# Patient Record
Sex: Female | Born: 1985
Health system: Southern US, Community
[De-identification: ages and names within clinical notes are randomized; demographics above are authoritative.]

## PROBLEM LIST (undated history)

## (undated) ENCOUNTER — Inpatient Hospital Stay (HOSPITAL_COMMUNITY): Payer: Self-pay

## (undated) DIAGNOSIS — O139 Gestational [pregnancy-induced] hypertension without significant proteinuria, unspecified trimester: Secondary | ICD-10-CM

## (undated) DIAGNOSIS — K219 Gastro-esophageal reflux disease without esophagitis: Secondary | ICD-10-CM

## (undated) DIAGNOSIS — R519 Headache, unspecified: Secondary | ICD-10-CM

## (undated) DIAGNOSIS — D649 Anemia, unspecified: Secondary | ICD-10-CM

## (undated) DIAGNOSIS — G43019 Migraine without aura, intractable, without status migrainosus: Secondary | ICD-10-CM

## (undated) DIAGNOSIS — J45909 Unspecified asthma, uncomplicated: Secondary | ICD-10-CM

## (undated) DIAGNOSIS — IMO0002 Reserved for concepts with insufficient information to code with codable children: Secondary | ICD-10-CM

## (undated) DIAGNOSIS — Z98891 History of uterine scar from previous surgery: Secondary | ICD-10-CM

## (undated) DIAGNOSIS — O24419 Gestational diabetes mellitus in pregnancy, unspecified control: Secondary | ICD-10-CM

## (undated) DIAGNOSIS — O1490 Unspecified pre-eclampsia, unspecified trimester: Secondary | ICD-10-CM

## (undated) DIAGNOSIS — M1990 Unspecified osteoarthritis, unspecified site: Secondary | ICD-10-CM

## (undated) DIAGNOSIS — E119 Type 2 diabetes mellitus without complications: Secondary | ICD-10-CM

## (undated) DIAGNOSIS — R51 Headache: Secondary | ICD-10-CM

## (undated) HISTORY — PX: WISDOM TOOTH EXTRACTION: SHX21

## (undated) HISTORY — DX: Migraine without aura, intractable, without status migrainosus: G43.019

## (undated) HISTORY — DX: Morbid (severe) obesity due to excess calories: E66.01

## (undated) HISTORY — PX: TOENAIL EXCISION: SUR558

## (undated) HISTORY — DX: Unspecified osteoarthritis, unspecified site: M19.90

## (undated) HISTORY — DX: Type 2 diabetes mellitus without complications: E11.9

---

## 2006-03-18 ENCOUNTER — Ambulatory Visit (HOSPITAL_COMMUNITY): Admission: RE | Admit: 2006-03-18 | Discharge: 2006-03-18 | Payer: Self-pay | Admitting: *Deleted

## 2006-04-29 ENCOUNTER — Ambulatory Visit (HOSPITAL_COMMUNITY): Admission: RE | Admit: 2006-04-29 | Discharge: 2006-04-29 | Payer: Self-pay | Admitting: *Deleted

## 2006-07-10 ENCOUNTER — Ambulatory Visit (HOSPITAL_COMMUNITY): Admission: RE | Admit: 2006-07-10 | Discharge: 2006-07-10 | Payer: Self-pay | Admitting: Family Medicine

## 2006-07-13 ENCOUNTER — Ambulatory Visit: Payer: Self-pay | Admitting: *Deleted

## 2006-07-13 ENCOUNTER — Inpatient Hospital Stay (HOSPITAL_COMMUNITY): Admission: AD | Admit: 2006-07-13 | Discharge: 2006-07-13 | Payer: Self-pay | Admitting: *Deleted

## 2006-07-23 ENCOUNTER — Ambulatory Visit: Payer: Self-pay | Admitting: Gynecology

## 2006-07-23 ENCOUNTER — Ambulatory Visit (HOSPITAL_COMMUNITY): Admission: RE | Admit: 2006-07-23 | Discharge: 2006-07-23 | Payer: Self-pay | Admitting: Obstetrics & Gynecology

## 2006-07-31 ENCOUNTER — Ambulatory Visit (HOSPITAL_COMMUNITY): Admission: RE | Admit: 2006-07-31 | Discharge: 2006-07-31 | Payer: Self-pay | Admitting: Gynecology

## 2006-07-31 ENCOUNTER — Ambulatory Visit: Payer: Self-pay | Admitting: Gynecology

## 2006-08-07 ENCOUNTER — Ambulatory Visit: Payer: Self-pay | Admitting: Obstetrics & Gynecology

## 2006-08-07 ENCOUNTER — Ambulatory Visit (HOSPITAL_COMMUNITY): Admission: RE | Admit: 2006-08-07 | Discharge: 2006-08-07 | Payer: Self-pay | Admitting: Obstetrics & Gynecology

## 2006-08-10 ENCOUNTER — Inpatient Hospital Stay (HOSPITAL_COMMUNITY): Admission: AD | Admit: 2006-08-10 | Discharge: 2006-08-15 | Payer: Self-pay | Admitting: Obstetrics & Gynecology

## 2006-08-10 ENCOUNTER — Ambulatory Visit: Payer: Self-pay | Admitting: Gynecology

## 2006-08-19 ENCOUNTER — Inpatient Hospital Stay (HOSPITAL_COMMUNITY): Admission: AD | Admit: 2006-08-19 | Discharge: 2006-08-19 | Payer: Self-pay | Admitting: Gynecology

## 2006-08-22 ENCOUNTER — Inpatient Hospital Stay (HOSPITAL_COMMUNITY): Admission: RE | Admit: 2006-08-22 | Discharge: 2006-08-22 | Payer: Self-pay | Admitting: Obstetrics and Gynecology

## 2006-08-28 ENCOUNTER — Ambulatory Visit: Payer: Self-pay | Admitting: Gynecology

## 2007-03-19 IMAGING — US US FETAL BPP W/O NONSTRESS
1 series · 14 of 21 positions shown · non-contrast
Comparison: none

CLINICAL DATA: Pregnancy induced hypertension.  Assess fetal well-being.

[Series 1: us fetal bpp w/o nonstress · 0.47mm/px · 21 acquisitions, 14 frames shown]
[im 1/21]
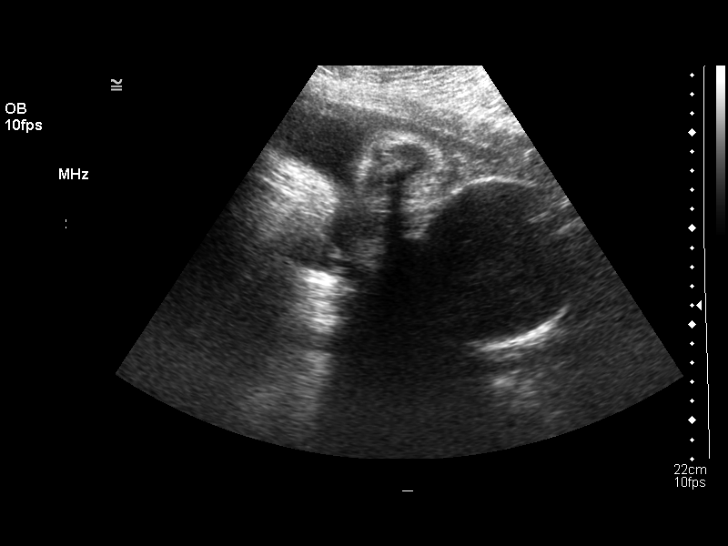
[im 3/21]
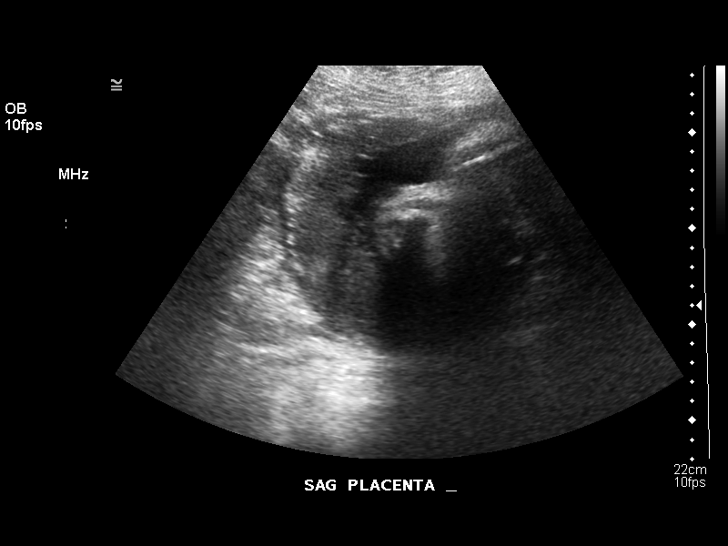
[im 4/21]
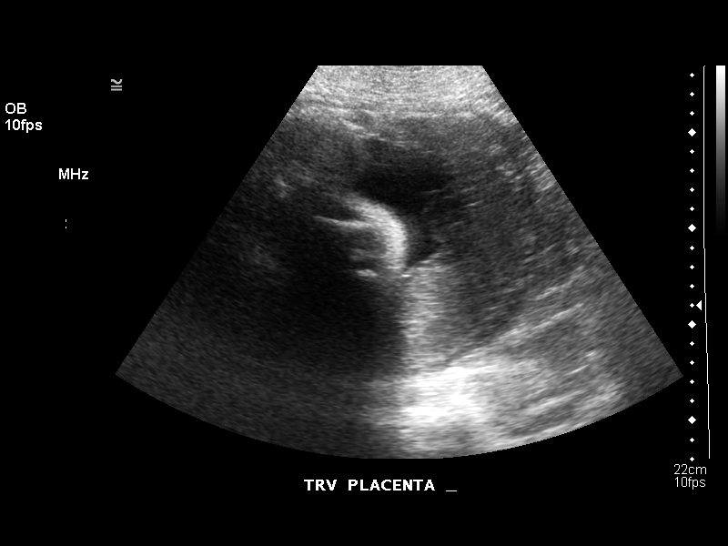
[im 6/21]
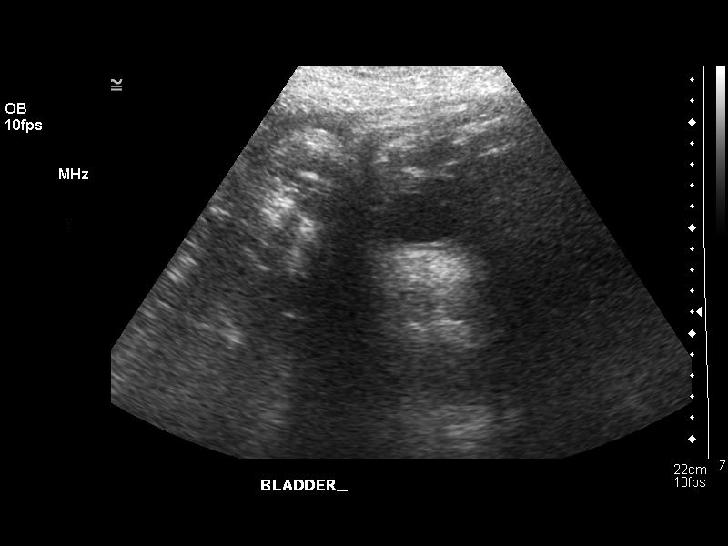
[im 7/21]
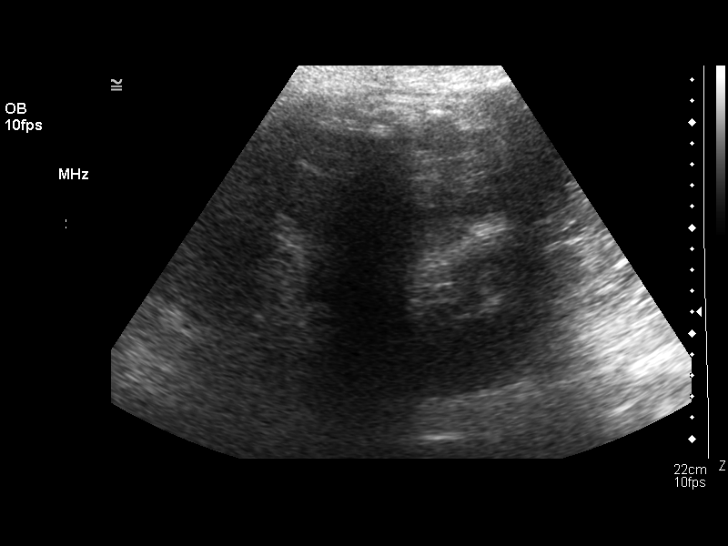
[im 9/21]
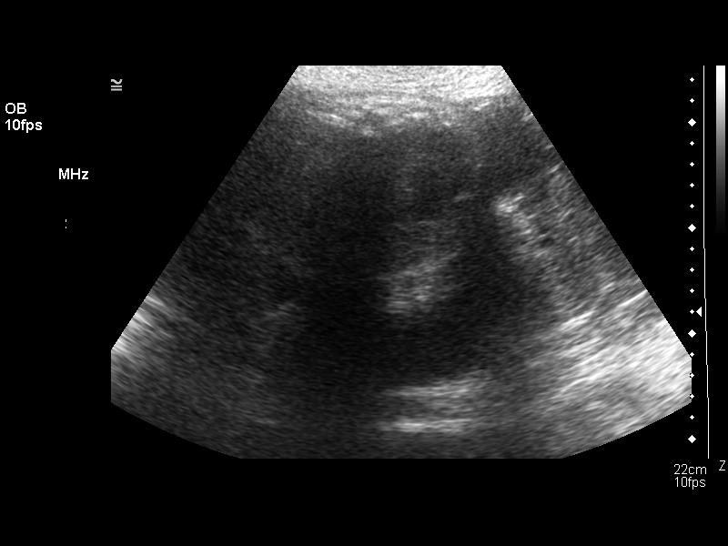
[im 10/21]
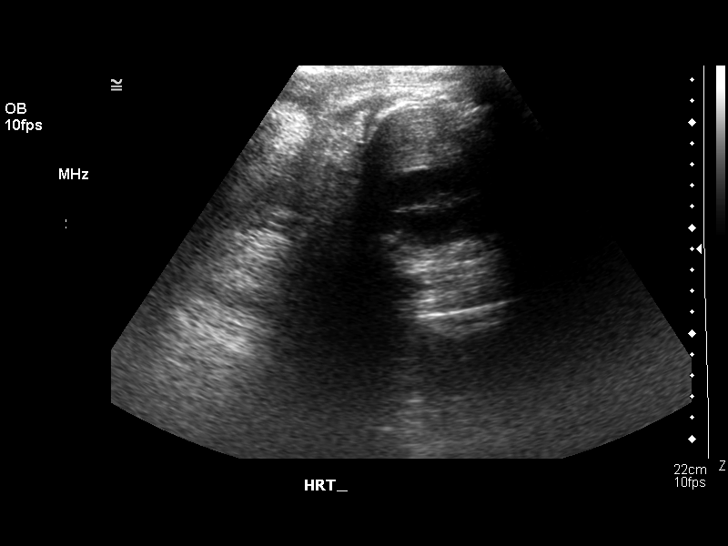
[im 12/21]
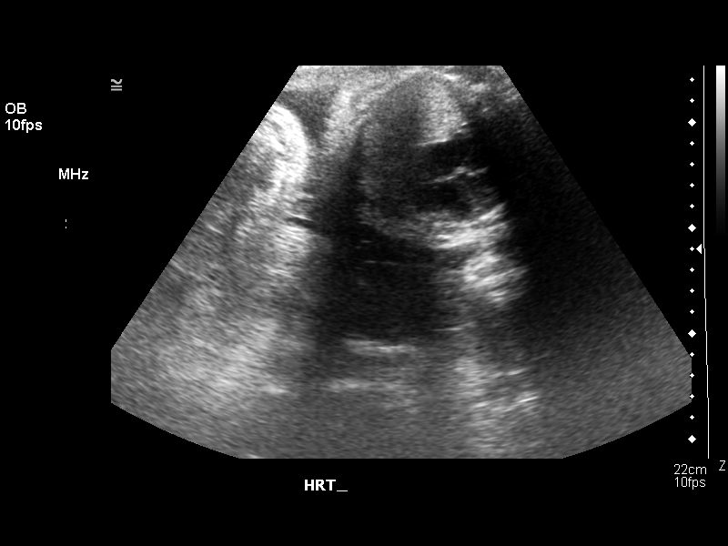
[im 13/21]
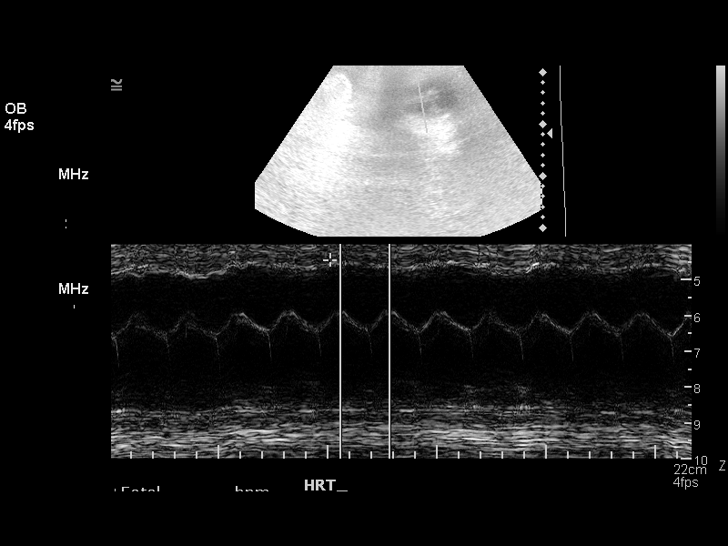
[im 15/21]
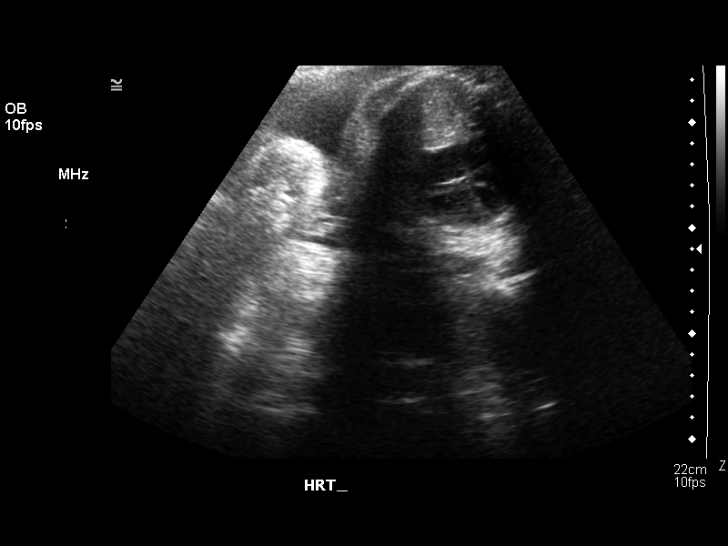
[im 16/21]
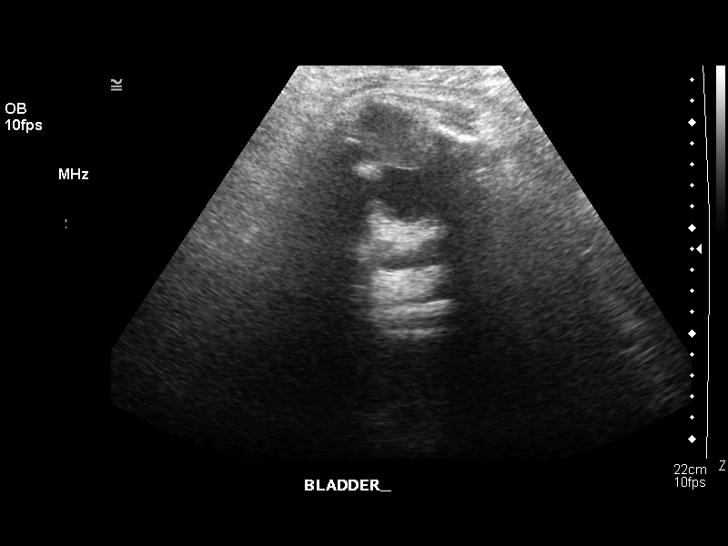
[im 18/21]
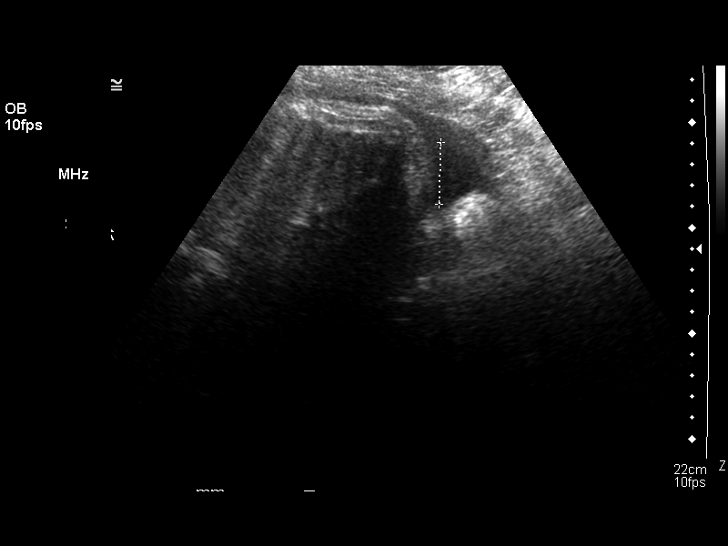
[im 19/21]
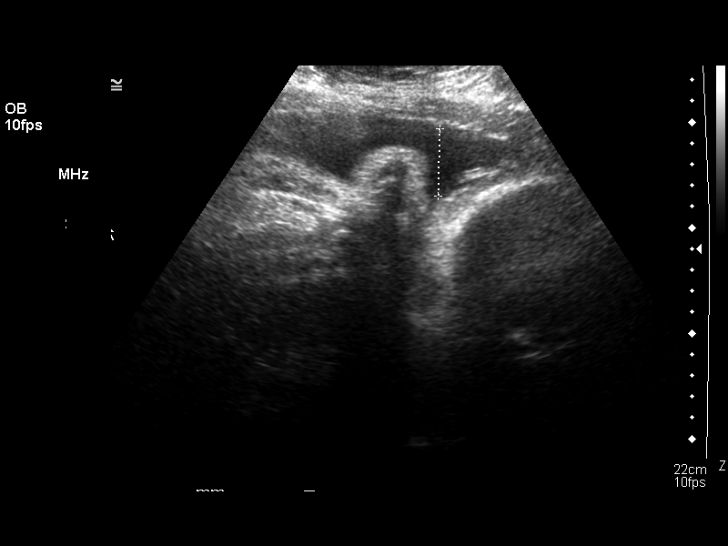
[im 21/21]
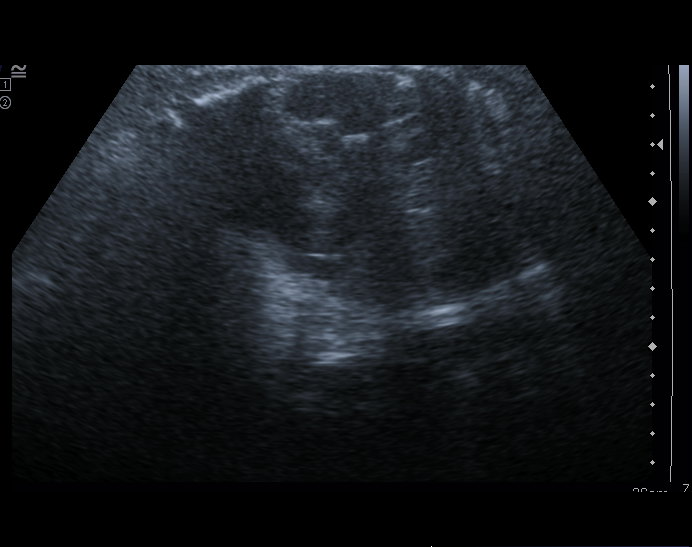

[14 of 21 positions shown; findings below may reference images not displayed]

BIOPHYSICAL PROFILE

 Number of Fetuses: 1
 Heart rate:  133
 Movement:  Yes
 Breathing:  Yes
 Presentation:  Cephalic
 Placental Location:  Fundal
 Grade:  II
 Previa: No
 Amniotic Fluid (Subjective):  Normal
 Amniotic Fluid (Objective):  16.2 cm AFI (5th -95th%ile = 7.5 ? 24.4 cm for 37 wks)

 Fetal measurements and complete anatomic evaluation were not requested.  The following fetal anatomy was visualized on this exam:  Lateral ventricles, stomach, kidneys, and bladder.

 BPP SCORING
 Movements:  2  Time:  20 minutes
 Breathing: 2
 Tone: 2
 Amniotic Fluid:  2
 Total Score:  8

 MATERNAL UTERINE AND ADNEXAL FINDINGS
 Cervix: Not evaluated.
IMPRESSION: 1.  Biophysical profile score [DATE].
 2.  Subjectively and quantitatively normal amniotic fluid volume.
 3.  No late developing fetal anatomic abnormalities are identified associated with the lateral ventricles, stomach, kidneys, or bladder.  A four chamber heart view could not be reassessed due to positioning on today?s exam.  The fetal spine which had not be seen previously could not be seen today due to positioning combined with maternal body habitus.

## 2010-07-08 ENCOUNTER — Emergency Department (HOSPITAL_COMMUNITY): Admission: EM | Admit: 2010-07-08 | Discharge: 2010-07-08 | Payer: Self-pay | Admitting: Emergency Medicine

## 2011-01-08 ENCOUNTER — Emergency Department (HOSPITAL_BASED_OUTPATIENT_CLINIC_OR_DEPARTMENT_OTHER)
Admission: EM | Admit: 2011-01-08 | Discharge: 2011-01-08 | Payer: Self-pay | Source: Home / Self Care | Admitting: Emergency Medicine

## 2011-05-16 NOTE — Discharge Summary (Signed)
NAMEMarland Kitchen  Audrey [name], Audrey [name]                ACCOUNT NO.:  [id number]   MEDICAL RECORD NO.:  [id number]          PATIENT TYPE:  INP   LOCATION:  9125                          FACILITY:  [facility]   PHYSICIAN:  Ginger Carne, MD  DATE OF BIRTH:  [birth year]   DATE OF ADMISSION:  08/10/2006  DATE OF DISCHARGE:  08/15/2006                                 DISCHARGE SUMMARY   HISTORY OF PRESENT ILLNESS:  This is a 25 year old gravida 1 who was  admitted at 39 weeks for induction of labor secondary to pregnancy-induced  hypertension.  She also has a diagnosis of morbid obesity and probable  chronic mild hypertension.  She had a normal pregnancy course; however, at  39 weeks she presented with headache, spots before her eyes and some  edema.  She was begun on magnesium sulfate at 3 gm an hour and serial  induction was begun.  However, after two and a half days, the patient never  progressed pass 3 to 4 cm and the decision was made to proceed with a  primary cesarean section.  The patient did undergo a low transverse cesarean  section on the 15th by Dr. Blima Rich.  Complications were some apnea.  Estimated blood loss 800 mL.  She was delivered of a female.  Apgar's were 7  and 9.  Weight was 7 pounds and 5 ounces.  The patient did plan an IUD at  her 6-week checkup and was bottle feeding.   During her hospital course, she was begun on Toprol and HCTZ.   DISCHARGE MEDICATIONS:  1. Vicodin 1-2 p.o. q.4-6h. p.r.n. pain.  2. Ibuprofen 800 mg p.o. q.8h. for pain.  3. Toprol 25 mg 1 p.o. q.a.m.  4. HCTZ 25 mg 1 p.o. q.a.m.  5. Prenatal vitamin once a day.  6. Colace b.i.d. for 6 weeks.   On postoperative day #3, the patient was ambulating without difficulty and  had no complications.  Her discharge instructions including no driving or  heavy lifting for two weeks.  Nothing in the vagina for six weeks.  She is  to report any temperature greater than 100.5 or any problems with her  incision.  She was  to return to have MAU to have her staples removed in  three days and have a blood pressure checked at that time.   FINAL DIAGNOSES:  1. Term pregnancy delivered.  2. Pregnancy-induced hypertension.  3. Failed induction and status post primary low transverse cesarean      section.  4. AB positive blood type.  5. Immune to Rubella.     ______________________________  Jeb Levering, C.N.M.      Ginger Carne, MD  Electronically Signed    [place]/MEDQ  D:  09/30/2006  T:  10/01/2006  Job:  [id number]

## 2011-05-16 NOTE — Op Note (Signed)
NAME:  Audrey [name], Audrey [name]                ACCOUNT NO.:  [id number]   MEDICAL RECORD NO.:  [id number]          PATIENT TYPE:  INP   LOCATION:                                FACILITY:  [facility]   PHYSICIAN:  Ginger Carne, MD  DATE OF BIRTH:  06/07/1986   DATE OF PROCEDURE:  08/12/2006  DATE OF DISCHARGE:                                 OPERATIVE REPORT   PREOPERATIVE DIAGNOSIS:  Failure to progress first stage of labor.   POSTOPERATIVE DIAGNOSES:  1. Failure to progress first stage of labor.  2. Term viable delivery of female infant.   PROCEDURE:  Primary low transverse Cesarean section.   SURGEON:  Ginger Carne, MD.   ASSISTANT:  None.   COMPLICATIONS:  Uterine atony.   ESTIMATED BLOOD LOSS:  800 ml.   ANESTHESIA:  Epidural top off.   SPECIMENS:  Cord bloods.   OPERATIVE FINDINGS:  A term infant female delivered in the vertex  presentation at 2309 hours, Apgar's were 7 and 9, weight 7 pounds, 5 ounces,  no gross abnormalities, baby cried spontaneously at delivery, amniotic fluid  was clear.  The uterus, tubes, and ovaries showed normal decidual changes of  pregnancy.  The placenta was complete, three-vessel cord central insertion,  uterine atony noted despite efforts at control including massage and  Hemabate within the intramural portion of the uterus.  A meatloaf suture  consisting of a series of #0 Vicryl sutures placed anteriorly and  posteriorly, at one side of the uterus and then emanating from the other  side with ties in such a manner as to create a relatively flat anterior and  posterior plane.  No active bleeding noted following this.  Bleeding points  hemostatically checked, blood clots removed, closed the parietal peritoneum  and fascia in one layer with #0 Vicryl suture, #3-0 Vicryl for the  subcutaneous layer, and skin staples for the skin.  Instrument count and  sponge count were correct.  The patient tolerated the procedure well and  returned to the post  anesthesia recovery room in excellent condition.      Ginger Carne, MD  Electronically Signed    [facility]/MEDQ  D:  08/12/2006  T:  08/13/2006  Job:  [id number]

## 2012-03-02 ENCOUNTER — Encounter (HOSPITAL_BASED_OUTPATIENT_CLINIC_OR_DEPARTMENT_OTHER): Payer: Self-pay

## 2012-03-02 ENCOUNTER — Emergency Department (HOSPITAL_BASED_OUTPATIENT_CLINIC_OR_DEPARTMENT_OTHER)
Admission: EM | Admit: 2012-03-02 | Discharge: 2012-03-02 | Disposition: A | Discharge status: Home Health | Payer: Self-pay | Attending: Emergency Medicine | Admitting: Emergency Medicine

## 2012-03-02 DIAGNOSIS — R109 Unspecified abdominal pain: Secondary | ICD-10-CM | POA: Insufficient documentation

## 2012-03-02 DIAGNOSIS — R110 Nausea: Secondary | ICD-10-CM | POA: Insufficient documentation

## 2012-03-02 DIAGNOSIS — R197 Diarrhea, unspecified: Secondary | ICD-10-CM | POA: Insufficient documentation

## 2012-03-02 LAB — URINALYSIS, ROUTINE W REFLEX MICROSCOPIC
Bilirubin Urine: NEGATIVE
Leukocytes, UA: NEGATIVE
Nitrite: NEGATIVE
Protein, ur: NEGATIVE mg/dL
Specific Gravity, Urine: 1.024 (ref 1.005–1.030)

## 2012-03-02 LAB — URINE MICROSCOPIC-ADD ON

## 2012-03-02 MED ORDER — ONDANSETRON 4 MG PO TBDP
4.0000 mg | ORAL_TABLET | Freq: Once | ORAL | Status: AC
  Administered 2012-03-02: 4 mg via ORAL
  Filled 2012-03-02: qty 1

## 2012-03-02 MED ORDER — TRAMADOL HCL 50 MG PO TABS
50.0000 mg | ORAL_TABLET | Freq: Once | ORAL | Status: AC
  Administered 2012-03-02: 50 mg via ORAL

## 2012-03-02 MED ORDER — TRAMADOL HCL 50 MG PO TABS
ORAL_TABLET | ORAL | Status: AC
  Administered 2012-03-02: 04:00:00
  Filled 2012-03-02: qty 1

## 2012-03-02 NOTE — ED Provider Notes (Signed)
History     CSN: [id number]  Arrival date & time 03/02/12  0144   First MD Initiated Contact with Patient 03/02/12 0215      Chief Complaint  Patient presents with  . Abdominal Pain    (Consider location/radiation/quality/duration/timing/severity/associated sxs/prior treatment) Patient is a 26 y.o. female presenting with diarrhea. The history is provided by the patient. No language interpreter was used.  Diarrhea The primary symptoms include nausea and diarrhea. Primary symptoms do not include fever, weight loss, fatigue, vomiting, myalgias, arthralgias or rash. The illness began yesterday. The onset was sudden. The problem has not changed since onset. The illness does not include chills, anorexia, bloating or tenesmus. Associated medical issues do not include diverticulitis. Risk factors: child ill and in school.    History reviewed. No pertinent past medical history.  Past Surgical History  Procedure Date  . Cesarean section     No family history on file.  History  Substance Use Topics  . Smoking status: Not on file  . Smokeless tobacco: Not on file  . Alcohol Use:     OB History    Grav Para Term Preterm Abortions TAB SAB Ect Mult Living                  Review of Systems  Constitutional: Negative for fever, chills, weight loss and fatigue.  HENT: Negative.   Eyes: Negative.   Respiratory: Negative for shortness of breath.   Cardiovascular: Negative for chest pain.  Gastrointestinal: Positive for nausea and diarrhea. Negative for vomiting, abdominal distention, bloating and anorexia.  Genitourinary: Negative for difficulty urinating.  Musculoskeletal: Negative for myalgias and arthralgias.  Skin: Negative for rash.  Neurological: Negative.   Hematological: Negative.   Psychiatric/Behavioral: Negative.     Allergies  Review of patient's allergies indicates no known allergies.  Home Medications  No current outpatient prescriptions on file.  BP 142/88   Pulse 103  Temp(Src) 98.1 F (36.7 C) (Oral)  SpO2 100%  LMP 01/03/2012  Physical Exam  Constitutional: She is oriented to person, place, and time. She appears well-developed and well-nourished. No distress.  HENT:  Head: Normocephalic and atraumatic.  Mouth/Throat: Oropharynx is clear and moist.  Eyes: Conjunctivae are normal. Pupils are equal, round, and reactive to light.  Neck: Normal range of motion. Neck supple.  Cardiovascular: Normal rate and regular rhythm.   Pulmonary/Chest: Effort normal and breath sounds normal.  Abdominal: Soft. Bowel sounds are normal. There is no tenderness. There is no rebound and no guarding.  Musculoskeletal: Normal range of motion.  Lymphadenopathy:    She has no cervical adenopathy.  Neurological: She is alert and oriented to person, place, and time.  Skin: Skin is warm and dry.  Psychiatric: She has a normal mood and affect.    ED Course  Procedures (including critical care time)  Labs Reviewed  URINALYSIS, ROUTINE W REFLEX MICROSCOPIC - Abnormal; Notable for the following:    APPearance CLOUDY (*)    Hgb urine dipstick SMALL (*)    All other components within normal limits  URINE MICROSCOPIC-ADD ON - Abnormal; Notable for the following:    Squamous Epithelial / LPF MANY (*)    Bacteria, UA MANY (*)    All other components within normal limits  PREGNANCY, URINE   No results found.   No diagnosis found.    MDM  Abdominal exam benign and reassuring.  No fever.  Positive sick contacts with same.  Will treat symptomatically no indication for imaging  at this time.  Return for fevers chills intractable pain.  Patient verbalizes understanding        Nashaun Hillmer K Lizeth Bencosme-Rasch, MD 03/02/12 [phone]

## 2012-03-02 NOTE — Discharge Instructions (Signed)
B.R.A.T. Diet Your doctor has recommended the B.R.A.T. diet for you or your child until the condition improves. This is often used to help control diarrhea and vomiting symptoms. If you or your child can tolerate clear liquids, you may have:  Bananas.   Rice.   Applesauce.   Toast (and other simple starches such as crackers, potatoes, noodles).  Be sure to avoid dairy products, meats, and fatty foods until symptoms are better. Fruit juices such as apple, grape, and prune juice can make diarrhea worse. Avoid these. Continue this diet for 2 days or as instructed by your caregiver. Document Released: 12/15/2005 Document Revised: 12/04/2011 Document Reviewed: 06/03/2007 Exit[employer] Patient Information 2012 [employer]. 

## 2012-03-02 NOTE — ED Notes (Signed)
Pt sts stomach lower hurting since yesterday ,8/10 aching,N/D

## 2012-12-06 ENCOUNTER — Ambulatory Visit: Payer: Self-pay | Admitting: *Deleted

## 2013-01-27 ENCOUNTER — Other Ambulatory Visit: Payer: Self-pay

## 2013-01-28 ENCOUNTER — Other Ambulatory Visit (HOSPITAL_COMMUNITY): Payer: Self-pay | Admitting: Obstetrics and Gynecology

## 2013-01-28 DIAGNOSIS — O99210 Obesity complicating pregnancy, unspecified trimester: Secondary | ICD-10-CM

## 2013-01-28 DIAGNOSIS — O26849 Uterine size-date discrepancy, unspecified trimester: Secondary | ICD-10-CM

## 2013-01-28 DIAGNOSIS — Z3689 Encounter for other specified antenatal screening: Secondary | ICD-10-CM

## 2013-02-10 ENCOUNTER — Other Ambulatory Visit (HOSPITAL_COMMUNITY): Payer: Medicaid Other

## 2013-02-14 ENCOUNTER — Encounter (HOSPITAL_COMMUNITY): Payer: Self-pay

## 2013-02-14 ENCOUNTER — Ambulatory Visit (HOSPITAL_COMMUNITY)
Admission: RE | Admit: 2013-02-14 | Discharge: 2013-02-14 | Disposition: A | Discharge status: Home / Self Care | Payer: Medicaid Other | Source: Ambulatory Visit | Attending: Obstetrics and Gynecology | Admitting: Obstetrics and Gynecology

## 2013-02-14 DIAGNOSIS — Z1389 Encounter for screening for other disorder: Secondary | ICD-10-CM | POA: Insufficient documentation

## 2013-02-14 DIAGNOSIS — O26849 Uterine size-date discrepancy, unspecified trimester: Secondary | ICD-10-CM

## 2013-02-14 DIAGNOSIS — O365990 Maternal care for other known or suspected poor fetal growth, unspecified trimester, not applicable or unspecified: Secondary | ICD-10-CM | POA: Insufficient documentation

## 2013-02-14 DIAGNOSIS — Z3689 Encounter for other specified antenatal screening: Secondary | ICD-10-CM

## 2013-02-14 DIAGNOSIS — O09299 Supervision of pregnancy with other poor reproductive or obstetric history, unspecified trimester: Secondary | ICD-10-CM | POA: Insufficient documentation

## 2013-02-14 DIAGNOSIS — O358XX0 Maternal care for other (suspected) fetal abnormality and damage, not applicable or unspecified: Secondary | ICD-10-CM | POA: Insufficient documentation

## 2013-02-14 DIAGNOSIS — O99210 Obesity complicating pregnancy, unspecified trimester: Secondary | ICD-10-CM

## 2013-02-14 DIAGNOSIS — O34219 Maternal care for unspecified type scar from previous cesarean delivery: Secondary | ICD-10-CM | POA: Insufficient documentation

## 2013-02-14 DIAGNOSIS — Z363 Encounter for antenatal screening for malformations: Secondary | ICD-10-CM | POA: Insufficient documentation

## 2013-02-14 NOTE — Progress Notes (Signed)
Audrey [name]  was seen today for an ultrasound appointment.  See full report in [facility].  Comments Audrey [name] was seen today due to suspected lagging fetal growth by clinic ultrasound.  The patient has a history of preeclampsia at term s/p C-section for non reassuring fetal tracing.    Impression Single IUP at 24 1/7 weeks Normal fetal anatomic survey.  Somewhat limited views of the fetal heart obtained (aortic arch) The estimated fetal weight today is at the 42nd %tile Normal amniotic fluid volume  Plan Recommend follow-up ultrasound examination in 4 weeks for interval growth  Alpha Gula, MD

## 2013-03-11 ENCOUNTER — Other Ambulatory Visit (HOSPITAL_COMMUNITY): Payer: Self-pay | Admitting: Maternal and Fetal Medicine

## 2013-03-11 DIAGNOSIS — O365991 Maternal care for other known or suspected poor fetal growth, unspecified trimester, fetus 1: Secondary | ICD-10-CM

## 2013-03-14 ENCOUNTER — Ambulatory Visit (HOSPITAL_COMMUNITY)
Admission: RE | Admit: 2013-03-14 | Discharge: 2013-03-14 | Disposition: A | Discharge status: Home / Self Care | Payer: Medicaid Other | Source: Ambulatory Visit | Attending: Obstetrics and Gynecology | Admitting: Obstetrics and Gynecology

## 2013-03-14 VITALS — BP 129/79 | HR 110 | Wt 312.0 lb

## 2013-03-14 DIAGNOSIS — O365990 Maternal care for other known or suspected poor fetal growth, unspecified trimester, not applicable or unspecified: Secondary | ICD-10-CM | POA: Insufficient documentation

## 2013-03-14 DIAGNOSIS — O34219 Maternal care for unspecified type scar from previous cesarean delivery: Secondary | ICD-10-CM | POA: Insufficient documentation

## 2013-03-14 DIAGNOSIS — O09299 Supervision of pregnancy with other poor reproductive or obstetric history, unspecified trimester: Secondary | ICD-10-CM | POA: Insufficient documentation

## 2013-03-14 DIAGNOSIS — O99810 Abnormal glucose complicating pregnancy: Secondary | ICD-10-CM | POA: Insufficient documentation

## 2013-03-14 DIAGNOSIS — O365991 Maternal care for other known or suspected poor fetal growth, unspecified trimester, fetus 1: Secondary | ICD-10-CM

## 2013-03-14 NOTE — Progress Notes (Signed)
Audrey [name]  was seen today for an ultrasound appointment.  See full report in [facility].  Impression: Single IUP at 29 0/7 weeks Interval fetal growth is appropriate (44th %tile) Normal interval anatomy - anatomic fetal survey is now complete Normal amniotic fluid volume  Recommendations: Recommend follow-up ultrasound examination in 4 weeks for interval growth.  Alpha Gula, MD

## 2013-03-15 NOTE — Addendum Note (Signed)
Encounter addended by: Ty Hilts, RN on: 03/15/2013  3:30 PM<BR>     Documentation filed: Episodes, Charges VN, OB Prenatal Vitals

## 2013-03-15 NOTE — Addendum Note (Signed)
Encounter addended by: Ty Hilts, RN on: 03/15/2013  3:31 PM<BR>     Documentation filed: ZO Prenatal Vitals

## 2013-03-20 ENCOUNTER — Encounter (HOSPITAL_COMMUNITY): Payer: Self-pay | Admitting: Family

## 2013-03-20 ENCOUNTER — Inpatient Hospital Stay (HOSPITAL_COMMUNITY)
Admission: AD | Admit: 2013-03-20 | Discharge: 2013-03-20 | Disposition: A | Discharge status: Home / Self Care | Payer: Medicaid Other | Source: Ambulatory Visit | Attending: Obstetrics and Gynecology | Admitting: Obstetrics and Gynecology

## 2013-03-20 DIAGNOSIS — L0231 Cutaneous abscess of buttock: Secondary | ICD-10-CM

## 2013-03-20 DIAGNOSIS — L03317 Cellulitis of buttock: Secondary | ICD-10-CM

## 2013-03-20 DIAGNOSIS — O99891 Other specified diseases and conditions complicating pregnancy: Secondary | ICD-10-CM | POA: Insufficient documentation

## 2013-03-20 DIAGNOSIS — L0291 Cutaneous abscess, unspecified: Secondary | ICD-10-CM | POA: Insufficient documentation

## 2013-03-20 HISTORY — DX: Unspecified pre-eclampsia, unspecified trimester: O14.90

## 2013-03-20 MED ORDER — CEPHALEXIN 500 MG PO CAPS: 500.0000 mg | ORAL_CAPSULE | Freq: Three times a day (TID) | ORAL | Status: DC

## 2013-03-20 MED ORDER — CEFTRIAXONE SODIUM 250 MG IJ SOLR
250.0000 mg | Freq: Once | INTRAMUSCULAR | Status: AC
  Administered 2013-03-20: 250 mg via INTRAMUSCULAR
  Filled 2013-03-20: qty 250

## 2013-03-20 NOTE — MAU Note (Addendum)
Patient presents to MAU with c/o boils on both sides of gluteal crease for 2 weeks. Is having to sit with pillows. Denies fevers, chills, or MRSA hx.  Reports good fetal movement; denies vaginal bleeding, LOF or cramping.

## 2013-03-20 NOTE — MAU Provider Note (Signed)
History     CSN: [id number]  Arrival date and time: 03/20/13 1407   First Provider Initiated Contact with Patient 03/20/13 [phone]      Chief Complaint  Patient presents with  . Recurrent Skin Infections   HPI Audrey [name] 27 y.o. [redacted]w[redacted]d   Comes to MAU with pain in gluteal fold.  Has had for several day.  Called the office and was told to use warm soak.  Client reports she has used the warm soaks and there has been no change.  Area is getting more painful.  Can only sit with pillows and can hardly sit.  Is currently lying on her side.  Afebrile.  OB History   Grav Para Term Preterm Abortions TAB SAB Ect Mult Living   2 1 1  0 0 0 0 0 0 1      Past Medical History  Diagnosis Date  . Preeclampsia     Past Surgical History  Procedure Laterality Date  . Cesarean section    . Toenail excision    . Wisdom tooth extraction      History reviewed. No pertinent family history.  History  Substance Use Topics  . Smoking status: Never Smoker   . Smokeless tobacco: Never Used  . Alcohol Use: No    Allergies: No Known Allergies  Prescriptions prior to admission  Medication Sig Dispense Refill  . butalbital-acetaminophen-caffeine (FIORICET, ESGIC) 50-325-40 MG per tablet Take 1 tablet by mouth 2 (two) times daily as needed for headache.      . calcium carbonate (TUMS - DOSED IN MG ELEMENTAL CALCIUM) 500 MG chewable tablet Chew 1 tablet by mouth daily as needed for heartburn.      . Doxylamine-Pyridoxine (DICLEGIS) 10-10 MG TBEC Take 1 tablet by mouth 4 (four) times daily as needed (nausea).      . famotidine (PEPCID AC) 10 MG chewable tablet Chew 10 mg by mouth 2 (two) times daily as needed for heartburn.        Review of Systems  Constitutional: Negative for fever.  Gastrointestinal: Negative for nausea, vomiting, abdominal pain and diarrhea.  Genitourinary:       Painful swelling between buttocks No vaginal discharge. No vaginal bleeding. No dysuria.   Physical Exam    Blood pressure 102/50, pulse 110, temperature 99.7 F (37.6 C), temperature source Oral, resp. rate 18, height 5' 7.5" (1.715 m), weight 310 lb (140.615 kg), last menstrual period 08/23/2012.  Physical Exam  Nursing note and vitals reviewed. Constitutional: She is oriented to person, place, and time. She appears well-developed and well-nourished.  Morbidly obese Lying on her side  HENT:  Head: Normocephalic.  Eyes: EOM are normal.  Neck: Neck supple.  Genitourinary:  Pink tissue noted on both sides of entire gluteal fold.  Slight swelling noted - irregular and assymetrical.  Firm texture and tender to palpation.  No area of pointing or potential drainage seen.  Musculoskeletal: Normal range of motion.  Neurological: She is alert and oriented to person, place, and time.  Skin: Skin is warm and dry.  Psychiatric: She has a normal mood and affect.    MAU Course  Procedures  MDM Given location would consider pilonidal cyst vs abscess.  Unable to drain today due to firmness of tissue 1520  Consult with Dr. Senaida Ores - Rocephin 250 mg IM and Rx for Keflex  Assessment and Plan  Cellulitis  Plan: Rx Keflex 500 mg PO TID x 7 days Warm soaks 3-4 times a day. Be seen  in the office in 3 days if you have fever or there is no improvement or if area begins to drain.   [employer] 03/20/2013, 3:26 PM

## 2013-03-23 ENCOUNTER — Encounter: Payer: Medicaid Other | Attending: Obstetrics and Gynecology | Admitting: *Deleted

## 2013-03-23 VITALS — Ht 67.5 in | Wt 313.6 lb

## 2013-03-23 DIAGNOSIS — Z713 Dietary counseling and surveillance: Secondary | ICD-10-CM | POA: Insufficient documentation

## 2013-03-23 DIAGNOSIS — O99810 Abnormal glucose complicating pregnancy: Secondary | ICD-10-CM | POA: Insufficient documentation

## 2013-03-24 ENCOUNTER — Encounter: Payer: Self-pay | Admitting: *Deleted

## 2013-03-24 NOTE — Patient Instructions (Signed)
Goals:  Check glucose levels per MD as instructed  Follow Gestational Diabetes Diet as instructed  Call for follow-up as needed    

## 2013-03-24 NOTE — Progress Notes (Signed)
  Patient was seen on 03/23/13 for Gestational Diabetes self-management class at the [facility]. The following learning objectives were met by the patient during this course:   States the definition of Gestational Diabetes  States why dietary management is important in controlling blood glucose  Describes the effects each nutrient has on blood glucose levels  Demonstrates ability to create a balanced meal plan  Demonstrates carbohydrate counting   States when to check blood glucose levels  Demonstrates proper blood glucose monitoring techniques  States the effect of stress and exercise on blood glucose levels  States the importance of limiting caffeine and abstaining from alcohol and smoking  Blood glucose monitor given: Accu Chek Nano BG Monitoring Kit Lot # [id number] Exp: 04/27/14 Blood glucose reading: 67 mg/dl  Patient instructed to monitor glucose levels: FBS: 60 - <90 1 hour: <140 2 hour: <120  *Patient received handouts:  Nutrition Diabetes and Pregnancy  Carbohydrate Counting List  Patient will be seen for follow-up as needed.

## 2013-04-08 ENCOUNTER — Encounter: Payer: Self-pay | Admitting: Obstetrics and Gynecology

## 2013-04-13 ENCOUNTER — Ambulatory Visit (HOSPITAL_COMMUNITY)
Admission: RE | Admit: 2013-04-13 | Discharge: 2013-04-13 | Disposition: A | Discharge status: Home / Self Care | Payer: Medicaid Other | Source: Ambulatory Visit | Attending: Obstetrics and Gynecology | Admitting: Obstetrics and Gynecology

## 2013-04-13 VITALS — BP 125/78 | HR 100 | Wt 313.0 lb

## 2013-04-13 DIAGNOSIS — O365991 Maternal care for other known or suspected poor fetal growth, unspecified trimester, fetus 1: Secondary | ICD-10-CM

## 2013-04-13 DIAGNOSIS — O34219 Maternal care for unspecified type scar from previous cesarean delivery: Secondary | ICD-10-CM | POA: Insufficient documentation

## 2013-04-13 DIAGNOSIS — O99810 Abnormal glucose complicating pregnancy: Secondary | ICD-10-CM | POA: Insufficient documentation

## 2013-04-13 DIAGNOSIS — E669 Obesity, unspecified: Secondary | ICD-10-CM | POA: Insufficient documentation

## 2013-04-13 DIAGNOSIS — O99210 Obesity complicating pregnancy, unspecified trimester: Secondary | ICD-10-CM

## 2013-04-13 DIAGNOSIS — O09299 Supervision of pregnancy with other poor reproductive or obstetric history, unspecified trimester: Secondary | ICD-10-CM | POA: Insufficient documentation

## 2013-04-27 ENCOUNTER — Inpatient Hospital Stay (HOSPITAL_COMMUNITY)
Admission: AD | Admit: 2013-04-27 | Discharge: 2013-04-27 | Disposition: A | Discharge status: Home / Self Care | Payer: Medicaid Other | Source: Ambulatory Visit | Attending: Obstetrics and Gynecology | Admitting: Obstetrics and Gynecology

## 2013-04-27 ENCOUNTER — Other Ambulatory Visit: Payer: Self-pay | Admitting: Obstetrics and Gynecology

## 2013-04-27 DIAGNOSIS — L03317 Cellulitis of buttock: Secondary | ICD-10-CM | POA: Insufficient documentation

## 2013-04-27 DIAGNOSIS — O99891 Other specified diseases and conditions complicating pregnancy: Secondary | ICD-10-CM | POA: Insufficient documentation

## 2013-04-27 DIAGNOSIS — L0231 Cutaneous abscess of buttock: Secondary | ICD-10-CM | POA: Insufficient documentation

## 2013-04-27 MED ORDER — DEXTROSE 5 % IV SOLN
2.0000 g | Freq: Once | INTRAVENOUS | Status: AC
  Administered 2013-04-27: 2 g via INTRAVENOUS
  Filled 2013-04-27: qty 2

## 2013-04-27 MED ORDER — HYDROCODONE-ACETAMINOPHEN 5-325 MG PO TABS
2.0000 | ORAL_TABLET | Freq: Once | ORAL | Status: AC
  Administered 2013-04-27: 2 via ORAL
  Filled 2013-04-27: qty 2

## 2013-04-27 NOTE — MAU Note (Signed)
Patient here for IV antibiotics only. Patient will be given medication in BS room 160. Spoke with CN.

## 2013-04-29 ENCOUNTER — Encounter (INDEPENDENT_AMBULATORY_CARE_PROVIDER_SITE_OTHER): Payer: Medicaid Other | Admitting: Surgery

## 2013-05-06 ENCOUNTER — Encounter (INDEPENDENT_AMBULATORY_CARE_PROVIDER_SITE_OTHER): Payer: Medicaid Other | Admitting: General Surgery

## 2013-05-11 ENCOUNTER — Ambulatory Visit (HOSPITAL_COMMUNITY)
Admission: RE | Admit: 2013-05-11 | Discharge: 2013-05-11 | Disposition: A | Discharge status: Home / Self Care | Payer: Medicaid Other | Source: Ambulatory Visit | Attending: Obstetrics and Gynecology | Admitting: Obstetrics and Gynecology

## 2013-05-11 DIAGNOSIS — O99810 Abnormal glucose complicating pregnancy: Secondary | ICD-10-CM | POA: Insufficient documentation

## 2013-05-11 DIAGNOSIS — O34219 Maternal care for unspecified type scar from previous cesarean delivery: Secondary | ICD-10-CM | POA: Insufficient documentation

## 2013-05-11 DIAGNOSIS — E669 Obesity, unspecified: Secondary | ICD-10-CM | POA: Insufficient documentation

## 2013-05-11 DIAGNOSIS — O99210 Obesity complicating pregnancy, unspecified trimester: Secondary | ICD-10-CM

## 2013-05-11 DIAGNOSIS — O09299 Supervision of pregnancy with other poor reproductive or obstetric history, unspecified trimester: Secondary | ICD-10-CM | POA: Insufficient documentation

## 2013-05-11 DIAGNOSIS — O365991 Maternal care for other known or suspected poor fetal growth, unspecified trimester, fetus 1: Secondary | ICD-10-CM

## 2013-05-16 ENCOUNTER — Encounter (HOSPITAL_COMMUNITY): Payer: Self-pay | Admitting: Pharmacist

## 2013-05-19 ENCOUNTER — Inpatient Hospital Stay (HOSPITAL_COMMUNITY): Payer: Medicaid Other

## 2013-05-19 ENCOUNTER — Encounter (HOSPITAL_COMMUNITY): Payer: Self-pay

## 2013-05-19 ENCOUNTER — Encounter (HOSPITAL_COMMUNITY): Payer: Self-pay | Admitting: Anesthesiology

## 2013-05-19 ENCOUNTER — Encounter (HOSPITAL_COMMUNITY)
Admission: AD | Disposition: A | Discharge status: Home / Self Care | Payer: Self-pay | Source: Ambulatory Visit | Attending: Obstetrics and Gynecology

## 2013-05-19 ENCOUNTER — Inpatient Hospital Stay (HOSPITAL_COMMUNITY)
Admission: AD | Admit: 2013-05-19 | Discharge: 2013-05-22 | DRG: 766 | Disposition: A | Discharge status: Home / Self Care | Payer: Medicaid Other | Source: Ambulatory Visit | Attending: Obstetrics and Gynecology | Admitting: Obstetrics and Gynecology

## 2013-05-19 ENCOUNTER — Inpatient Hospital Stay (HOSPITAL_COMMUNITY): Payer: Medicaid Other | Admitting: Anesthesiology

## 2013-05-19 DIAGNOSIS — O364XX0 Maternal care for intrauterine death, not applicable or unspecified: Principal | ICD-10-CM | POA: Diagnosis present

## 2013-05-19 DIAGNOSIS — O99814 Abnormal glucose complicating childbirth: Secondary | ICD-10-CM | POA: Diagnosis present

## 2013-05-19 DIAGNOSIS — Z22330 Carrier of Group B streptococcus: Secondary | ICD-10-CM

## 2013-05-19 DIAGNOSIS — Z98891 History of uterine scar from previous surgery: Secondary | ICD-10-CM

## 2013-05-19 DIAGNOSIS — O24419 Gestational diabetes mellitus in pregnancy, unspecified control: Secondary | ICD-10-CM

## 2013-05-19 DIAGNOSIS — IMO0002 Reserved for concepts with insufficient information to code with codable children: Secondary | ICD-10-CM

## 2013-05-19 DIAGNOSIS — O34219 Maternal care for unspecified type scar from previous cesarean delivery: Secondary | ICD-10-CM | POA: Diagnosis present

## 2013-05-19 DIAGNOSIS — O99892 Other specified diseases and conditions complicating childbirth: Secondary | ICD-10-CM | POA: Diagnosis present

## 2013-05-19 HISTORY — DX: Gestational diabetes mellitus in pregnancy, unspecified control: O24.419

## 2013-05-19 HISTORY — DX: Reserved for concepts with insufficient information to code with codable children: IMO0002

## 2013-05-19 HISTORY — DX: History of uterine scar from previous surgery: Z98.891

## 2013-05-19 HISTORY — DX: Gestational (pregnancy-induced) hypertension without significant proteinuria, unspecified trimester: O13.9

## 2013-05-19 LAB — CBC
HCT: 32.3 % — ABNORMAL LOW (ref 36.0–46.0)
MCV: 87.3 fL (ref 78.0–100.0)
Platelets: 316 10*3/uL (ref 150–400)
RBC: 3.7 MIL/uL — ABNORMAL LOW (ref 3.87–5.11)
RDW: 16.5 % — ABNORMAL HIGH (ref 11.5–15.5)
WBC: 13.7 10*3/uL — ABNORMAL HIGH (ref 4.0–10.5)
WBC: 17.8 10*3/uL — ABNORMAL HIGH (ref 4.0–10.5)

## 2013-05-19 LAB — GLUCOSE, CAPILLARY: Glucose-Capillary: 318 mg/dL — ABNORMAL HIGH (ref 70–99)

## 2013-05-19 SURGERY — Surgical Case
Anesthesia: Spinal

## 2013-05-19 MED ORDER — OMEPRAZOLE MAGNESIUM 20 MG PO TBEC: 20.0000 mg | DELAYED_RELEASE_TABLET | Freq: Every day | ORAL | Status: DC

## 2013-05-19 MED ORDER — ONDANSETRON HCL 4 MG/2ML IJ SOLN
INTRAMUSCULAR | Status: DC | PRN
  Administered 2013-05-19: 4 mg via INTRAVENOUS

## 2013-05-19 MED ORDER — OXYTOCIN 10 UNIT/ML IJ SOLN
INTRAMUSCULAR | Status: AC
  Filled 2013-05-19: qty 4

## 2013-05-19 MED ORDER — DEXTROSE 5 % IV SOLN
3.0000 g | INTRAVENOUS | Status: DC | PRN
  Administered 2013-05-19: 3 g via INTRAVENOUS

## 2013-05-19 MED ORDER — SODIUM CHLORIDE 0.9 % IJ SOLN: 3.0000 mL | INTRAMUSCULAR | Status: DC | PRN

## 2013-05-19 MED ORDER — MORPHINE SULFATE (PF) 0.5 MG/ML IJ SOLN
INTRAMUSCULAR | Status: DC | PRN
  Administered 2013-05-19: 400 ug via EPIDURAL
  Administered 2013-05-19: 100 ug via INTRAVENOUS

## 2013-05-19 MED ORDER — INSULIN REGULAR HUMAN 100 UNIT/ML IJ SOLN
2.0000 [IU] | Freq: Once | INTRAMUSCULAR | Status: AC
  Administered 2013-05-19: 2 [IU] via SUBCUTANEOUS

## 2013-05-19 MED ORDER — MEPERIDINE HCL 25 MG/ML IJ SOLN: 6.2500 mg | INTRAMUSCULAR | Status: DC | PRN

## 2013-05-19 MED ORDER — KETOROLAC TROMETHAMINE 30 MG/ML IJ SOLN: 30.0000 mg | Freq: Four times a day (QID) | INTRAMUSCULAR | Status: AC | PRN

## 2013-05-19 MED ORDER — SCOPOLAMINE 1 MG/3DAYS TD PT72
1.0000 | MEDICATED_PATCH | Freq: Once | TRANSDERMAL | Status: DC
  Filled 2013-05-19: qty 1

## 2013-05-19 MED ORDER — ACETAMINOPHEN 10 MG/ML IV SOLN
1000.0000 mg | Freq: Four times a day (QID) | INTRAVENOUS | Status: AC | PRN
  Filled 2013-05-19: qty 100

## 2013-05-19 MED ORDER — ONDANSETRON HCL 4 MG/2ML IJ SOLN
4.0000 mg | INTRAMUSCULAR | Status: DC | PRN
  Administered 2013-05-19 (×2): 4 mg via INTRAVENOUS

## 2013-05-19 MED ORDER — DIBUCAINE 1 % RE OINT: 1.0000 "application " | TOPICAL_OINTMENT | RECTAL | Status: DC | PRN

## 2013-05-19 MED ORDER — KETOROLAC TROMETHAMINE 30 MG/ML IJ SOLN
INTRAMUSCULAR | Status: AC
  Administered 2013-05-19: 30 mg via INTRAVENOUS
  Filled 2013-05-19: qty 1

## 2013-05-19 MED ORDER — DIPHENHYDRAMINE HCL 50 MG/ML IJ SOLN: 25.0000 mg | INTRAMUSCULAR | Status: DC | PRN

## 2013-05-19 MED ORDER — SCOPOLAMINE 1 MG/3DAYS TD PT72
MEDICATED_PATCH | TRANSDERMAL | Status: AC
  Administered 2013-05-19: 1.5 mg via TRANSDERMAL
  Filled 2013-05-19: qty 1

## 2013-05-19 MED ORDER — PHENYLEPHRINE 40 MCG/ML (10ML) SYRINGE FOR IV PUSH (FOR BLOOD PRESSURE SUPPORT)
PREFILLED_SYRINGE | INTRAVENOUS | Status: AC
  Filled 2013-05-19: qty 5

## 2013-05-19 MED ORDER — KETOROLAC TROMETHAMINE 30 MG/ML IJ SOLN
30.0000 mg | Freq: Four times a day (QID) | INTRAMUSCULAR | Status: AC | PRN
  Administered 2013-05-19: 30 mg via INTRAVENOUS
  Filled 2013-05-19: qty 1

## 2013-05-19 MED ORDER — MISOPROSTOL 200 MCG PO TABS
800.0000 ug | ORAL_TABLET | Freq: Once | ORAL | Status: AC
  Administered 2013-05-19: 800 ug via ORAL

## 2013-05-19 MED ORDER — PANTOPRAZOLE SODIUM 40 MG PO TBEC
40.0000 mg | DELAYED_RELEASE_TABLET | Freq: Every day | ORAL | Status: DC
  Administered 2013-05-19 – 2013-05-21 (×3): 40 mg via ORAL
  Filled 2013-05-19 (×4): qty 1

## 2013-05-19 MED ORDER — TETANUS-DIPHTH-ACELL PERTUSSIS 5-2.5-18.5 LF-MCG/0.5 IM SUSP
0.5000 mL | Freq: Once | INTRAMUSCULAR | Status: DC
  Filled 2013-05-19: qty 0.5

## 2013-05-19 MED ORDER — ONDANSETRON HCL 4 MG/2ML IJ SOLN
4.0000 mg | Freq: Three times a day (TID) | INTRAMUSCULAR | Status: DC | PRN
  Filled 2013-05-19 (×2): qty 2

## 2013-05-19 MED ORDER — OXYCODONE-ACETAMINOPHEN 5-325 MG PO TABS
1.0000 | ORAL_TABLET | ORAL | Status: DC | PRN
  Administered 2013-05-20 – 2013-05-21 (×5): 2 via ORAL
  Administered 2013-05-21: 1 via ORAL
  Administered 2013-05-21 – 2013-05-22 (×2): 2 via ORAL
  Filled 2013-05-19 (×5): qty 2
  Filled 2013-05-19: qty 1
  Filled 2013-05-19 (×2): qty 2

## 2013-05-19 MED ORDER — MIDAZOLAM HCL 2 MG/2ML IJ SOLN: 0.5000 mg | Freq: Once | INTRAMUSCULAR | Status: DC | PRN

## 2013-05-19 MED ORDER — PROMETHAZINE HCL 25 MG/ML IJ SOLN: 6.2500 mg | INTRAMUSCULAR | Status: DC | PRN

## 2013-05-19 MED ORDER — WITCH HAZEL-GLYCERIN EX PADS: 1.0000 "application " | MEDICATED_PAD | CUTANEOUS | Status: DC | PRN

## 2013-05-19 MED ORDER — DIPHENHYDRAMINE HCL 50 MG/ML IJ SOLN: 12.5000 mg | INTRAMUSCULAR | Status: DC | PRN

## 2013-05-19 MED ORDER — CITRIC ACID-SODIUM CITRATE 334-500 MG/5ML PO SOLN: 30.0000 mL | Freq: Once | ORAL | Status: AC

## 2013-05-19 MED ORDER — LANOLIN HYDROUS EX OINT: 1.0000 "application " | TOPICAL_OINTMENT | CUTANEOUS | Status: DC | PRN

## 2013-05-19 MED ORDER — OXYTOCIN 10 UNIT/ML IJ SOLN
40.0000 [IU] | INTRAVENOUS | Status: DC | PRN
  Administered 2013-05-19: 40 [IU] via INTRAVENOUS

## 2013-05-19 MED ORDER — ONDANSETRON HCL 4 MG PO TABS: 4.0000 mg | ORAL_TABLET | ORAL | Status: DC | PRN

## 2013-05-19 MED ORDER — SIMETHICONE 80 MG PO CHEW
80.0000 mg | CHEWABLE_TABLET | ORAL | Status: DC | PRN
  Administered 2013-05-20: 80 mg via ORAL

## 2013-05-19 MED ORDER — NALBUPHINE HCL 10 MG/ML IJ SOLN
5.0000 mg | INTRAMUSCULAR | Status: DC | PRN
  Filled 2013-05-19: qty 1

## 2013-05-19 MED ORDER — SENNOSIDES-DOCUSATE SODIUM 8.6-50 MG PO TABS
2.0000 | ORAL_TABLET | Freq: Every day | ORAL | Status: DC
  Administered 2013-05-20 – 2013-05-21 (×2): 2 via ORAL

## 2013-05-19 MED ORDER — MENTHOL 3 MG MT LOZG: 1.0000 | LOZENGE | OROMUCOSAL | Status: DC | PRN

## 2013-05-19 MED ORDER — OXYTOCIN 40 UNITS IN LACTATED RINGERS INFUSION - SIMPLE MED: 62.5000 mL/h | INTRAVENOUS | Status: AC

## 2013-05-19 MED ORDER — MEPERIDINE HCL 25 MG/ML IJ SOLN
INTRAMUSCULAR | Status: AC
  Filled 2013-05-19: qty 1

## 2013-05-19 MED ORDER — ONDANSETRON HCL 4 MG/2ML IJ SOLN
INTRAMUSCULAR | Status: AC
  Filled 2013-05-19: qty 2

## 2013-05-19 MED ORDER — ZOLPIDEM TARTRATE 5 MG PO TABS: 5.0000 mg | ORAL_TABLET | Freq: Every evening | ORAL | Status: DC | PRN

## 2013-05-19 MED ORDER — MEPERIDINE HCL 25 MG/ML IJ SOLN
INTRAMUSCULAR | Status: DC | PRN
  Administered 2013-05-19 (×2): 12.5 mg via INTRAVENOUS

## 2013-05-19 MED ORDER — METOCLOPRAMIDE HCL 5 MG/ML IJ SOLN
10.0000 mg | Freq: Three times a day (TID) | INTRAMUSCULAR | Status: DC | PRN
  Administered 2013-05-19 (×2): 10 mg via INTRAVENOUS
  Filled 2013-05-19 (×2): qty 2

## 2013-05-19 MED ORDER — LACTATED RINGERS IV SOLN
INTRAVENOUS | Status: DC | PRN
  Administered 2013-05-19 (×4): via INTRAVENOUS

## 2013-05-19 MED ORDER — EPHEDRINE 5 MG/ML INJ
INTRAVENOUS | Status: AC
  Filled 2013-05-19: qty 10

## 2013-05-19 MED ORDER — NALOXONE HCL 0.4 MG/ML IJ SOLN: 0.4000 mg | INTRAMUSCULAR | Status: DC | PRN

## 2013-05-19 MED ORDER — CITRIC ACID-SODIUM CITRATE 334-500 MG/5ML PO SOLN
ORAL | Status: AC
  Administered 2013-05-19: 30 mL via ORAL
  Filled 2013-05-19: qty 15

## 2013-05-19 MED ORDER — MISOPROSTOL 200 MCG PO TABS
ORAL_TABLET | ORAL | Status: AC
  Filled 2013-05-19: qty 5

## 2013-05-19 MED ORDER — DIPHENHYDRAMINE HCL 25 MG PO CAPS: 25.0000 mg | ORAL_CAPSULE | Freq: Four times a day (QID) | ORAL | Status: DC | PRN

## 2013-05-19 MED ORDER — IBUPROFEN 800 MG PO TABS
800.0000 mg | ORAL_TABLET | Freq: Three times a day (TID) | ORAL | Status: DC
  Administered 2013-05-20 – 2013-05-22 (×7): 800 mg via ORAL
  Filled 2013-05-19 (×7): qty 1

## 2013-05-19 MED ORDER — LACTATED RINGERS IV SOLN
INTRAVENOUS | Status: DC
  Administered 2013-05-19 – 2013-05-20 (×6): via INTRAVENOUS

## 2013-05-19 MED ORDER — PHENYLEPHRINE HCL 10 MG/ML IJ SOLN
INTRAMUSCULAR | Status: DC | PRN
  Administered 2013-05-19 (×5): 80 ug via INTRAVENOUS

## 2013-05-19 MED ORDER — NALOXONE HCL 1 MG/ML IJ SOLN
1.0000 ug/kg/h | INTRAVENOUS | Status: DC | PRN
  Filled 2013-05-19: qty 2

## 2013-05-19 MED ORDER — PRENATAL MULTIVITAMIN CH
1.0000 | ORAL_TABLET | Freq: Every day | ORAL | Status: DC
  Administered 2013-05-20 – 2013-05-21 (×2): 1 via ORAL
  Filled 2013-05-19 (×2): qty 1

## 2013-05-19 MED ORDER — FENTANYL CITRATE 0.05 MG/ML IJ SOLN: 25.0000 ug | INTRAMUSCULAR | Status: DC | PRN

## 2013-05-19 MED ORDER — FENTANYL CITRATE 0.05 MG/ML IJ SOLN
INTRAMUSCULAR | Status: AC
  Filled 2013-05-19: qty 2

## 2013-05-19 MED ORDER — SODIUM BICARBONATE 8.4 % IV SOLN
INTRAVENOUS | Status: DC | PRN
  Administered 2013-05-19: 5 mL via EPIDURAL

## 2013-05-19 MED ORDER — LACTATED RINGERS IV BOLUS (SEPSIS)
500.0000 mL | Freq: Once | INTRAVENOUS | Status: AC
  Administered 2013-05-19: 500 mL via INTRAVENOUS

## 2013-05-19 MED ORDER — MORPHINE SULFATE 0.5 MG/ML IJ SOLN
INTRAMUSCULAR | Status: AC
  Filled 2013-05-19: qty 10

## 2013-05-19 MED ORDER — DIPHENHYDRAMINE HCL 25 MG PO CAPS: 25.0000 mg | ORAL_CAPSULE | ORAL | Status: DC | PRN

## 2013-05-19 MED ORDER — SIMETHICONE 80 MG PO CHEW
80.0000 mg | CHEWABLE_TABLET | Freq: Three times a day (TID) | ORAL | Status: DC
  Administered 2013-05-20 – 2013-05-21 (×6): 80 mg via ORAL

## 2013-05-19 SURGICAL SUPPLY — 30 items
CLOTH BEACON ORANGE TIMEOUT ST (SAFETY) ×2 IMPLANT
CONTAINER PREFILL 10% NBF 15ML (MISCELLANEOUS) IMPLANT
DRAPE LG THREE QUARTER DISP (DRAPES) ×2 IMPLANT
DRSG OPSITE POSTOP 4X10 (GAUZE/BANDAGES/DRESSINGS) ×2 IMPLANT
DURAPREP 26ML APPLICATOR (WOUND CARE) ×2 IMPLANT
ELECT REM PT RETURN 9FT ADLT (ELECTROSURGICAL) ×2
ELECTRODE REM PT RTRN 9FT ADLT (ELECTROSURGICAL) ×1 IMPLANT
EXTRACTOR VACUUM M CUP 4 TUBE (SUCTIONS) IMPLANT
GLOVE BIO SURGEON STRL SZ 6.5 (GLOVE) ×2 IMPLANT
GOWN STRL REIN XL XLG (GOWN DISPOSABLE) ×4 IMPLANT
KIT ABG SYR 3ML LUER SLIP (SYRINGE) IMPLANT
NEEDLE HYPO 25X5/8 SAFETYGLIDE (NEEDLE) IMPLANT
NS IRRIG 1000ML POUR BTL (IV SOLUTION) ×2 IMPLANT
PACK C SECTION WH (CUSTOM PROCEDURE TRAY) ×2 IMPLANT
PAD OB MATERNITY 4.3X12.25 (PERSONAL CARE ITEMS) ×2 IMPLANT
RTRCTR C-SECT PINK 25CM LRG (MISCELLANEOUS) ×2 IMPLANT
STAPLER VISISTAT 35W (STAPLE) IMPLANT
SUT MNCRL 0 VIOLET CTX 36 (SUTURE) ×2 IMPLANT
SUT MONOCRYL 0 CTX 36 (SUTURE) ×2
SUT PLAIN 1 NONE 54 (SUTURE) IMPLANT
SUT PLAIN 2 0 XLH (SUTURE) ×2 IMPLANT
SUT VIC AB 0 CT1 27 (SUTURE) ×2
SUT VIC AB 0 CT1 27XBRD ANBCTR (SUTURE) ×2 IMPLANT
SUT VIC AB 2-0 CT1 27 (SUTURE) ×1
SUT VIC AB 2-0 CT1 TAPERPNT 27 (SUTURE) ×1 IMPLANT
SUT VIC AB 4-0 KS 27 (SUTURE) IMPLANT
SYR BULB IRRIGATION 50ML (SYRINGE) ×2 IMPLANT
TOWEL OR 17X24 6PK STRL BLUE (TOWEL DISPOSABLE) ×6 IMPLANT
TRAY FOLEY CATH 14FR (SET/KITS/TRAYS/PACK) ×2 IMPLANT
WATER STERILE IRR 1000ML POUR (IV SOLUTION) ×2 IMPLANT

## 2013-05-19 NOTE — Anesthesia Preprocedure Evaluation (Signed)
Anesthesia Evaluation  Patient identified by MRN, date of birth, ID band Patient awake    Reviewed: Allergy & Precautions, H&P , Patient's Chart, lab work & pertinent test results  Airway Mallampati: IV TM Distance: >3 FB Neck ROM: full    Dental no notable dental hx.    Pulmonary neg pulmonary ROS,  breath sounds clear to auscultation  Pulmonary exam normal       Cardiovascular hypertension, negative cardio ROS  Rhythm:regular Rate:Normal     Neuro/Psych negative neurological ROS  negative psych ROS   GI/Hepatic negative GI ROS, Neg liver ROS,   Endo/Other  negative endocrine ROSdiabetesHyperthyroidism   Renal/GU negative Renal ROS     Musculoskeletal   Abdominal   Peds  Hematology negative hematology ROS (+)   Anesthesia Other Findings History of uterine atony  Reproductive/Obstetrics (+) Pregnancy                           Anesthesia Physical Anesthesia Plan  ASA: III and emergent  Anesthesia Plan: Epidural   Post-op Pain Management:    Induction:   Airway Management Planned:   Additional Equipment:   Intra-op Plan:   Post-operative Plan:   Informed Consent: I have reviewed the patients History and Physical, chart, labs and discussed the procedure including the risks, benefits and alternatives for the proposed anesthesia with the patient or authorized representative who has indicated his/her understanding and acceptance.     Plan Discussed with:   Anesthesia Plan Comments:         Anesthesia Quick Evaluation

## 2013-05-19 NOTE — Transfer of Care (Signed)
Immediate Anesthesia Transfer of Care Note  Patient: Audrey [name]  Procedure(s) Performed: Procedure(s): CESAREAN SECTION (N/A)  Patient Location: PACU  Anesthesia Type:Epidural  Level of Consciousness: awake, alert  and oriented  Airway & Oxygen Therapy: Patient Spontanous Breathing  Post-op Assessment: Report given to PACU RN, Post -op Vital signs reviewed and stable and Patient moving all extremities  Post vital signs: Reviewed and stable  Complications: No apparent anesthesia complications

## 2013-05-19 NOTE — Progress Notes (Signed)
I spent time with this family throughout the day.  When I saw them in PACU, [name] had some initial questions about funeral arrangements which I helped answer.  They were grateful that I offered to spend some time with their 27 year old, Audrey [name].  We sat outside the room to give [name] some time with her baby.  Through coloring and writing, I offered grief support to [facility].  The family felt cared for by my act of caring for their child.  I also provided emotional support to various family members throughout the day.    I will follow up with family tomorrow, but please also page as needs may arise today.  [employer] Pager, [phone] 2:58 PM   05/19/13 1400  Clinical Encounter Type  Visited With Patient and family together  Visit Type Spiritual support  Referral From Nurse  Spiritual Encounters  Spiritual Needs Emotional;Grief support  Stress Factors  Patient Stress Factors Loss  Family Stress Factors Loss

## 2013-05-19 NOTE — Brief Op Note (Signed)
05/19/2013  8:20 AM  PATIENT:  Audrey [name]  27 y.o. female  PRE-OPERATIVE DIAGNOSIS:  Intrauterine fetal demise, gestational diabetic, history of uterine atony with previous pregnancy, h/o LTCS, declines TOLAC  POST-OPERATIVE DIAGNOSIS:  Intrauterine fetal demise, gestational diabetic, history of uterine atony with previous pregnancy,   PROCEDURE:  Procedure(s): CESAREAN SECTION (N/A)  SURGEON:  Surgeon(s) and Role:    * Sherron Monday, MD - Primary  ASSISTANTS: Christin Bach MD   Findings: nonviable female at 6:49am, apgars 0/0/0, wt 8#9, uteroantperotoneum adhesions.  Ovaries nl to palpation, difficult to visualize secondary to adhesions  ANESTHESIA:   epidural  EBL:  Total I/O In: 1300 [I.V.:1300] Out: 600 [Blood:600]  BLOOD ADMINISTERED:none  DRAINS: Urinary Catheter (Foley)   LOCAL MEDICATIONS USED:  NONE  SPECIMEN:  Source of Specimen:  Placenta  DISPOSITION OF SPECIMEN:  PATHOLOGY  COUNTS:  YES  TOURNIQUET:  * No tourniquets in log *  DICTATION: .Other Dictation: Dictation Number [id number]  PLAN OF CARE: Admit to inpatient   PATIENT DISPOSITION:  PACU - hemodynamically stable.   Delay start of Pharmacological VTE agent (>24hrs) due to surgical blood loss or risk of bleeding: yes

## 2013-05-19 NOTE — Progress Notes (Signed)
Patient ID: Audrey [name], female   DOB: 1986-03-31, 27 y.o.   MRN: [id number] Pt having N/V throughout the day and UOP diminished. VSS BS improved to 103 UOP concentrated (?bloodtinged)  Abdomen soft.  Pt denies pain.  Will give fluid bolus and watch UOP, recheck labs if does not help nausea

## 2013-05-19 NOTE — H&P (Signed)
KIMYATA MILICH is a 27 y.o. female .G2P1001 at 38+ who presents with ROM, meconium stained at 1am.  On arrival heart tones unable to be obtained.  [place] confirms IUFD.  Prenatal care largely uncomplicated, followed for growth with [place] also gestational diabetes, per pt sugars well-controlled with diet.  FSBS in MAU 318.  Also h/o atony will get type and crossmatch.  D/w pt mode for delivery - desires rLTCS, had wanted BTL - now declines.  D/w pt r/b/a - also mentioned risk of hysterectomy.  Maternal Medical History:  Reason for admission: Rupture of membranes.   Contractions: Frequency: irregular.    Prenatal Complications - Diabetes: gestational.    OB History   Grav Para Term Preterm Abortions TAB SAB Ect Mult Living   2 1 1  0 0 0 0 0 0 1    G1 LTCS fetal stress - 7#5, PIH G2 present No abn pap  Past Medical History  Diagnosis Date  . Preeclampsia   . Diabetes mellitus without complication   . Pregnancy induced hypertension   . Gestational diabetes    Past Surgical History  Procedure Laterality Date  . Cesarean section    . Toenail excision    . Wisdom tooth extraction     Family History: DM, DVT,PE, HTN Social History:  reports that she has never smoked. She has never used smokeless tobacco. She reports that she does not drink alcohol or use illicit drugs.  Meds PNV, Fiorcet All NKDA   Prenatal Transfer Tool  Maternal Diabetes: Yes:  Diabetes Type:  Diet controlled Genetic Screening: Normal Maternal Ultrasounds/Referrals: Normal Fetal Ultrasounds or other Referrals:  Referred to Materal Fetal Medicine  Maternal Substance Abuse:  No Significant Maternal Medications:  None Significant Maternal Lab Results:  Lab values include: Group B Strep positive Other Comments:  IUFD  Review of Systems  Constitutional: Negative.   HENT: Negative.   Eyes: Negative.   Respiratory: Negative.   Cardiovascular: Negative.   Gastrointestinal: Negative.   Genitourinary: Negative.    Musculoskeletal: Negative.   Skin: Negative.   Neurological: Negative.   Psychiatric/Behavioral: Negative.     Dilation: Fingertip Exam by:: B Bethea RN Blood pressure 111/62, pulse 84, temperature 97.8 F (36.6 C), temperature source Oral, resp. rate 18, height 5\' 7"  (1.702 m), weight 143.7 kg (316 lb 12.8 oz), last menstrual period 08/23/2012, SpO2 99.00%. Maternal Exam:  Abdomen: Surgical scars: low vertical midline.   Fundal height is diff to assess due to habitus followed with growth [place].   Estimated fetal weight is 7.   Fetal presentation: vertex  Introitus: Normal vulva. Normal vagina.  Amniotic fluid character: meconium stained.  Cervix: Cervix evaluated by digital exam.     Physical Exam  Constitutional: She is oriented to person, place, and time. She appears well-developed and well-nourished.  HENT:  Head: Normocephalic and atraumatic.  Cardiovascular: Normal rate and regular rhythm.   Respiratory: Effort normal and breath sounds normal. No respiratory distress.  GI: Soft. Bowel sounds are normal. There is no tenderness.  Musculoskeletal: Normal range of motion.  Neurological: She is alert and oriented to person, place, and time.  Skin: Skin is warm and dry.  Psychiatric: She has a normal mood and affect. Her behavior is normal.    Prenatal labs: ABO, Rh:  AB+ Antibody:  neg Rubella:  immune RPR:   nr HBsAg:   neg HIV:   neg GBS:   positive Hgb 11.9/ Pap WNL/ UrCx +, Plt 395K/ Hgb electro WNL/  GC neg/ Chl neg/ First Tri and AFP WNL/ glucola 161/ 3hr GDM/ gbbs+  [place] dated by 8 wk Nl anat, ant plac, female Growth followed by [place]  Flu 11/4 Tdap 2/27  Assessment/Plan: 27yo G2P1001 at 38+ with IUFD/SROM for rLTCS, d/w pt r/b/a will proceed.   BOVARD,Roddrick Sharron 05/19/2013, 4:54 AM

## 2013-05-19 NOTE — MAU Note (Signed)
Pt states she got up to use the bathroom and went to lay back down and when she laid down she felt a gush of fluid. Denies vaginal bleeding.

## 2013-05-19 NOTE — MAU Note (Addendum)
Pt came in around 0245. Tried to auscultate FHT with cardio and doppler and could not find FHT. Called for assistance and H Hogan CNM and Georgiana Shore RN came to assist. Stat bedside US ordered and Dr Ellyn Hack called. IV started. [place] determined no FHT.

## 2013-05-19 NOTE — Anesthesia Postprocedure Evaluation (Signed)
  Anesthesia Post-op Note  Patient: Audrey [name]  Procedure(s) Performed: Procedure(s): CESAREAN SECTION (N/A)  Patient is awake, responsive, moving her legs, and has signs of resolution of her numbness. Pain and nausea are reasonably well controlled. Vital signs are stable and clinically acceptable. Oxygen saturation is clinically acceptable. There are no apparent anesthetic complications at this time. Patient is ready for discharge.

## 2013-05-19 NOTE — Anesthesia Procedure Notes (Signed)
Epidural Patient location during procedure: OR Start time: 05/19/2013 6:28 AM  Staffing Anesthesiologist: Angus Seller., Harrell Gave. Performed by: anesthesiologist   Preanesthetic Checklist Completed: patient identified, site marked, surgical consent, pre-op evaluation, timeout performed, IV checked, risks and benefits discussed and monitors and equipment checked  Epidural Patient position: sitting Prep: site prepped and draped and DuraPrep Patient monitoring: continuous pulse ox and blood pressure Approach: midline Injection technique: LOR air and LOR saline  Needle:  Needle type: Tuohy  Needle gauge: 17 G Needle length: 15 cm Needle insertion depth: 11 cm Catheter type: closed end flexible Catheter size: 19 Gauge Catheter at skin depth: 15 cm Test dose: negative  Assessment Events: blood not aspirated, injection not painful, no injection resistance, negative IV test and no paresthesia  Additional Notes Patient identified.  Risk benefits discussed including failed block, incomplete pain control, headache, nerve damage, paralysis, blood pressure changes, nausea, vomiting, reactions to medication both toxic or allergic, and postpartum back pain.  Patient expressed understanding and wished to proceed.  All questions were answered.  Sterile technique used throughout procedure and epidural site dressed with sterile barrier dressing. No paresthesia or other complications noted.The patient did not experience any signs of intravascular injection such as tinnitus or metallic taste in mouth nor signs of intrathecal spread such as rapid motor block. Please see nursing notes for vital signs.

## 2013-05-20 ENCOUNTER — Other Ambulatory Visit (HOSPITAL_COMMUNITY): Payer: Medicaid Other

## 2013-05-20 ENCOUNTER — Encounter (HOSPITAL_COMMUNITY): Payer: Self-pay | Admitting: Obstetrics and Gynecology

## 2013-05-20 ENCOUNTER — Inpatient Hospital Stay (HOSPITAL_COMMUNITY): Admission: RE | Admit: 2013-05-20 | Payer: Medicaid Other | Source: Ambulatory Visit

## 2013-05-20 DIAGNOSIS — Z98891 History of uterine scar from previous surgery: Secondary | ICD-10-CM

## 2013-05-20 HISTORY — DX: History of uterine scar from previous surgery: Z98.891

## 2013-05-20 LAB — GLUCOSE, CAPILLARY
Glucose-Capillary: 104 mg/dL — ABNORMAL HIGH (ref 70–99)
Glucose-Capillary: 120 mg/dL — ABNORMAL HIGH (ref 70–99)
Glucose-Capillary: 124 mg/dL — ABNORMAL HIGH (ref 70–99)
Glucose-Capillary: 138 mg/dL — ABNORMAL HIGH (ref 70–99)

## 2013-05-20 LAB — CBC
Hemoglobin: 9 g/dL — ABNORMAL LOW (ref 12.0–15.0)
MCV: 87.5 fL (ref 78.0–100.0)
Platelets: 266 10*3/uL (ref 150–400)
RBC: 3.28 MIL/uL — ABNORMAL LOW (ref 3.87–5.11)
WBC: 12.9 10*3/uL — ABNORMAL HIGH (ref 4.0–10.5)

## 2013-05-20 NOTE — Progress Notes (Signed)
Subjective: Postpartum Day 1: Cesarean Delivery Patient reports incisional pain and tolerating PO. Nl lochia, pain controlled   Objective: Vital signs in last 24 hours: Temp:  [97.5 F (36.4 C)-99.3 F (37.4 C)] 97.6 F (36.4 C) (05/23 0603) Pulse Rate:  [73-90] 80 (05/23 0603) Resp:  [16-23] 20 (05/23 0603) BP: (115-142)/(57-85) 115/60 mmHg (05/23 0603) SpO2:  [92 %-100 %] 100 % (05/23 0603) FSBS low 100's, one 238, 318  Physical Exam:  General: alert and no distress Lochia: appropriate Uterine Fundus: firm Incision: healing well DVT Evaluation: No evidence of DVT seen on physical exam.   Recent Labs  05/19/13 0845 05/20/13 0540  HGB 10.1* 9.0*  HCT 32.3* 28.7*    Assessment/Plan: Status post Cesarean section. Doing well postoperatively.  Continue current care.  Chaplain involved - appreciate help Watch uop - continue foley for now.    Audrey [name],Audrey [name] 05/20/2013, 8:37 AM

## 2013-05-20 NOTE — Op Note (Signed)
NAME:  EULALA, NEWCOMBE NO.:  [id number]  MEDICAL RECORD NO.:  [id number]  LOCATION:  9302                          FACILITY:  [facility]  PHYSICIAN:  Sherron Monday, MD        DATE OF BIRTH:  11-26-1986  DATE OF PROCEDURE:  05/19/2013 DATE OF DISCHARGE:                              OPERATIVE REPORT   PREOPERATIVE DIAGNOSIS:  Intrauterine fetal demise, gestational diabetic, history of uterine atony with previous pregnancy, history of low-transverse cesarean section, declined vaginal birth after cesarean, declined trial of labor.  POSTOPERATIVE DIAGNOSIS:  Intrauterine fetal demise, gestational diabetic, history of uterine atony with previous pregnancy, history of low-transverse cesarean section, declined vaginal birth after cesarean, declined trial of labor.  PROCEDURE:  Low-transverse cesarean section.  SURGEON:  Sherron Monday, MD.  ASSISTANT:  Tilda Burrow, M.D.  ANESTHESIA:  Epidural.  Foley catheter was placed.  FINDINGS:  Nonviable female infant delivered at 6:49 a.m. with Apgars of 0, 0, and 0; and weight of 8 pounds 9 ounces.  Placenta was sent to pathology.  COMPLICATIONS:  Uteroperitoneal adhesions.  PROCEDURE IN DETAIL:  After informed consent was reviewed with the patient including risks, benefits, and alternatives of surgical procedure, she was transported to the OR where epidural anesthesia was placed and found to be adequate.  The patient was counseled regarding bleeding, infection, damage to the surrounding organs, as well as with a history of uterine atony, and possible need for a cesarean hysterotomy. The patient voiced understanding and wished to proceed.  She was transported to the OR.  Epidural anesthesia was placed and found to be adequate.  She was then prepped and draped in the normal sterile fashion.  Foley catheter was sterilely placed.  A Pfannenstiel skin incision was made at the level of 2 fingerbreadths above the pubic symphysis  carried through the fascia sharply.  The fascia was incised in the midline.  The incision was extended laterally with Mayo scissors, fascial incisions with Kocher clamps, the inferior aspect, and the rectus muscles were dissected off both bluntly and sharply.  Attention was turned to the superior portion, which in a similar fashion grasped with Kocher clamp, elevated the rectus muscles and dissected off both bluntly and sharply.  Attention was turned to identifying the midline. It was easily identified and the peritoneum was entered in the process of advancing the fascial exposure.  The incision was extended superiorly and inferiorly with good visualization of the bladder.  A uterine inspection revealed the fine adhesions __________.  The Alexis skin retractor was carefully placed making sure no bowel was entrapped.  The vesicouterine peritoneum was tented up with smooth pickups, and the bladder flap was created both digitally and sharply.  The uterus was incised in a transverse fashion and infant was delivered from a vertex presentation.  IFD was confirmed at delivery, handed off to the awaiting pediatric staff.  The placenta was expressed from the uterus.  The uterus was cleared of all clot and debris.  The patient received Cytotec due to her history of atony and slow contraction of the uterus.  Uterine incision was closed 2 layers with 0 Monocryl, the first of which  was running locked and the second as an imbricating layer.  A 2-0 Vicryl was used to the uterine fundus to repair a rent in the serosa from reducing adhesions.  Copious pelvic irrigation was performed.  The peritoneum was grasped with Kelly clamp.  The peritoneum was reapproximated using 2-0 Vicryl, as well as including some rectus muscle in a bulk closure.  The subfascial planes were inspected, found to be hemostatic.  The fascia was closed with 0 Vicryl from either corner overlapping in the midline. The subcuticular area  was irrigated and made hemostatic with Bovie cautery.  The dead space was then closed with 3-0 plain gut on a large needle.  The skin was closed with 4-0 Vicryl in a subcuticular fashion. Benzoin and Steri-Strips were applied.  Sponge, lap, and needle count was correct x2 per the operating staff.     Sherron Monday, MD     JB/MEDQ  D:  05/19/2013  T:  05/19/2013  Job:  [id number]

## 2013-05-20 NOTE — Progress Notes (Signed)
I offered grief support and grief education to [place] and offered support to her family and, especially to her daughter.  Family was appreciative of the care they have received.  [employer] Pager, [phone] 4:14 PM   05/20/13 1600  Clinical Encounter Type  Visited With Patient and family together  Visit Type Spiritual support

## 2013-05-21 LAB — GLUCOSE, CAPILLARY
Glucose-Capillary: 101 mg/dL — ABNORMAL HIGH (ref 70–99)
Glucose-Capillary: 144 mg/dL — ABNORMAL HIGH (ref 70–99)
Glucose-Capillary: 109 mg/dL — ABNORMAL HIGH (ref 70–99)

## 2013-05-21 MED ORDER — GUAIFENESIN 100 MG/5ML PO SOLN
200.0000 mg | ORAL | Status: DC | PRN
Start: 2013-05-21 — End: 2013-05-22
  Administered 2013-05-21 – 2013-05-22 (×2): 200 mg via ORAL
  Filled 2013-05-21 (×2): qty 15

## 2013-05-21 NOTE — Progress Notes (Signed)
Patient ID: Audrey [name], female   DOB: February 26, 1986, 27 y.o.   MRN: [id number] #[id number] afebrile BP normal Tolerating a diet and passing flatus. She has seen the chaplain and declines offer to see anyone else

## 2013-05-21 NOTE — Progress Notes (Signed)
Patient complaint of sore throat and right ear ache.  Vital signs checked and documented, afebrile.  Lungs clear.  Encouraged good fluid intake and rest.  Analgesia  And hot tea given.  Will continue to observe.

## 2013-05-22 MED ORDER — OXYCODONE-ACETAMINOPHEN 5-325 MG PO TABS: 1.0000 | ORAL_TABLET | Freq: Four times a day (QID) | ORAL | Status: DC | PRN

## 2013-05-22 MED ORDER — IBUPROFEN 800 MG PO TABS: 800.0000 mg | ORAL_TABLET | Freq: Three times a day (TID) | ORAL | Status: DC | PRN

## 2013-05-22 NOTE — Progress Notes (Signed)
Patient ID: Audrey [name], female   DOB: [birth year], 27 y.o.   MRN: [id number] #[id number] Continues to do well for d/c She is grieving appropriately. Will get list of psychological providers.

## 2013-05-22 NOTE — Clinical Social Work Note (Signed)
Clinical Social Work Department BRIEF PSYCHOSOCIAL ASSESSMENT 05/22/2013  Patient:  Audrey, [name]     Account Number:  [id number]     Admit date:  05/19/2013  Clinical Social Worker:  Doreene Nest  Date/Time:  05/22/2013 09:00 AM  Referred by:  RN  Date Referred:  05/21/2013 Referred for  Other - See comment   Other Referral:   Interview type:  Patient Other interview type:   fetal demise    PSYCHOSOCIAL DATA Living Status:  SIGNIFICANT OTHER Admitted from facility:   Level of care:   Primary support name:  Jomarie Longs Primary support relationship to patient:  PARTNER Degree of support available:   MOB and FOB report good support    CURRENT CONCERNS Current Concerns  Other - See comment   Other Concerns:   fetal demise    SOCIAL WORK ASSESSMENT / PLAN CSW spoke with MOB and FOB about IUFD.  MOB reports appropriate response to death.  MOB was insterested in counseling resources and CSW provided her with agencies in [employer].  CSW discussed funeral services.  MOB and FOB reported choosing [facility] and does not need any other services.  CSW offered more support if needed and to let RN know.   Assessment/plan status:  No Further Intervention Required Other assessment/ plan:   Information/referral to community resources:   Counseling and funeral services    PATIENT'S/FAMILY'S RESPONSE TO PLAN OF CARE: MOB had appropriat emotion response to IUFD and was accepting of counseling services.  MOB was appreciative of CSW assistance.

## 2013-05-22 NOTE — Progress Notes (Signed)
Pt d/c home via wc to private car with family. D/C instructions and prescriptions reviewed with pt. Pt verbalized understanding. Instructed to call MD on Tuesday for f/u appt. Member agreed.

## 2013-05-22 NOTE — Discharge Summary (Signed)
NAMELAVONNA, Audrey [name]                ACCOUNT NO.:  [id number]  MEDICAL RECORD NO.:  [id number]  LOCATION:  9302                          FACILITY:  [facility]  PHYSICIAN:  Malachi Pro. Ambrose Mantle, M.D. DATE OF BIRTH:  1986/01/15  DATE OF ADMISSION:  05/19/2013 DATE OF DISCHARGE:  05/21/2013                              DISCHARGE SUMMARY   This is a 27 year old black female para 1-0-0-1 gravida 2 at 38+ weeks gestation who presented with ruptured membranes, meconium staining.  On arrival to the hospital, fetal heart tones were not able to be obtained. Ultrasound confirmed an IUFD.  Prenatal care largely uncomplicated, followup for growth with ultrasound.  She also was seen for gestational diabetes and her sugars were well controlled with diet.  The blood sugar in the maternity admission unit about 6 hours after she had last eaten was 318.  After admission, the patient was taken to the operating room by Dr. Ellyn Hack and underwent a low-transverse cervical C-section with delivery of an 8-pound, 9-ounce stillborn infant.  The skin was closed in a subcuticular fashion with 4-0 Vicryl and Steri-Strips were applied. Postoperative, the patient did extremely well.  She was seen by the Chaplin and Chaplin felt that the patient receive benefit with her visits.  LABORATORY DATA:  Initial hemoglobin of 10.1, hematocrit 32.3, white count 17,800, platelet count 285,000.  Followup hemoglobin 9.0. Hemoglobin A1c was 7.9.  Capillary blood glucoses were 101, 144, 109, 118, 106.  FINAL DIAGNOSES:  Intrauterine pregnancy at 38+ weeks with intrauterine fetal demise, gestational diabetes apparently well controlled, but blood sugar on admission of over 300 and hemoglobin A1c of 7.9 which seem to contraindicate her failures on her labs.  OPERATION:  Low-transverse cervical C-section.  FINAL CONDITION:  Improved.  Instructions include our regular discharge instruction booklet. Actually I am not going to give her  discharge instruction booklet.  I will give her the after visit summary and ask her to return to the office in 2 weeks for followup examination.  Motrin 600 mg 30 tablets 1 every 6 hours as needed for pain, Percocet 5/325, 30 tablets, 1 every 6 hours as needed for pain given at discharge.  She has also asked to continue her prenatal vitamins and ferrous sulfate 325 mg twice daily.     Malachi Pro. Ambrose Mantle, M.D.     TFH/MEDQ  D:  05/22/2013  T:  05/22/2013  Job:  [id number]

## 2013-05-23 LAB — TYPE AND SCREEN: Antibody Screen: NEGATIVE

## 2013-05-24 ENCOUNTER — Encounter (HOSPITAL_COMMUNITY)
Admission: AD | Disposition: A | Discharge status: Home / Self Care | Payer: Self-pay | Source: Ambulatory Visit | Attending: Obstetrics and Gynecology

## 2013-05-24 ENCOUNTER — Encounter (HOSPITAL_COMMUNITY): Admission: RE | Payer: Self-pay | Source: Ambulatory Visit

## 2013-05-24 ENCOUNTER — Inpatient Hospital Stay (HOSPITAL_COMMUNITY)
Admission: RE | Admit: 2013-05-24 | Payer: Medicaid Other | Source: Ambulatory Visit | Admitting: Obstetrics and Gynecology

## 2013-05-24 SURGERY — Surgical Case
Anesthesia: Choice

## 2013-05-24 SURGERY — Surgical Case
Anesthesia: Regional

## 2013-10-11 IMAGING — US US OB DETAIL+14 WK
1 series · 12 of 28 positions shown · non-contrast
Comparison: none

[Series 1: us ob detail+14 wk · 0.27mm/px · 12 of 69 slices shown]
[im 3/69]
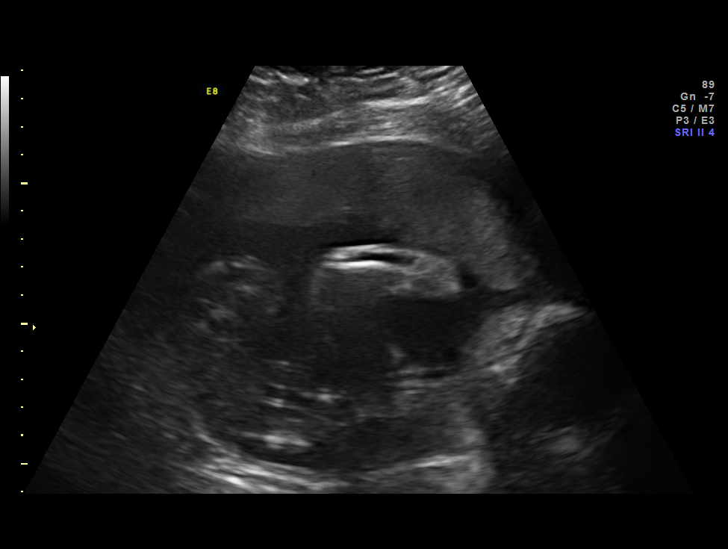
[im 8/69]
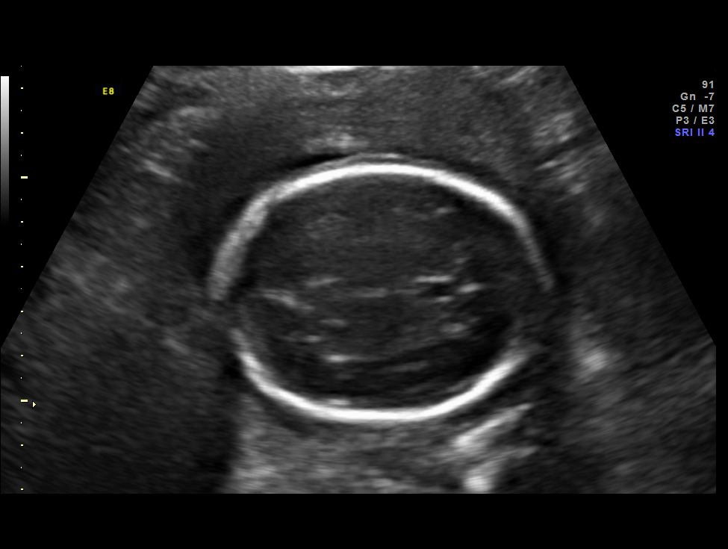
[im 13/69]
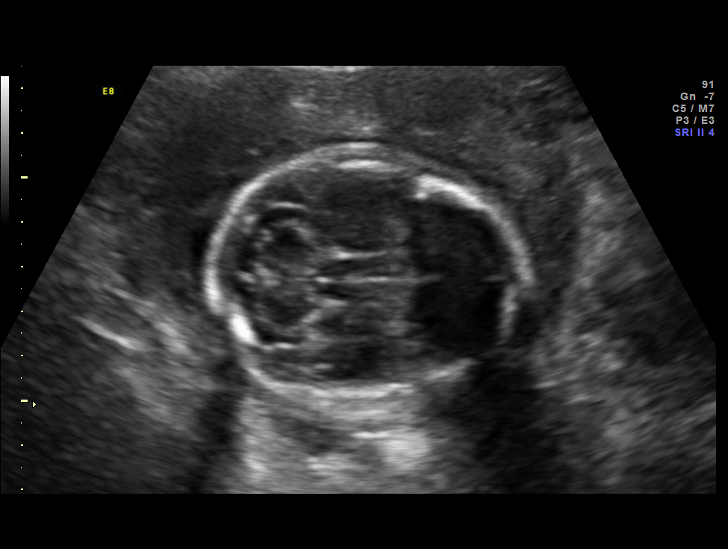
[im 21/69]
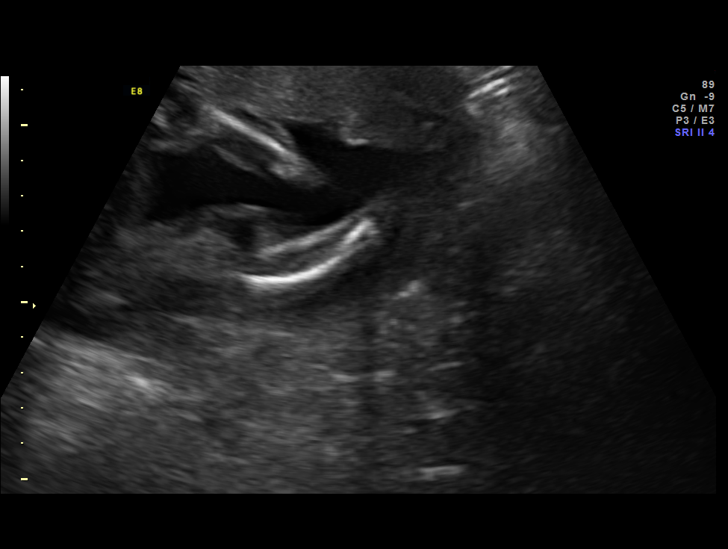
[im 26/69]
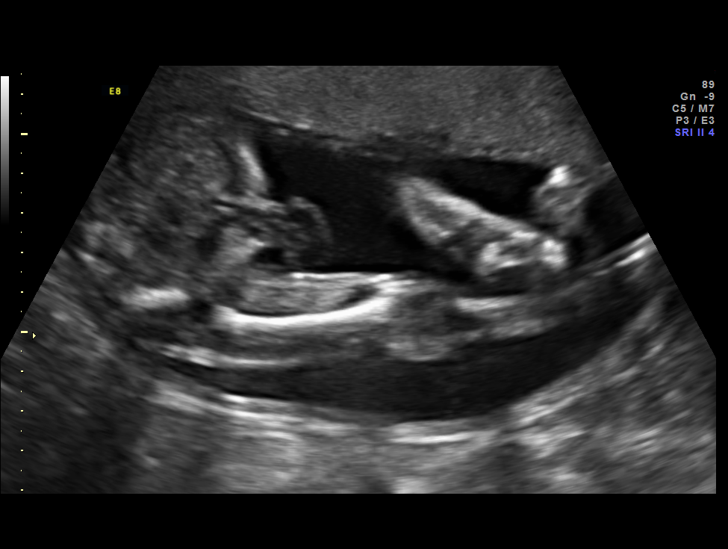
[im 31/69]
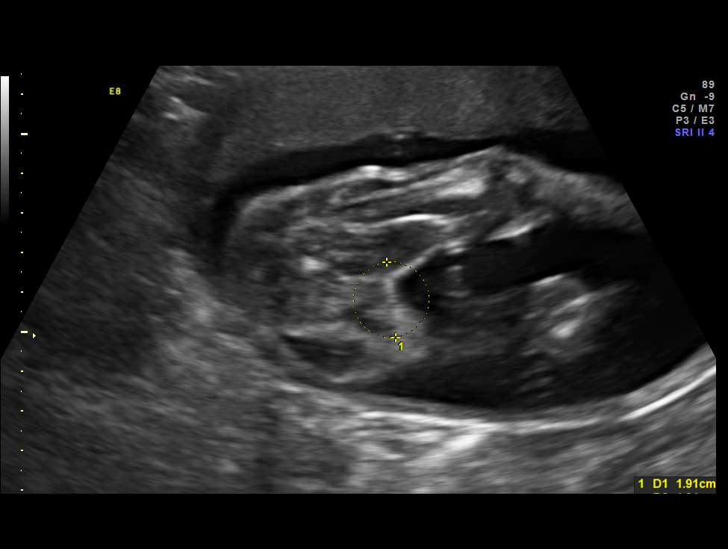
[im 38/69]
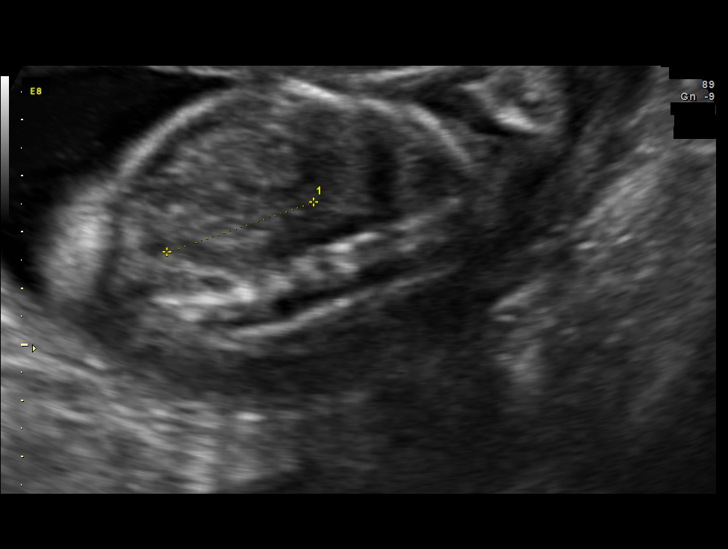
[im 43/69]
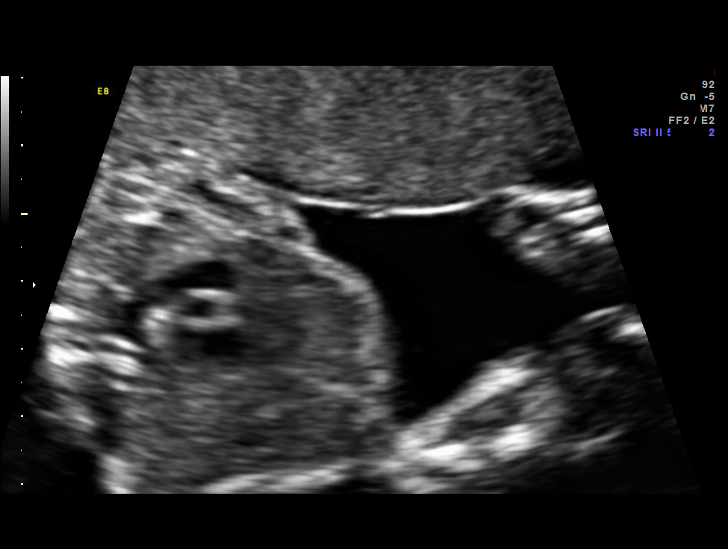
[im 48/69]
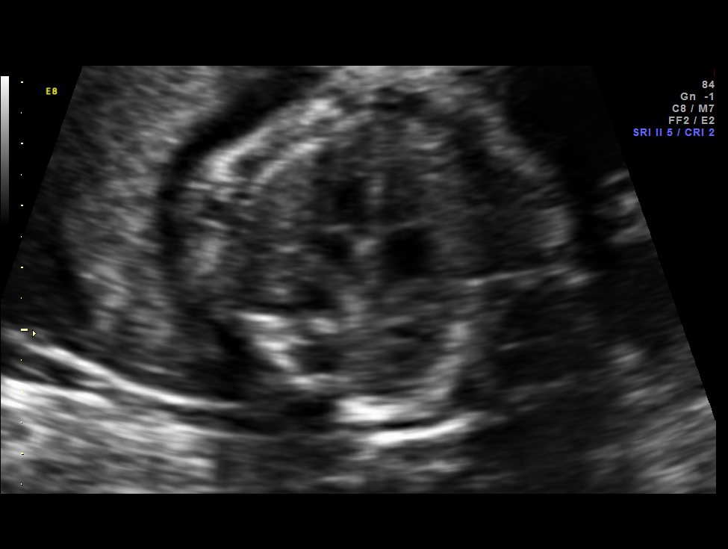
[im 56/69]
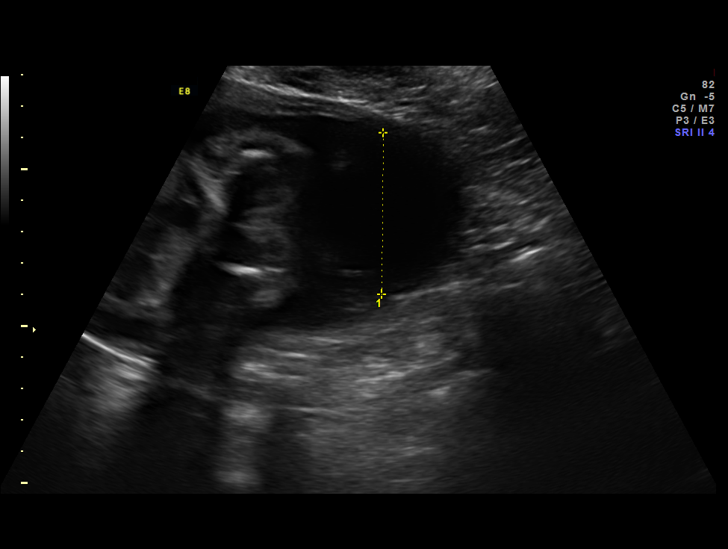
[im 61/69]
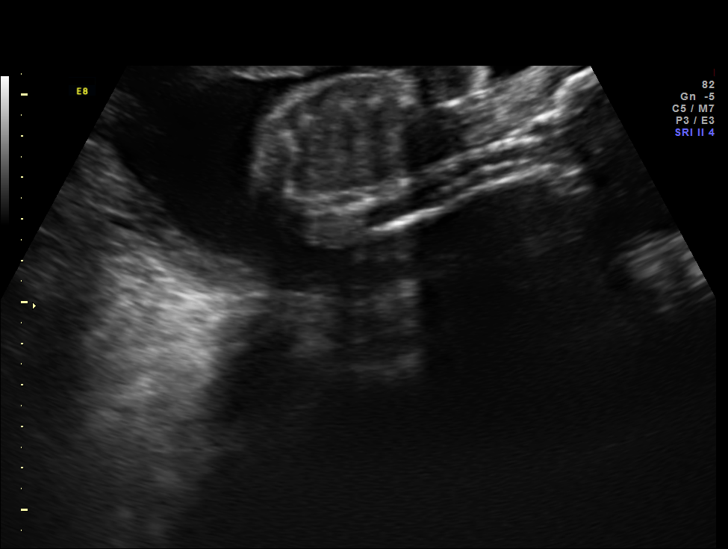
[im 66/69]
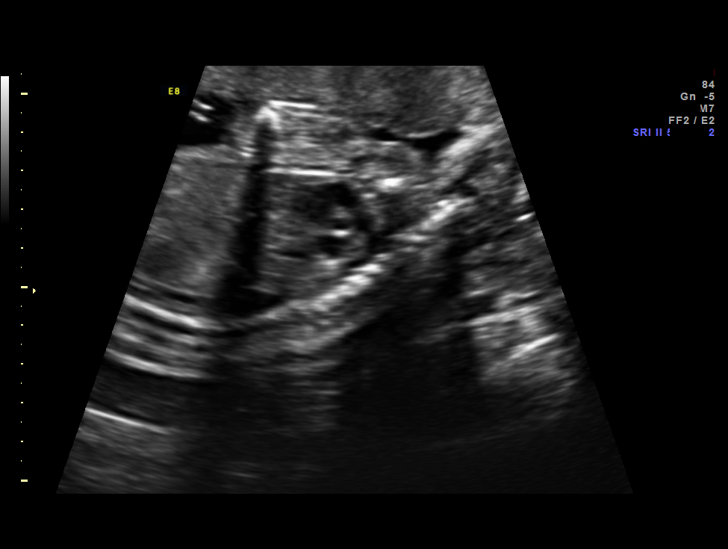

[12 of 28 positions shown; findings below may reference images not displayed]

OBSTETRICS REPORT
                      (Signed Final 02/14/2013 [DATE])

Service(s) Provided

 US OB DETAIL + 14 WK                                  76811.0
Indications

 Detailed fetal anatomic survey
 Previous cesarean section
 Poor obstetric history: Previous preeclampsia /
 eclampsia/gestational HTN
 Size less than dates (Small for gestational [AGE]
 FGR)
Fetal Evaluation

 Num Of Fetuses:    1
 Fetal Heart Rate:  169                          bpm
 Cardiac Activity:  Observed
 Presentation:      Cephalic
 Placenta:          Anterior, above cervical os
 P. Cord            Visualized
 Insertion:

 Amniotic Fluid
 AFI FV:      Subjectively within normal limits
                                             Larg Pckt:     5.2  cm
Biometry

 BPD:     56.4  mm     G. Age:  23w 2d                CI:         73.7   70 - 86
 OFD:     76.5  mm                                    FL/HC:      20.5   18.7 -

 HC:       217  mm     G. Age:  23w 5d        3  %    HC/AC:      1.08   1.04 -

 AC:       201  mm     G. Age:  24w 5d       33  %    FL/BPD:     78.7   71 - 87
 FL:      44.4  mm     G. Age:  24w 5d       25  %    FL/AC:      22.1   20 - 24
 HUM:     41.7  mm     G. Age:  25w 1d       47  %

 Est. FW:     705  gm      1 lb 9 oz     42  %
Gestational Age

 LMP:           25w 0d        Date:  08/23/12                 EDD:   05/30/13
 U/S Today:     24w 1d                                        EDD:   06/05/13
 Best:          25w 0d     Det. By:  LMP  (08/23/12)          EDD:   05/30/13
Anatomy

 Cranium:          Appears normal         Aortic Arch:      Not well visualized
 Fetal Cavum:      Appears normal         Ductal Arch:      Appears normal
 Ventricles:       Appears normal         Diaphragm:        Appears normal
 Choroid Plexus:   Appears normal         Stomach:          Appears normal, left
                                                            sided
 Cerebellum:       Appears normal         Abdomen:          Appears normal
 Posterior Fossa:  Appears normal         Abdominal Wall:   Not well visualized
 Nuchal Fold:      Not applicable (>20    Cord Vessels:     Appears normal (3
                   wks GA)                                  vessel cord)
 Face:             Orbits appear          Kidneys:          Appear normal
                   normal
 Lips:             Appears normal         Bladder:          Appears normal
 Heart:            Appears normal         Spine:            Limited views
                   (4CH, axis, and                          appear normal
                   situs)
 RVOT:             Appears normal         Lower             Appears normal
                                          Extremities:
 LVOT:             Appears normal         Upper             Appears normal
                                          Extremities:

 Other:  Fetus appears to be a female.
Cervix Uterus Adnexa

 Cervical Length:    3        cm

 Cervix:       Normal appearance by transabdominal scan. Appears
               closed, without funnelling.
Comments

 Ms. Lohany was seen today due to suspected lagging fetal
 growth by clinic ultrasound.  The patient has a history of
 preeclampsia at term s/p C-section for non reassuring fetal
 tracing.
Impression

 Single IUP at 24 [DATE] weeks
 Normal fetal anatomic survey.  Somewhat limited views of the
 fetal heart obtained (aortic arch)
 The estimated fetal weight today is at the 42nd %tile
 Normal amniotic fluid volume
Recommendations

 Recommend follow-up ultrasound examination in 4 weeks for
 interval growth

 questions or concerns.

## 2014-02-06 ENCOUNTER — Emergency Department (HOSPITAL_COMMUNITY)
Admission: EM | Admit: 2014-02-06 | Discharge: 2014-02-06 | Disposition: A | Discharge status: Home / Self Care | Payer: Medicaid Other | Attending: Emergency Medicine | Admitting: Emergency Medicine

## 2014-02-06 ENCOUNTER — Encounter (HOSPITAL_COMMUNITY): Payer: Self-pay | Admitting: Emergency Medicine

## 2014-02-06 DIAGNOSIS — R197 Diarrhea, unspecified: Secondary | ICD-10-CM

## 2014-02-06 DIAGNOSIS — E119 Type 2 diabetes mellitus without complications: Secondary | ICD-10-CM | POA: Insufficient documentation

## 2014-02-06 DIAGNOSIS — Z3202 Encounter for pregnancy test, result negative: Secondary | ICD-10-CM | POA: Insufficient documentation

## 2014-02-06 DIAGNOSIS — R519 Headache, unspecified: Secondary | ICD-10-CM

## 2014-02-06 DIAGNOSIS — R51 Headache: Secondary | ICD-10-CM

## 2014-02-06 DIAGNOSIS — Z8742 Personal history of other diseases of the female genital tract: Secondary | ICD-10-CM | POA: Insufficient documentation

## 2014-02-06 DIAGNOSIS — R110 Nausea: Secondary | ICD-10-CM

## 2014-02-06 DIAGNOSIS — G43909 Migraine, unspecified, not intractable, without status migrainosus: Secondary | ICD-10-CM | POA: Insufficient documentation

## 2014-02-06 LAB — URINALYSIS, ROUTINE W REFLEX MICROSCOPIC
BILIRUBIN URINE: NEGATIVE
Glucose, UA: NEGATIVE mg/dL
HGB URINE DIPSTICK: NEGATIVE
KETONES UR: NEGATIVE mg/dL
Leukocytes, UA: NEGATIVE
Nitrite: NEGATIVE
PROTEIN: NEGATIVE mg/dL
SPECIFIC GRAVITY, URINE: 1.026 (ref 1.005–1.030)
UROBILINOGEN UA: 1 mg/dL (ref 0.0–1.0)
pH: 7.5 (ref 5.0–8.0)

## 2014-02-06 LAB — COMPREHENSIVE METABOLIC PANEL
ALBUMIN: 3.2 g/dL — AB (ref 3.5–5.2)
ALK PHOS: 98 U/L (ref 39–117)
ALT: 16 U/L (ref 0–35)
AST: 18 U/L (ref 0–37)
BILIRUBIN TOTAL: 0.3 mg/dL (ref 0.3–1.2)
BUN: 8 mg/dL (ref 6–23)
CHLORIDE: 105 meq/L (ref 96–112)
CO2: 25 meq/L (ref 19–32)
Calcium: 8.6 mg/dL (ref 8.4–10.5)
Creatinine, Ser: 0.7 mg/dL (ref 0.50–1.10)
GFR calc Af Amer: >90 mL/min (ref >90)
Glucose, Bld: 88 mg/dL (ref 70–99)
POTASSIUM: 3.6 meq/L — AB (ref 3.7–5.3)
Sodium: 141 mEq/L (ref 137–147)
Total Protein: 7.6 g/dL (ref 6.0–8.3)

## 2014-02-06 LAB — CBC WITH DIFFERENTIAL/PLATELET
BASOS ABS: 0 10*3/uL (ref 0.0–0.1)
BASOS PCT: 0 % (ref 0–1)
EOS ABS: 0.2 10*3/uL (ref 0.0–0.7)
EOS PCT: 2 % (ref 0–5)
HCT: 36.4 % (ref 36.0–46.0)
Hemoglobin: 11.4 g/dL — ABNORMAL LOW (ref 12.0–15.0)
Lymphocytes Relative: 28 % (ref 12–46)
Lymphs Abs: 3.2 10*3/uL (ref 0.7–4.0)
MCH: 25.4 pg — AB (ref 26.0–34.0)
MCHC: 31.3 g/dL (ref 30.0–36.0)
MCV: 81.3 fL (ref 78.0–100.0)
Monocytes Absolute: 0.8 10*3/uL (ref 0.1–1.0)
Monocytes Relative: 7 % (ref 3–12)
Neutro Abs: 7.4 10*3/uL (ref 1.7–7.7)
Neutrophils Relative %: 63 % (ref 43–77)
PLATELETS: 385 10*3/uL (ref 150–400)
RBC: 4.48 MIL/uL (ref 3.87–5.11)
RDW: 15.9 % — AB (ref 11.5–15.5)
WBC: 11.6 10*3/uL — ABNORMAL HIGH (ref 4.0–10.5)

## 2014-02-06 LAB — LIPASE, BLOOD: Lipase: 22 U/L (ref 11–59)

## 2014-02-06 LAB — POCT PREGNANCY, URINE: Preg Test, Ur: NEGATIVE

## 2014-02-06 MED ORDER — ONDANSETRON HCL 8 MG PO TABS: 8.0000 mg | ORAL_TABLET | Freq: Three times a day (TID) | ORAL | Status: DC | PRN

## 2014-02-06 NOTE — Progress Notes (Signed)
[facility] provided pt with a list of primary care resources, [facility] information, and a [facility] application to help establish primary care.

## 2014-02-06 NOTE — Discharge Instructions (Signed)
Diarrhea Diarrhea is frequent loose and watery bowel movements. It can cause you to feel weak and dehydrated. Dehydration can cause you to become tired and thirsty, have a dry mouth, and have decreased urination that often is dark yellow. Diarrhea is a sign of another problem, most often an infection that will not last long. In most cases, diarrhea typically lasts 2 3 days. However, it can last longer if it is a sign of something more serious. It is important to treat your diarrhea as directed by your caregive to lessen or prevent future episodes of diarrhea. CAUSES  Some common causes include:  Gastrointestinal infections caused by viruses, bacteria, or parasites.  Food poisoning or food allergies.  Certain medicines, such as antibiotics, chemotherapy, and laxatives.  Artificial sweeteners and fructose.  Digestive disorders. HOME CARE INSTRUCTIONS  Ensure adequate fluid intake (hydration): have 1 cup (8 oz) of fluid for each diarrhea episode. Avoid fluids that contain simple sugars or sports drinks, fruit juices, whole milk products, and sodas. Your urine should be clear or pale yellow if you are drinking enough fluids. Hydrate with an oral rehydration solution that you can purchase at pharmacies, retail stores, and online. You can prepare an oral rehydration solution at home by mixing the following ingredients together:    tsp table salt.   tsp baking soda.   tsp salt substitute containing potassium chloride.  1  tablespoons sugar.  1 L (34 oz) of water.  Certain foods and beverages may increase the speed at which food moves through the gastrointestinal (GI) tract. These foods and beverages should be avoided and include:  Caffeinated and alcoholic beverages.  High-fiber foods, such as raw fruits and vegetables, nuts, seeds, and whole grain breads and cereals.  Foods and beverages sweetened with sugar alcohols, such as xylitol, sorbitol, and mannitol.  Some foods may be well  tolerated and may help thicken stool including:  Starchy foods, such as rice, toast, pasta, low-sugar cereal, oatmeal, grits, baked potatoes, crackers, and bagels.  Bananas.  Applesauce.  Add probiotic-rich foods to help increase healthy bacteria in the GI tract, such as yogurt and fermented milk products.  Wash your hands well after each diarrhea episode.  Only take over-the-counter or prescription medicines as directed by your caregiver.  Take a warm bath to relieve any burning or pain from frequent diarrhea episodes. SEEK IMMEDIATE MEDICAL CARE IF:   You are unable to keep fluids down.  You have persistent vomiting.  You have blood in your stool, or your stools are black and tarry.  You do not urinate in 6 8 hours, or there is only a small amount of very dark urine.  You have abdominal pain that increases or localizes.  You have weakness, dizziness, confusion, or lightheadedness.  You have a severe headache.  Your diarrhea gets worse or does not get better.  You have a fever or persistent symptoms for more than 2 3 days.  You have a fever and your symptoms suddenly get worse. MAKE SURE YOU:   Understand these instructions.  Will watch your condition.  Will get help right away if you are not doing well or get worse. Document Released: 12/05/2002 Document Revised: 12/01/2012 Document Reviewed: 08/22/2012 [facility] Patient Information 2014 [place], [place].  Diet for Diarrhea, Adult Frequent, runny stools (diarrhea) may be caused or worsened by food or drink. Diarrhea may be relieved by changing your diet. Since diarrhea can last up to 7 days, it is easy for you to lose too much  fluid from the body and become dehydrated. Fluids that are lost need to be replaced. Along with a modified diet, make sure you drink enough fluids to keep your urine clear or pale yellow. DIET INSTRUCTIONS  Ensure adequate fluid intake (hydration): have 1 cup (8 oz) of fluid for each diarrhea  episode. Avoid fluids that contain simple sugars or sports drinks, fruit juices, whole milk products, and sodas. Your urine should be clear or pale yellow if you are drinking enough fluids. Hydrate with an oral rehydration solution that you can purchase at pharmacies, retail stores, and online. You can prepare an oral rehydration solution at home by mixing the following ingredients together:    tsp table salt.   tsp baking soda.   tsp salt substitute containing potassium chloride.  1  tablespoons sugar.  1 L (34 oz) of water.  Certain foods and beverages may increase the speed at which food moves through the gastrointestinal (GI) tract. These foods and beverages should be avoided and include:  Caffeinated and alcoholic beverages.  High-fiber foods, such as raw fruits and vegetables, nuts, seeds, and whole grain breads and cereals.  Foods and beverages sweetened with sugar alcohols, such as xylitol, sorbitol, and mannitol.  Some foods may be well tolerated and may help thicken stool including:  Starchy foods, such as rice, toast, pasta, low-sugar cereal, oatmeal, grits, baked potatoes, crackers, and bagels.   Bananas.   Applesauce.  Add probiotic-rich foods to help increase healthy bacteria in the GI tract, such as yogurt and fermented milk products. RECOMMENDED FOODS AND BEVERAGES Starches Choose foods with less than 2 g of fiber per serving.  Recommended:  White, [place], and pita breads, plain rolls, buns, bagels. Plain muffins, matzo. Soda, saltine, or graham crackers. Pretzels, melba toast, zwieback. Cooked cereals made with water: cornmeal, farina, cream cereals. Dry cereals: refined corn, wheat, rice. Potatoes prepared any way without skins, refined macaroni, spaghetti, noodles, refined rice.  Avoid:  Bread, rolls, or crackers made with whole wheat, multi-grains, rye, bran seeds, nuts, or coconut. Corn tortillas or taco shells. Cereals containing whole grains, multi-grains,  bran, coconut, nuts, raisins. Cooked or dry oatmeal. Coarse wheat cereals, granola. Cereals advertised as "high-fiber." Potato skins. Whole grain pasta, wild or brown rice. Popcorn. Sweet potatoes, yams. Sweet rolls, doughnuts, waffles, pancakes, sweet breads. Vegetables  Recommended: Strained tomato and vegetable juices. Most well-cooked and canned vegetables without seeds. Fresh: Tender lettuce, cucumber without the skin, cabbage, spinach, bean sprouts.  Avoid: Fresh, cooked, or canned: Artichokes, baked beans, beet greens, broccoli, Brussels sprouts, corn, kale, legumes, peas, sweet potatoes. Cooked: Green or red cabbage, spinach. Avoid large servings of any vegetables because vegetables shrink when cooked, and they contain more fiber per serving than fresh vegetables. Fruit  Recommended: Cooked or canned: Apricots, applesauce, cantaloupe, cherries, fruit cocktail, grapefruit, grapes, kiwi, mandarin oranges, peaches, pears, plums, watermelon. Fresh: Apples without skin, ripe banana, grapes, cantaloupe, cherries, grapefruit, peaches, oranges, plums. Keep servings limited to  cup or 1 piece.  Avoid: Fresh: Apples with skin, apricots, mangoes, pears, raspberries, strawberries. Prune juice, stewed or dried prunes. Dried fruits, raisins, dates. Large servings of all fresh fruits. Protein  Recommended: Ground or well-cooked tender beef, ham, veal, lamb, pork, or poultry. Eggs. Fish, oysters, shrimp, lobster, other seafoods. Liver, organ meats.  Avoid: Tough, fibrous meats with gristle. Peanut butter, smooth or chunky. Cheese, nuts, seeds, legumes, dried peas, beans, lentils. Dairy  Recommended: Yogurt, lactose-free milk, kefir, drinkable yogurt, buttermilk, soy milk, or plain hard cheese.  Avoid: Milk, chocolate milk, beverages made with milk, such as milkshakes. Soups  Recommended: Bouillon, broth, or soups made from allowed foods. Any strained soup.  Avoid: Soups made from vegetables that  are not allowed, cream or milk-based soups. Desserts and Sweets  Recommended: Sugar-free gelatin, sugar-free frozen ice pops made without sugar alcohol.  Avoid: Plain cakes and cookies, pie made with fruit, pudding, custard, cream pie. Gelatin, fruit, ice, sherbet, frozen ice pops. Ice cream, ice milk without nuts. Plain hard candy, honey, jelly, molasses, syrup, sugar, chocolate syrup, gumdrops, marshmallows. Fats and Oils  Recommended: Limit fats to less than 8 tsp per day.  Avoid: Seeds, nuts, olives, avocados. Margarine, butter, cream, mayonnaise, salad oils, plain salad dressings. Plain gravy, crisp bacon without rind. Beverages  Recommended: Water, decaffeinated teas, oral rehydration solutions, sugar-free beverages not sweetened with sugar alcohols.  Avoid: Fruit juices, caffeinated beverages (coffee, tea, soda), alcohol, sports drinks, or lemon-lime soda. Condiments  Recommended: Ketchup, mustard, horseradish, vinegar, cocoa powder. Spices in moderation: allspice, basil, bay leaves, celery powder or leaves, cinnamon, cumin powder, curry powder, ginger, mace, marjoram, onion or garlic powder, oregano, paprika, parsley flakes, ground pepper, rosemary, sage, savory, tarragon, thyme, turmeric.  Avoid: Coconut, honey. Document Released: 03/06/2004 Document Revised: 09/08/2012 Document Reviewed: 04/30/2012 [facility] Patient Information 2014 [place], [place].  Headaches, Frequently Asked Questions MIGRAINE HEADACHES Q: What is migraine? What causes it? How can I treat it? A: Generally, migraine headaches begin as a dull ache. Then they develop into a constant, throbbing, and pulsating pain. You may experience pain at the temples. You may experience pain at the front or back of one or both sides of the head. The pain is usually accompanied by a combination of:  Nausea.  Vomiting.  Sensitivity to light and noise. Some people (about 15%) experience an aura (see below) before an attack.  The cause of migraine is believed to be chemical reactions in the brain. Treatment for migraine may include over-the-counter or prescription medications. It may also include self-help techniques. These include relaxation training and biofeedback.  Q: What is an aura? A: About 15% of people with migraine get an "aura". This is a sign of neurological symptoms that occur before a migraine headache. You may see wavy or jagged lines, dots, or flashing lights. You might experience tunnel vision or blind spots in one or both eyes. The aura can include visual or auditory hallucinations (something imagined). It may include disruptions in smell (such as strange odors), taste or touch. Other symptoms include:  Numbness.  A "pins and needles" sensation.  Difficulty in recalling or speaking the correct word. These neurological events may last as long as 60 minutes. These symptoms will fade as the headache begins. Q: What is a trigger? A: Certain physical or environmental factors can lead to or "trigger" a migraine. These include:  Foods.  Hormonal changes.  Weather.  Stress. It is important to remember that triggers are different for everyone. To help prevent migraine attacks, you need to figure out which triggers affect you. Keep a headache diary. This is a good way to track triggers. The diary will help you talk to your healthcare professional about your condition. Q: Does weather affect migraines? A: Bright sunshine, hot, humid conditions, and drastic changes in barometric pressure may lead to, or "trigger," a migraine attack in some people. But studies have shown that weather does not act as a trigger for everyone with migraines. Q: What is the link between migraine and hormones? A: Hormones start  and regulate many of your body's functions. Hormones keep your body in balance within a constantly changing environment. The levels of hormones in your body are unbalanced at times. Examples are during  menstruation, pregnancy, or menopause. That can lead to a migraine attack. In fact, about three quarters of all women with migraine report that their attacks are related to the menstrual cycle.  Q: Is there an increased risk of stroke for migraine sufferers? A: The likelihood of a migraine attack causing a stroke is very remote. That is not to say that migraine sufferers cannot have a stroke associated with their migraines. In persons under age 29, the most common associated factor for stroke is migraine headache. But over the course of a person's normal life span, the occurrence of migraine headache may actually be associated with a reduced risk of dying from cerebrovascular disease due to stroke.  Q: What are acute medications for migraine? A: Acute medications are used to treat the pain of the headache after it has started. Examples over-the-counter medications, NSAIDs, ergots, and triptans.  Q: What are the triptans? A: Triptans are the newest class of abortive medications. They are specifically targeted to treat migraine. Triptans are vasoconstrictors. They moderate some chemical reactions in the brain. The triptans work on receptors in your brain. Triptans help to restore the balance of a neurotransmitter called serotonin. Fluctuations in levels of serotonin are thought to be a main cause of migraine.  Q: Are over-the-counter medications for migraine effective? A: Over-the-counter, or "OTC," medications may be effective in relieving mild to moderate pain and associated symptoms of migraine. But you should see your caregiver before beginning any treatment regimen for migraine.  Q: What are preventive medications for migraine? A: Preventive medications for migraine are sometimes referred to as "prophylactic" treatments. They are used to reduce the frequency, severity, and length of migraine attacks. Examples of preventive medications include antiepileptic medications, antidepressants, beta-blockers,  calcium channel blockers, and NSAIDs (nonsteroidal anti-inflammatory drugs). Q: Why are anticonvulsants used to treat migraine? A: During the past few years, there has been an increased interest in antiepileptic drugs for the prevention of migraine. They are sometimes referred to as "anticonvulsants". Both epilepsy and migraine may be caused by similar reactions in the brain.  Q: Why are antidepressants used to treat migraine? A: Antidepressants are typically used to treat people with depression. They may reduce migraine frequency by regulating chemical levels, such as serotonin, in the brain.  Q: What alternative therapies are used to treat migraine? A: The term "alternative therapies" is often used to describe treatments considered outside the scope of conventional Western medicine. Examples of alternative therapy include acupuncture, acupressure, and yoga. Another common alternative treatment is herbal therapy. Some herbs are believed to relieve headache pain. Always discuss alternative therapies with your caregiver before proceeding. Some herbal products contain arsenic and other toxins. TENSION HEADACHES Q: What is a tension-type headache? What causes it? How can I treat it? A: Tension-type headaches occur randomly. They are often the result of temporary stress, anxiety, fatigue, or anger. Symptoms include soreness in your temples, a tightening band-like sensation around your head (a "vice-like" ache). Symptoms can also include a pulling feeling, pressure sensations, and contracting head and neck muscles. The headache begins in your forehead, temples, or the back of your head and neck. Treatment for tension-type headache may include over-the-counter or prescription medications. Treatment may also include self-help techniques such as relaxation training and biofeedback. CLUSTER HEADACHES Q: What is a cluster headache?  What causes it? How can I treat it? A: Cluster headache gets its name because the  attacks come in groups. The pain arrives with little, if any, warning. It is usually on one side of the head. A tearing or bloodshot eye and a runny nose on the same side of the headache may also accompany the pain. Cluster headaches are believed to be caused by chemical reactions in the brain. They have been described as the most severe and intense of any headache type. Treatment for cluster headache includes prescription medication and oxygen. SINUS HEADACHES Q: What is a sinus headache? What causes it? How can I treat it? A: When a cavity in the bones of the face and skull (a sinus) becomes inflamed, the inflammation will cause localized pain. This condition is usually the result of an allergic reaction, a tumor, or an infection. If your headache is caused by a sinus blockage, such as an infection, you will probably have a fever. An x-ray will confirm a sinus blockage. Your caregiver's treatment might include antibiotics for the infection, as well as antihistamines or decongestants.  REBOUND HEADACHES Q: What is a rebound headache? What causes it? How can I treat it? A: A pattern of taking acute headache medications too often can lead to a condition known as "rebound headache." A pattern of taking too much headache medication includes taking it more than 2 days per week or in excessive amounts. That means more than the label or a caregiver advises. With rebound headaches, your medications not only stop relieving pain, they actually begin to cause headaches. Doctors treat rebound headache by tapering the medication that is being overused. Sometimes your caregiver will gradually substitute a different type of treatment or medication. Stopping may be a challenge. Regularly overusing a medication increases the potential for serious side effects. Consult a caregiver if you regularly use headache medications more than 2 days per week or more than the label advises. ADDITIONAL QUESTIONS AND ANSWERS Q: What is  biofeedback? A: Biofeedback is a self-help treatment. Biofeedback uses special equipment to monitor your body's involuntary physical responses. Biofeedback monitors:  Breathing.  Pulse.  Heart rate.  Temperature.  Muscle tension.  Brain activity. Biofeedback helps you refine and perfect your relaxation exercises. You learn to control the physical responses that are related to stress. Once the technique has been mastered, you do not need the equipment any more. Q: Are headaches hereditary? A: Four out of five (80%) of people that suffer report a family history of migraine. Scientists are not sure if this is genetic or a family predisposition. Despite the uncertainty, a child has a 50% chance of having migraine if one parent suffers. The child has a 75% chance if both parents suffer.  Q: Can children get headaches? A: By the time they reach high school, most young people have experienced some type of headache. Many safe and effective approaches or medications can prevent a headache from occurring or stop it after it has begun.  Q: What type of doctor should I see to diagnose and treat my headache? A: Start with your primary caregiver. Discuss his or her experience and approach to headaches. Discuss methods of classification, diagnosis, and treatment. Your caregiver may decide to recommend you to a headache specialist, depending upon your symptoms or other physical conditions. Having diabetes, allergies, etc., may require a more comprehensive and inclusive approach to your headache. The [employer] will provide, upon request, a list of [facility] physician members in your  state. Document Released: 03/06/2004 Document Revised: 03/08/2012 Document Reviewed: 08/14/2008 [facility] Patient Information 2014 [place], [place].

## 2014-02-06 NOTE — ED Notes (Signed)
Pt states she ate at [employer] last night and developed nausea and diarrhea. No emesis. Pt states he has also has had a migraine for the past week.

## 2014-02-06 NOTE — ED Provider Notes (Signed)
CSN: [id number]     Arrival date & time 02/06/14  1111 History   First MD Initiated Contact with Patient 02/06/14 1307     Chief Complaint  Patient presents with  . Diarrhea  . Migraine     (Consider location/radiation/quality/duration/timing/severity/associated sxs/prior Treatment) Patient is a 28 y.o. female presenting with diarrhea and migraines. The history is provided by the patient.  Diarrhea Migraine   She presents for evaluation of diarrhea that started last night after eating at a restaurant. She has had nausea without vomiting. She has a secondary complaint of "migraine headache" for 2 weeks. She denies stress, fever, chills, paresthesias, or weakness. She's never been formally evaluated for migraine disorder. She's not had a medication. There are no other known modifying factors.  Past Medical History  Diagnosis Date  . Preeclampsia   . Diabetes mellitus without complication   . Pregnancy induced hypertension   . Gestational diabetes   . Fetal demise 05/19/2013  . S/P cesarean section 05/20/2013   Past Surgical History  Procedure Laterality Date  . Cesarean section    . Toenail excision    . Wisdom tooth extraction    . Cesarean section N/A 05/19/2013    Procedure: CESAREAN SECTION;  Surgeon: Sherron Monday, MD;  Location: [facility];  Service: Obstetrics;  Laterality: N/A;   History reviewed. No pertinent family history. History  Substance Use Topics  . Smoking status: Never Smoker   . Smokeless tobacco: Never Used  . Alcohol Use: No   OB History   Grav Para Term Preterm Abortions TAB SAB Ect Mult Living   2 2 2  0 0 0 0 0 0 1     Review of Systems  Gastrointestinal: Positive for diarrhea.  All other systems reviewed and are negative.      Allergies  Review of patient's allergies indicates no known allergies.  Home Medications   Current Outpatient Rx  Name  Route  Sig  Dispense  Refill  . aspirin-acetaminophen-caffeine (EXCEDRIN MIGRAINE) 250-250-65 MG  per tablet   Oral   Take 2 tablets by mouth every 6 (six) hours as needed for headache or migraine.         . ondansetron (ZOFRAN) 8 MG tablet   Oral   Take 1 tablet (8 mg total) by mouth every 8 (eight) hours as needed for nausea or vomiting.   20 tablet   0    BP 118/67  Pulse 65  Temp(Src) 98.6 F (37 C) (Oral)  Resp 14  SpO2 100%  LMP 08/23/2012 Physical Exam  Nursing note and vitals reviewed. Constitutional: She is oriented to person, place, and time. She appears well-developed and well-nourished.  HENT:  Head: Normocephalic and atraumatic.  Eyes: Conjunctivae and EOM are normal. Pupils are equal, round, and reactive to light.  Neck: Normal range of motion and phonation normal. Neck supple.  No meningismus  Cardiovascular: Normal rate, regular rhythm and intact distal pulses.   Pulmonary/Chest: Effort normal and breath sounds normal. She exhibits no tenderness.  Abdominal: Soft. She exhibits no distension. There is no tenderness. There is no guarding.  Musculoskeletal: Normal range of motion.  Neurological: She is alert and oriented to person, place, and time. No cranial nerve deficit. She exhibits normal muscle tone. Coordination normal.  Skin: Skin is warm and dry.  Psychiatric: She has a normal mood and affect. Her behavior is normal. Judgment and thought content normal.    ED Course  Procedures (including critical care time)  Medications -  No data to display  No data found.      Labs Review Labs Reviewed  COMPREHENSIVE METABOLIC PANEL - Abnormal; Notable for the following:    Potassium 3.6 (*)    Albumin 3.2 (*)    All other components within normal limits  CBC WITH DIFFERENTIAL - Abnormal; Notable for the following:    WBC 11.6 (*)    Hemoglobin 11.4 (*)    MCH 25.4 (*)    RDW 15.9 (*)    All other components within normal limits  URINALYSIS, ROUTINE W REFLEX MICROSCOPIC - Abnormal; Notable for the following:    APPearance HAZY (*)    All other  components within normal limits  LIPASE, BLOOD  POCT PREGNANCY, URINE   Imaging Review No results found.  EKG Interpretation   None       MDM   Final diagnoses:  Diarrhea  Nausea  Headache    Nonspecific diarrhea, without vomiting. Possible food poisoning, versus enteritis. Doubt serious bacterial infection, metabolic instability, or impending vascular collapse.  Nursing Notes Reviewed/ Care Coordinated Applicable Imaging Reviewed Interpretation of Laboratory Data incorporated into ED treatment  The patient appears reasonably screened and/or stabilized for discharge and I doubt any other medical condition or other [facility] requiring further screening, evaluation, or treatment in the ED at this time prior to discharge.  Plan: Home Medications- Zofran, Kaopectate; Home Treatments- rest, gradually advance diet; return here if the recommended treatment, does not improve the symptoms; Recommended follow up- PCP of choice when necessary, urology referral for headache evaluation     Flint MelterElliott L Laterrance Nauta, MD 02/08/14 1409

## 2014-02-06 NOTE — ED Notes (Signed)
Pt given d/s instructions and a note for work and left with one prescrip.

## 2014-02-06 NOTE — Progress Notes (Addendum)
   CARE MANAGEMENT ED NOTE 02/06/2014  Patient:  Audrey [name],Audrey [name]   Account Number:  [id number]  Date Initiated:  02/06/2014  Documentation initiated by:  Edd ArbourGIBBS,Inayah Woodin  Subjective/Objective Assessment:   28 yr old female pt Pt confirms no medicaid since loss of daughter in 2014. Confirms not coverage and inquired about "obamacare"     Subjective/Objective Assessment Detail:     Action/Plan:   ED CM spoke with pt and provided her with resources including [employer] contact number for "obamacare" CM discussed 02/12/14 deadline see below notes   Action/Plan Detail:   Anticipated DC Date:  02/06/2014     Status Recommendation to Physician:   Result of Recommendation:    Other ED Services  Consult Working Plan    DC Planning Services  Other  PCP issues  Outpatient Services - Pt will follow up    Choice offered to / List presented to:            Status of service:  Completed, signed off  ED Comments:   ED Comments Detail:  CM spoke with [place] who [place]. CM discussed and provided written information for self pay pcps, importance of pcp for f/u care, www.needymeds.org, discounted pharmacies and other [place] resources such as financial assistance, DSS and  health department  Reviewed resources for [place] self [place] pcps like [facility], [facility] at [address], [facility], general medical clinics, [facility] plus others, [facility] out patient pharmacies and housing Pt voiced understanding and appreciation of resources provided

## 2014-03-18 ENCOUNTER — Encounter (HOSPITAL_COMMUNITY): Payer: Self-pay

## 2014-03-18 ENCOUNTER — Inpatient Hospital Stay (HOSPITAL_COMMUNITY)
Admission: AD | Admit: 2014-03-18 | Discharge: 2014-03-18 | Disposition: A | Discharge status: Home / Self Care | Payer: Medicaid Other | Source: Ambulatory Visit | Attending: Obstetrics & Gynecology | Admitting: Obstetrics & Gynecology

## 2014-03-18 DIAGNOSIS — Z3201 Encounter for pregnancy test, result positive: Secondary | ICD-10-CM | POA: Insufficient documentation

## 2014-03-18 LAB — POCT PREGNANCY, URINE: PREG TEST UR: POSITIVE — AB

## 2014-03-18 NOTE — Discharge Instructions (Signed)
Pregnancy - First Trimester  During sexual intercourse, millions of sperm go into the vagina. Only 1 sperm will penetrate and fertilize the female egg while it is in the Fallopian tube. One week later, the fertilized egg implants into the wall of the uterus. An embryo begins to develop into a baby. At 6 to 8 weeks, the eyes and face are formed and the heartbeat can be seen on ultrasound. At the end of 12 weeks (first trimester), all the baby's organs are formed. Now that you are pregnant, you will want to do everything you can to have a healthy baby. Two of the most important things are to get good prenatal care and follow your caregiver's instructions. Prenatal care is all the medical care you receive before the baby's birth. It is given to prevent, find, and treat problems during the pregnancy and childbirth.  PRENATAL EXAMS  · During prenatal visits, your weight, blood pressure, and urine are checked. This is done to make sure you are healthy and progressing normally during the pregnancy.  · A pregnant woman should gain 25 to 35 pounds during the pregnancy. However, if you are overweight or underweight, your caregiver will advise you regarding your weight.  · Your caregiver will ask and answer questions for you.  · Blood work, cervical cultures, other necessary tests, and a Pap test are done during your prenatal exams. These tests are done to check on your health and the probable health of your baby. Tests are strongly recommended and done for HIV with your permission. This is the virus that causes AIDS. These tests are done because medicines can be given to help prevent your baby from being born with this infection should you have been infected without knowing it. Blood work is also used to find out your blood type, previous infections, and follow your blood levels (hemoglobin).  · Low hemoglobin (anemia) is common during pregnancy. Iron and vitamins are given to help prevent this. Later in the pregnancy, blood  tests for diabetes will be done along with any other tests if any problems develop.  · You may need other tests to make sure you and the baby are doing well.  CHANGES DURING THE FIRST TRIMESTER   Your body goes through many changes during pregnancy. They vary from person to person. Talk to your caregiver about changes you notice and are concerned about. Changes can include:  · Your menstrual period stops.  · The egg and sperm carry the genes that determine what you look like. Genes from you and your partner are forming a baby. The female genes determine whether the baby is a boy or a girl.  · Your body increases in girth and you may feel bloated.  · Feeling sick to your stomach (nauseous) and throwing up (vomiting). If the vomiting is uncontrollable, call your caregiver.  · Your breasts will begin to enlarge and become tender.  · Your nipples may stick out more and become darker.  · The need to urinate more. Painful urination may mean you have a bladder infection.  · Tiring easily.  · Loss of appetite.  · Cravings for certain kinds of food.  · At first, you may gain or lose a couple of pounds.  · You may have changes in your emotions from day to day (excited to be pregnant or concerned something may go wrong with the pregnancy and baby).  · You may have more vivid and strange dreams.  HOME CARE INSTRUCTIONS   ·   It is very important to avoid all smoking, alcohol and non-prescribed drugs during your pregnancy. These affect the formation and growth of the baby. Avoid chemicals while pregnant to ensure the delivery of a healthy infant.  · Start your prenatal visits by the 12th week of pregnancy. They are usually scheduled monthly at first, then more often in the last 2 months before delivery. Keep your caregiver's appointments. Follow your caregiver's instructions regarding medicine use, blood and lab tests, exercise, and diet.  · During pregnancy, you are providing food for you and your baby. Eat regular, well-balanced  meals. Choose foods such as meat, fish, milk and other low fat dairy products, vegetables, fruits, and whole-grain breads and cereals. Your caregiver will tell you of the ideal weight gain.  · You can help morning sickness by keeping soda crackers at the bedside. Eat a couple before arising in the morning. You may want to use the crackers without salt on them.  · Eating 4 to 5 small meals rather than 3 large meals a day also may help the nausea and vomiting.  · Drinking liquids between meals instead of during meals also seems to help nausea and vomiting.  · A physical sexual relationship may be continued throughout pregnancy if there are no other problems. Problems may be early (premature) leaking of amniotic fluid from the membranes, vaginal bleeding, or belly (abdominal) pain.  · Exercise regularly if there are no restrictions. Check with your caregiver or physical therapist if you are unsure of the safety of some of your exercises. Greater weight gain will occur in the last 2 trimesters of pregnancy. Exercising will help:  · Control your weight.  · Keep you in shape.  · Prepare you for labor and delivery.  · Help you lose your pregnancy weight after you deliver your baby.  · Wear a good support or jogging bra for breast tenderness during pregnancy. This may help if worn during sleep too.  · Ask when prenatal classes are available. Begin classes when they are offered.  · Do not use hot tubs, steam rooms, or saunas.  · Wear your seat belt when driving. This protects you and your baby if you are in an accident.  · Avoid raw meat, uncooked cheese, cat litter boxes, and soil used by cats throughout the pregnancy. These carry germs that can cause birth defects in the baby.  · The first trimester is a good time to visit your dentist for your dental health. Getting your teeth cleaned is okay. Use a softer toothbrush and brush gently during pregnancy.  · Ask for help if you have financial, counseling, or nutritional needs  during pregnancy. Your caregiver will be able to offer counseling for these needs as well as refer you for other special needs.  · Do not take any medicines or herbs unless told by your caregiver.  · Inform your caregiver if there is any mental or physical domestic violence.  · Make a list of emergency phone numbers of family, friends, hospital, and police and fire departments.  · Write down your questions. Take them to your prenatal visit.  · Do not douche.  · Do not cross your legs.  · If you have to stand for long periods of time, rotate you feet or take small steps in a circle.  · You may have more vaginal secretions that may require a sanitary pad. Do not use tampons or scented sanitary pads.  MEDICINES AND DRUG USE IN PREGNANCY  ·   Take prenatal vitamins as directed. The vitamin should contain 1 milligram of folic acid. Keep all vitamins out of reach of children. Only a couple vitamins or tablets containing iron may be fatal to a baby or young child when ingested.  · Avoid use of all medicines, including herbs, over-the-counter medicines, not prescribed or suggested by your caregiver. Only take over-the-counter or prescription medicines for pain, discomfort, or fever as directed by your caregiver. Do not use aspirin, ibuprofen, or naproxen unless directed by your caregiver.  · Let your caregiver also know about herbs you may be using.  · Alcohol is related to a number of birth defects. This includes fetal alcohol syndrome. All alcohol, in any form, should be avoided completely. Smoking will cause low birth rate and premature babies.  · Street or illegal drugs are very harmful to the baby. They are absolutely forbidden. A baby born to an addicted mother will be addicted at birth. The baby will go through the same withdrawal an adult does.  · Let your caregiver know about any medicines that you have to take and for what reason you take them.  SEEK MEDICAL CARE IF:   You have any concerns or worries during your  pregnancy. It is better to call with your questions if you feel they cannot wait, rather than worry about them.  SEEK IMMEDIATE MEDICAL CARE IF:   · An unexplained oral temperature above 102° F (38.9° C) develops, or as your caregiver suggests.  · You have leaking of fluid from the vagina (birth canal). If leaking membranes are suspected, take your temperature and inform your caregiver of this when you call.  · There is vaginal spotting or bleeding. Notify your caregiver of the amount and how many pads are used.  · You develop a bad smelling vaginal discharge with a change in the color.  · You continue to feel sick to your stomach (nauseated) and have no relief from remedies suggested. You vomit blood or coffee ground-like materials.  · You lose more than 2 pounds of weight in 1 week.  · You gain more than 2 pounds of weight in 1 week and you notice swelling of your face, hands, feet, or legs.  · You gain 5 pounds or more in 1 week (even if you do not have swelling of your hands, face, legs, or feet).  · You get exposed to German measles and have never had them.  · You are exposed to fifth disease or chickenpox.  · You develop belly (abdominal) pain. Round ligament discomfort is a common non-cancerous (benign) cause of abdominal pain in pregnancy. Your caregiver still must evaluate this.  · You develop headache, fever, diarrhea, pain with urination, or shortness of breath.  · You fall or are in a car accident or have any kind of trauma.  · There is mental or physical violence in your home.  Document Released: 12/09/2001 Document Revised: 09/08/2012 Document Reviewed: 06/12/2009  ExitCare® Patient Information ©2014 [employer].

## 2014-03-18 NOTE — MAU Note (Signed)
Pt states here for positive pregnancy test. Denies pain or bleeding. Here for verification letter only.

## 2014-03-18 NOTE — MAU Provider Note (Signed)
CC:  Client here for a pregnancy test to get a verification of pregnancy S:  No pain, no bleeding, no problems.  Just wants pregnancy verification.  Pregnancy Test - Positive  Letter of verification given.   Advised to begin prenatal care as soon as possible.  Audrey Bernheimerri Dayron Odland, NP

## 2014-05-02 DIAGNOSIS — Z141 Cystic fibrosis carrier: Secondary | ICD-10-CM | POA: Insufficient documentation

## 2014-05-02 LAB — OB RESULTS CONSOLE GC/CHLAMYDIA
Chlamydia: NEGATIVE
GC PROBE AMP, GENITAL: NEGATIVE

## 2014-05-02 LAB — OB RESULTS CONSOLE RPR: RPR: NONREACTIVE

## 2014-05-02 LAB — OB RESULTS CONSOLE RUBELLA ANTIBODY, IGM: Rubella: IMMUNE

## 2014-05-02 LAB — OB RESULTS CONSOLE HEPATITIS B SURFACE ANTIGEN: HEP B S AG: NEGATIVE

## 2014-05-02 LAB — OB RESULTS CONSOLE HIV ANTIBODY (ROUTINE TESTING): HIV: NONREACTIVE

## 2014-05-14 ENCOUNTER — Inpatient Hospital Stay (HOSPITAL_COMMUNITY)
Admission: AD | Admit: 2014-05-14 | Discharge: 2014-05-14 | Disposition: A | Discharge status: Home / Self Care | Payer: Medicaid Other | Source: Ambulatory Visit | Attending: Obstetrics and Gynecology | Admitting: Obstetrics and Gynecology

## 2014-05-14 ENCOUNTER — Encounter (HOSPITAL_COMMUNITY): Payer: Self-pay | Admitting: *Deleted

## 2014-05-14 DIAGNOSIS — O210 Mild hyperemesis gravidarum: Secondary | ICD-10-CM | POA: Insufficient documentation

## 2014-05-14 DIAGNOSIS — O219 Vomiting of pregnancy, unspecified: Secondary | ICD-10-CM

## 2014-05-14 LAB — URINE MICROSCOPIC-ADD ON

## 2014-05-14 LAB — URINALYSIS, ROUTINE W REFLEX MICROSCOPIC
BILIRUBIN URINE: NEGATIVE
Glucose, UA: NEGATIVE mg/dL
Ketones, ur: NEGATIVE mg/dL
NITRITE: NEGATIVE
PH: 6.5 (ref 5.0–8.0)
Protein, ur: NEGATIVE mg/dL
SPECIFIC GRAVITY, URINE: 1.025 (ref 1.005–1.030)
Urobilinogen, UA: 1 mg/dL (ref 0.0–1.0)

## 2014-05-14 NOTE — Discharge Instructions (Signed)
Morning Sickness °Morning sickness is when you feel sick to your stomach (nauseous) during pregnancy. This nauseous feeling may or may not come with vomiting. It often occurs in the morning but can be a problem any time of day. Morning sickness is most common during the first trimester, but it may continue throughout pregnancy. While morning sickness is unpleasant, it is usually harmless unless you develop severe and continual vomiting (hyperemesis gravidarum). This condition requires more intense treatment.  °CAUSES  °The cause of morning sickness is not completely known but seems to be related to normal hormonal changes that occur in pregnancy. °RISK FACTORS °You are at greater risk if you: °· Experienced nausea or vomiting before your pregnancy. °· Had morning sickness during a previous pregnancy. °· Are pregnant with more than one baby, such as twins. °TREATMENT  °Do not use any medicines (prescription, over-the-counter, or herbal) for morning sickness without first talking to your health care provider. Your health care provider may prescribe or recommend: °· Vitamin B6 supplements. °· Anti-nausea medicines. °· The herbal medicine ginger. °HOME CARE INSTRUCTIONS  °· Only take over-the-counter or prescription medicines as directed by your health care provider. °· Taking multivitamins before getting pregnant can prevent or decrease the severity of morning sickness in most women.   °· Eat a piece of dry toast or unsalted crackers before getting out of bed in the morning.   °· Eat five or six small meals a day.   °· Eat dry and bland foods (rice, baked potato ). Foods high in carbohydrates are often helpful.  °· Do not drink liquids with your meals. Drink liquids between meals.   °· Avoid greasy, fatty, and spicy foods.   °· Get someone to cook for you if the smell of any food causes nausea and vomiting.   °· If you feel nauseous after taking prenatal vitamins, take the vitamins at night or with a snack.  °· Snack  on protein foods (nuts, yogurt, cheese) between meals if you are hungry.   °· Eat unsweetened gelatins for desserts.   °· Wearing an acupressure wristband (worn for sea sickness) may be helpful.   °· Acupuncture may be helpful.   °· Do not smoke.   °· Get a humidifier to keep the air in your house free of odors.   °· Get plenty of fresh air. °SEEK MEDICAL CARE IF:  °· Your home remedies are not working, and you need medicine. °· You feel dizzy or lightheaded. °· You are losing weight. °SEEK IMMEDIATE MEDICAL CARE IF:  °· You have persistent and uncontrolled nausea and vomiting. °· You pass out (faint). °Document Released: 02/05/2007 Document Revised: 08/17/2013 Document Reviewed: 06/01/2013 °ExitCare® Patient Information ©2014 [employer]. ° °

## 2014-05-14 NOTE — MAU Provider Note (Signed)
History     CSN: [id number]  Arrival date and time: 05/14/14 1527   First Provider Initiated Contact with Patient 05/14/14 1559      Chief Complaint  Patient presents with  . Emesis  . Epistaxis   HPI Comments: Audrey [name] 28 y.o. 484w5d G3P2001 presents to MAU for nausea and vomiting. She has been vomiting up to 6 x per day. She takes Diclegis only for nausea but she has an rx for phenergan. She has +FHT today. She has an appointment in office on Wednesday.   Emesis   Epistaxis       Past Medical History  Diagnosis Date  . Preeclampsia   . Diabetes mellitus without complication   . Pregnancy induced hypertension   . Gestational diabetes   . Fetal demise 05/19/2013  . S/P cesarean section 05/20/2013    Past Surgical History  Procedure Laterality Date  . Cesarean section    . Toenail excision    . Wisdom tooth extraction    . Cesarean section N/A 05/19/2013    Procedure: CESAREAN SECTION;  Surgeon: Sherron MondayJody Bovard, MD;  Location: [facility];  Service: Obstetrics;  Laterality: N/A;    History reviewed. No pertinent family history.  History  Substance Use Topics  . Smoking status: Never Smoker   . Smokeless tobacco: Never Used  . Alcohol Use: No    Allergies: No Known Allergies  Prescriptions prior to admission  Medication Sig Dispense Refill  . acetaminophen (TYLENOL) 500 MG tablet Take 1,000 mg by mouth every 6 (six) hours as needed for headache.      . benzocaine (ORAJEL) 10 % mucosal gel Use as directed 1 application in the mouth or throat as needed for mouth pain.      . Doxylamine-Pyridoxine (DICLEGIS) 10-10 MG TBEC Take 10 mg by mouth 3 (three) times daily as needed.      . Prenatal Vit-Fe Fumarate-FA (PRENATAL MULTIVITAMIN) TABS tablet Take 1 tablet by mouth daily at 12 noon.      . promethazine (PHENERGAN) 25 MG tablet Take 25 mg by mouth every 6 (six) hours as needed for nausea or vomiting.      . ondansetron (ZOFRAN) 8 MG tablet Take 1 tablet (8 mg total)  by mouth every 8 (eight) hours as needed for nausea or vomiting.  20 tablet  0    Review of Systems  Constitutional: Negative.   HENT: Positive for nosebleeds.   Respiratory: Negative.   Cardiovascular: Negative.   Gastrointestinal: Positive for nausea and vomiting.  Genitourinary: Negative.   Musculoskeletal: Negative.   Skin: Negative.   Neurological: Negative.   Psychiatric/Behavioral: The patient is nervous/anxious.    Physical Exam   Blood pressure 129/70, pulse 93, temperature 98.2 F (36.8 C), resp. rate 18, last menstrual period 01/24/2014, unknown if currently breastfeeding.  Physical Exam  Constitutional: She is oriented to person, place, and time. She appears well-developed and well-nourished. No distress.  HENT:  Head: Normocephalic and atraumatic.  Eyes: Pupils are equal, round, and reactive to light.  Cardiovascular: Normal rate, regular rhythm and normal heart sounds.   Respiratory: Effort normal and breath sounds normal. No respiratory distress.  GI: Soft. Bowel sounds are normal. She exhibits no distension. There is no tenderness. There is no rebound and no guarding.  Genitourinary:  Not examined  Musculoskeletal: Normal range of motion.  Neurological: She is alert and oriented to person, place, and time.  Skin: Skin is warm and dry.  Psychiatric: She has a  normal mood and affect. Her behavior is normal. Judgment and thought content normal.   Results for orders placed during the hospital encounter of 05/14/14 (from the past 24 hour(s))  URINALYSIS, ROUTINE W REFLEX MICROSCOPIC     Status: Abnormal   Collection Time    05/14/14  3:40 PM      Result Value Ref Range   Color, Urine YELLOW  YELLOW   APPearance HAZY (*) CLEAR   Specific Gravity, Urine 1.025  1.005 - 1.030   pH 6.5  5.0 - 8.0   Glucose, UA NEGATIVE  NEGATIVE mg/dL   Hgb urine dipstick TRACE (*) NEGATIVE   Bilirubin Urine NEGATIVE  NEGATIVE   Ketones, ur NEGATIVE  NEGATIVE mg/dL   Protein, ur  NEGATIVE  NEGATIVE mg/dL   Urobilinogen, UA 1.0  0.0 - 1.0 mg/dL   Nitrite NEGATIVE  NEGATIVE   Leukocytes, UA SMALL (*) NEGATIVE  URINE MICROSCOPIC-ADD ON     Status: Abnormal   Collection Time    05/14/14  3:40 PM      Result Value Ref Range   Squamous Epithelial / LPF MANY (*) RARE   WBC, UA 7-10  <3 WBC/hpf   RBC / HPF 0-2  <3 RBC/hpf   Bacteria, UA FEW (*) RARE   Urine-Other MUCOUS PRESENT      MAU Course  Procedures  MDM  UA, Urine culture  Assessment and Plan   A: Nausea in pregnancy  P: Advised to use phenergan 25 mg q6 hours as needed Keep appointment on Wednesday Will advise of culture results  Doralee AlbinoLinda M Hokulani Rogel 05/14/2014, 4:18 PM

## 2014-05-14 NOTE — MAU Note (Signed)
Pt presents to MAU with complaints of nausea and vomiting for approximately 2 weeks. She is currently taking diclegis for nausea. Nose bleed this morning.

## 2014-05-15 LAB — URINE CULTURE
Colony Count: NO GROWTH
Culture: NO GROWTH

## 2014-07-05 ENCOUNTER — Encounter: Payer: Medicaid Other | Attending: Obstetrics and Gynecology

## 2014-07-05 VITALS — Ht 67.5 in | Wt 303.7 lb

## 2014-07-05 DIAGNOSIS — O24419 Gestational diabetes mellitus in pregnancy, unspecified control: Secondary | ICD-10-CM

## 2014-07-05 DIAGNOSIS — Z713 Dietary counseling and surveillance: Secondary | ICD-10-CM | POA: Insufficient documentation

## 2014-07-05 DIAGNOSIS — O99810 Abnormal glucose complicating pregnancy: Secondary | ICD-10-CM | POA: Diagnosis present

## 2014-07-05 NOTE — Progress Notes (Signed)
  Patient was seen on 07/05/14 for Gestational Diabetes self-management class at the [facility]. The following learning objectives were met by the patient during this course:   States the definition of Gestational Diabetes  States why dietary management is important in controlling blood glucose  Describes the effects of carbohydrates on blood glucose levels  Demonstrates ability to create a balanced meal plan  Demonstrates carbohydrate counting   States when to check blood glucose levels  Demonstrates proper blood glucose monitoring techniques  States the effect of stress and exercise on blood glucose levels  States the importance of limiting caffeine and abstaining from alcohol and smoking  Plan:  Aim for 2 Carb Choices per meal (30 grams) +/- 1 either way for breakfast Aim for 3 Carb Choices per meal (45 grams) +/- 1 either way from lunch and dinner Aim for 1-2 Carbs per snack Begin reading food labels for Total Carbohydrate and sugar grams of foods Consider  increasing your activity level by walking daily as tolerated Begin checking BG before breakfast and 1 hours after first bit of breakfast, lunch and dinner after  as directed by MD  Take medication  as directed by MD  Blood glucose monitor given: Accu Chek Nano BG Monitoring Kit Lot # [id number] Exp: 06/28/15 Blood glucose reading: $RemoveBeforeDE'77mg'OWuRpSCQVrQAHET$ /dl  Patient instructed to monitor glucose levels: FBS: 60 - <90 1 hour: <140  Patient received the following handouts:  Nutrition Diabetes and Pregnancy  Carbohydrate Counting List  Meal Planning worksheet  Patient will be seen for follow-up as needed.

## 2014-07-12 DIAGNOSIS — G43909 Migraine, unspecified, not intractable, without status migrainosus: Secondary | ICD-10-CM | POA: Insufficient documentation

## 2014-07-20 ENCOUNTER — Telehealth: Payer: Self-pay | Admitting: Neurology

## 2014-07-20 ENCOUNTER — Ambulatory Visit (INDEPENDENT_AMBULATORY_CARE_PROVIDER_SITE_OTHER): Payer: Medicaid Other | Admitting: Neurology

## 2014-07-20 ENCOUNTER — Encounter: Payer: Self-pay | Admitting: Neurology

## 2014-07-20 VITALS — BP 105/73 | HR 79 | Ht 69.0 in | Wt 306.0 lb

## 2014-07-20 DIAGNOSIS — R51 Headache: Secondary | ICD-10-CM | POA: Insufficient documentation

## 2014-07-20 DIAGNOSIS — R519 Headache, unspecified: Secondary | ICD-10-CM | POA: Insufficient documentation

## 2014-07-20 MED ORDER — MAGNESIUM OXIDE 400 MG PO TABS: 400.0000 mg | ORAL_TABLET | Freq: Every day | ORAL | Status: DC

## 2014-07-20 NOTE — Progress Notes (Signed)
[facility]    Provider:  Dr Hosie PoissonSumner Referring Provider: Sherian ReinBovard-Stuckert, Jody, * Primary Care Physician:  No primary provider on file.  CC: headache in pregnancy  HPI:  Audrey [name] is a 28 y.o. female here as a referral from Dr. Hinton RaoBovard-Stuckert for headache evaluation in pregnancy.  Currently [redacted] weeks pregnant, currently going well, has gestational diabetes but no problems with pre-eclampsia. Has had headaches since age 28. Currently having 3 headaches a week. States that they are a little more severe than prior to pregnancy. Are typically frontal or left sided. Described as a squeezing pulsating type pain, can get to a 10/10 at its worst. Can last few hours to multiple days. +Nausea and emesis. +Photo and phonophobia. No focal motor or sensory changes. No change in vision, no blurry vision, no transient obscurations. Had similar type of headache with her prior pregnancy.   Currently taking Vicodin 5-300 for the headache, does not seem to help.   Review of Systems: Out of a complete 14 system review, the patient complains of only the following symptoms, and all other reviewed systems are negative. + headache, snoring, birth marks  History   Social History  . Marital Status: Single    Spouse Name: N/A    Number of Children: 1  . Years of Education: 12+   Occupational History  .     Social History Main Topics  . Smoking status: Never Smoker   . Smokeless tobacco: Never Used  . Alcohol Use: No  . Drug Use: No  . Sexual Activity: Yes    Birth Control/ Protection: None   Other Topics Concern  . Not on file   Social History Narrative   Patient lives at home with daughter.    Patient has has 1 child and one on the way.    Patient has some college education.    Patient works at [employer].           Family History  Problem Relation Age of Onset  . Asthma Other   . Hyperlipidemia Other   . Obesity Other     Past Medical History  Diagnosis  Date  . Preeclampsia   . Diabetes mellitus without complication   . Pregnancy induced hypertension   . Gestational diabetes   . Fetal demise 05/19/2013  . S/P cesarean section 05/20/2013    Past Surgical History  Procedure Laterality Date  . Cesarean section    . Toenail excision    . Wisdom tooth extraction    . Cesarean section N/A 05/19/2013    Procedure: CESAREAN SECTION;  Surgeon: Sherron MondayJody Bovard, MD;  Location: [facility];  Service: Obstetrics;  Laterality: N/A;    Current Outpatient Prescriptions  Medication Sig Dispense Refill  . acetaminophen (TYLENOL) 500 MG tablet Take 1,000 mg by mouth every 6 (six) hours as needed for headache.      . benzocaine (ORAJEL) 10 % mucosal gel Use as directed 1 application in the mouth or throat as needed for mouth pain.      Marland Kitchen. omeprazole (PRILOSEC) 20 MG capsule Take 20 mg by mouth daily as needed.      . Prenatal Vit-Fe Fumarate-FA (PRENATAL MULTIVITAMIN) TABS tablet Take 1 tablet by mouth daily at 12 noon.      . promethazine (PHENERGAN) 25 MG tablet Take 25 mg by mouth every 6 (six) hours as needed for nausea or vomiting.       No current facility-administered medications for this visit.  Allergies as of 07/20/2014  . (No Known Allergies)    Vitals: BP 105/73  Pulse 79  Ht 5\' 9"  (1.753 m)  Wt 306 lb (138.801 kg)  BMI 45.17 kg/m2  LMP 01/24/2014 Last Weight:  Wt Readings from Last 1 Encounters:  07/20/14 306 lb (138.801 kg)   Last Height:   Ht Readings from Last 1 Encounters:  07/20/14 5\' 9"  (1.753 m)     Physical exam: Exam: Gen: NAD, conversant Eyes: anicteric sclerae, moist conjunctivae HENT: Atraumatic, oropharynx clear Neck: Trachea midline; supple,  Lungs: CTA, no wheezing, rales, rhonic                          CV: RRR, no MRG Abdomen: Soft, non-tender;  Extremities: No peripheral edema  Skin: Normal temperature, no rash,  Psych: Appropriate affect, pleasant  Neuro: MS: AA&Ox3, appropriately interactive, normal  affect   Speech: fluent w/o paraphasic error  Memory: good recent and remote recall  CN: PERRL, VFF to FC bilat, unable to fully visualize fundus due to pupil size, EOMI no nystagmus, no ptosis, sensation intact to LT V1-V3 bilat, face symmetric, no weakness, hearing grossly intact, palate elevates symmetrically, shoulder shrug 5/5 bilat,  tongue protrudes midline, no fasiculations noted.  Motor: normal bulk and tone Strength: 5/5  In all extremities  Coord: rapid alternating and point-to-point (FNF, HTS) movements intact.  Reflexes: symmetrical, bilat downgoing toes  Sens: LT intact in all extremities  Gait: posture, stance, stride and arm-swing normal. Tandem gait intact. Able to walk on heels and toes. Romberg absent.   Assessment:  After physical and neurologic examination, review of laboratory studies, imaging, neurophysiology testing and pre-existing records, assessment will be reviewed on the problem list.  Plan:  Treatment plan and additional workup will be reviewed under Problem List.  27y/o woman, currently [redacted] weeks pregnant, presenting for initial evaluation of headaches associated with pregnancy. She has a history of migraines and had similar headaches during her prior pregnancy. She is currently taking vicodin as needed which gives minimal relief. Physical exam is overall unremarkable making a central process less likely. Will start magnesium 400mg  daily, counseled patient to follow up with her Ob-Gyn prior to starting medication. Follow up as needed.    Elspeth Cho, DO  [facility] [address] [place], [place] [address]  Phone [phone] Fax [phone]

## 2014-07-20 NOTE — Patient Instructions (Signed)
Overall you are doing fairly well but I do want to suggest a few things today:   Remember to drink plenty of fluid, eat healthy meals and do not skip any meals. Try to eat protein with a every meal and eat a healthy snack such as fruit or nuts in between meals. Try to keep a regular sleep-wake schedule and try to exercise daily, particularly in the form of walking, 20-30 minutes a day, if you can.   As far as your medications are concerned, I would like to suggest the following" 1)Please start Magnesium 400mg  daily. Please check with your Ob-Gyn prior to starting this medication 2)Continue to use Vicodin on a limited basis for severe headaches  Follow up as needed. Please call us with any interim questions, concerns, problems, updates or refill requests.   Please also call us for any test results so we can go over those with you on the phone.  My clinical assistant and will answer any of your questions and relay your messages to me and also relay most of my messages to you.   Our phone number is [phone]. We also have an after hours call service for urgent matters and there is a physician on-call for urgent questions. For any emergencies you know to call 911 or go to the nearest emergency room

## 2014-07-20 NOTE — Telephone Encounter (Signed)
Note opened in erro

## 2014-10-03 ENCOUNTER — Ambulatory Visit (HOSPITAL_COMMUNITY)
Admission: RE | Admit: 2014-10-03 | Discharge: 2014-10-03 | Disposition: A | Discharge status: Home / Self Care | Payer: Medicaid Other | Source: Ambulatory Visit | Attending: Obstetrics and Gynecology | Admitting: Obstetrics and Gynecology

## 2014-10-03 ENCOUNTER — Other Ambulatory Visit (HOSPITAL_COMMUNITY): Payer: Self-pay | Admitting: Obstetrics and Gynecology

## 2014-10-03 DIAGNOSIS — O289 Unspecified abnormal findings on antenatal screening of mother: Secondary | ICD-10-CM | POA: Diagnosis not present

## 2014-10-03 DIAGNOSIS — Z3A00 Weeks of gestation of pregnancy not specified: Secondary | ICD-10-CM | POA: Diagnosis not present

## 2014-10-03 DIAGNOSIS — O24410 Gestational diabetes mellitus in pregnancy, diet controlled: Secondary | ICD-10-CM | POA: Diagnosis present

## 2014-10-03 DIAGNOSIS — O288 Other abnormal findings on antenatal screening of mother: Secondary | ICD-10-CM

## 2014-10-04 ENCOUNTER — Inpatient Hospital Stay (HOSPITAL_COMMUNITY)
Admission: AD | Admit: 2014-10-04 | Discharge: 2014-10-04 | Disposition: A | Discharge status: Home / Self Care | Payer: Medicaid Other | Source: Ambulatory Visit | Attending: Obstetrics and Gynecology | Admitting: Obstetrics and Gynecology

## 2014-10-04 ENCOUNTER — Encounter (HOSPITAL_COMMUNITY): Payer: Self-pay | Admitting: *Deleted

## 2014-10-04 DIAGNOSIS — O368130 Decreased fetal movements, third trimester, not applicable or unspecified: Secondary | ICD-10-CM | POA: Diagnosis not present

## 2014-10-04 DIAGNOSIS — Z3A32 32 weeks gestation of pregnancy: Secondary | ICD-10-CM | POA: Insufficient documentation

## 2014-10-04 DIAGNOSIS — Z36 Encounter for antenatal screening of mother: Secondary | ICD-10-CM

## 2014-10-04 DIAGNOSIS — Z3689 Encounter for other specified antenatal screening: Secondary | ICD-10-CM

## 2014-10-04 NOTE — MAU Provider Note (Signed)
  NST after 6/8 BPP in office.  140-145 R NST

## 2014-10-04 NOTE — MAU Provider Note (Signed)
S:  Ms. Audrey [name] is a 28 y.o. female who presents for an NST. The patient had a BPP in the office today with a 6/8 result. She is feeling the baby move normally at this time. She denies bleeding or leaking of fluid. The patient has a history of a term fetal demise.    O:  GENERAL: Well-developed, well-nourished female in no acute distress.  HEENT: Normocephalic, atraumatic.   LUNGS: Effort normal HEART: Regular rate  SKIN: Warm, dry and without erythema PSYCH: Normal mood and affect  Fetal Tracing: Baseline: 135 bpm  Variability: Moderate  Accelerations: 15x15 Decelerations: None Toco: None    Filed Vitals:   10/04/14 1705  BP: 123/75  Pulse: 94  Temp:   Resp: 18   No results found for this or any previous visit (from the past 72 hour(s)).   MDM NST  Discussed fetal tracing with Dr. Ellyn HackBovard   A:  1. NST (non-stress test) reactive    P:  Discharge patient home in stable condition Kick counts Return to MAU with any concerns regarding fetal movement.  Follow up with Dr. Ellyn HackBovard as scheduled  Iona HansenJennifer Irene Eann Cleland, NP 10/04/2014 7:25 PM

## 2014-10-04 NOTE — MAU Note (Addendum)
BPP 6/8 today. States had U/S yesterday also, [facility]. Sent by Dr. Ellyn HackBovard for NST. States she has felt decreased FM since yesterday. Hx 37 wk IUFD. States she woke up with a HA and is scared to take tylenol so she has not taken anything.

## 2014-10-12 ENCOUNTER — Inpatient Hospital Stay (HOSPITAL_COMMUNITY)
Admission: AD | Admit: 2014-10-12 | Discharge: 2014-10-12 | Disposition: A | Discharge status: Home / Self Care | Payer: Medicaid Other | Source: Ambulatory Visit | Attending: Obstetrics and Gynecology | Admitting: Obstetrics and Gynecology

## 2014-10-12 ENCOUNTER — Encounter (HOSPITAL_COMMUNITY): Payer: Self-pay | Admitting: General Practice

## 2014-10-12 DIAGNOSIS — Z3A33 33 weeks gestation of pregnancy: Secondary | ICD-10-CM | POA: Insufficient documentation

## 2014-10-12 DIAGNOSIS — O4703 False labor before 37 completed weeks of gestation, third trimester: Secondary | ICD-10-CM

## 2014-10-12 DIAGNOSIS — R109 Unspecified abdominal pain: Secondary | ICD-10-CM | POA: Diagnosis present

## 2014-10-12 LAB — URINALYSIS, ROUTINE W REFLEX MICROSCOPIC
Bilirubin Urine: NEGATIVE
Glucose, UA: NEGATIVE mg/dL
KETONES UR: 15 mg/dL — AB
LEUKOCYTES UA: NEGATIVE
NITRITE: NEGATIVE
PROTEIN: NEGATIVE mg/dL
Specific Gravity, Urine: 1.02 (ref 1.005–1.030)
UROBILINOGEN UA: 1 mg/dL (ref 0.0–1.0)
pH: 6 (ref 5.0–8.0)

## 2014-10-12 LAB — URINE MICROSCOPIC-ADD ON

## 2014-10-12 NOTE — MAU Note (Signed)
Pt presents to mau with c/o upper abdominal tightness and pain. Pt states she feels this off and on throughout the day for about 2 days. Pt states good fetal movement. Denies leaking fluid.

## 2014-10-12 NOTE — MAU Provider Note (Signed)
History     CSN: [id number]  Arrival date and time: 10/12/14 1033  Seen by provider at 1300    Chief Complaint  Patient presents with  . Abdominal Pain   HPI Audrey [name] 28 y.o. 7579w6d  Comes to MAU with cramping and low back pain since yesterday.  No vaginal bleeding.  No leaking.  Feeling fetal movement regularly.  See in the office last on Tuesday and has an appointment on Friday.  History of GDM and has had a previous stillbirth.  OB History   Grav Para Term Preterm Abortions TAB SAB Ect Mult Living   3 2 2  0 0 0 0 0 0 1      Past Medical History  Diagnosis Date  . Preeclampsia   . Diabetes mellitus without complication   . Pregnancy induced hypertension   . Gestational diabetes   . Fetal demise 05/19/2013  . S/P cesarean section 05/20/2013    Past Surgical History  Procedure Laterality Date  . Cesarean section    . Toenail excision    . Wisdom tooth extraction    . Cesarean section N/A 05/19/2013    Procedure: CESAREAN SECTION;  Surgeon: Sherron MondayJody Bovard, MD;  Location: [facility];  Service: Obstetrics;  Laterality: N/A;    Family History  Problem Relation Age of Onset  . Asthma Other   . Hyperlipidemia Other   . Obesity Other     History  Substance Use Topics  . Smoking status: Never Smoker   . Smokeless tobacco: Never Used  . Alcohol Use: No    Allergies: No Known Allergies  Prescriptions prior to admission  Medication Sig Dispense Refill  . acetaminophen (TYLENOL) 500 MG tablet Take 1,000 mg by mouth every 6 (six) hours as needed for headache.      . glyBURIDE (DIABETA) 1.25 MG tablet Take 1.25 mg by mouth at bedtime.      Marland Kitchen. omeprazole (PRILOSEC) 20 MG capsule Take 20 mg by mouth daily as needed (heartburn).       . Prenatal Vit-Fe Fumarate-FA (PRENATAL MULTIVITAMIN) TABS tablet Take 1 tablet by mouth daily at 12 noon.        Review of Systems  Constitutional: Negative for fever.  Gastrointestinal: Positive for abdominal pain. Negative for nausea and  vomiting.  Genitourinary:       No vaginal discharge. No vaginal bleeding. No dysuria.   Physical Exam   Blood pressure 118/67, pulse 82, resp. rate 18, height 5' 7.5" (1.715 m), weight 310 lb 9.6 oz (140.887 kg), last menstrual period 01/24/2014, unknown if currently breastfeeding.  Physical Exam  Nursing note and vitals reviewed. Constitutional: She is oriented to person, place, and time. She appears well-developed and well-nourished. No distress.  Obese  HENT:  Head: Normocephalic.  Eyes: EOM are normal.  Neck: Neck supple.  GI: Soft. There is no tenderness. There is no rebound and no guarding.  No contractions seen on monitor strip.  No contractions palpated by the examiner.  Client verbally reports no contractions at present. FHT baseline 135 with 15x15 accels noted - reactive strip.  Genitourinary:  Bimanual exam - cervix tightly closed and thick Presenting part is ballotable No bleeding noted.  Musculoskeletal: Normal range of motion.  Neurological: She is alert and oriented to person, place, and time.  Skin: Skin is warm and dry.  Psychiatric: She has a normal mood and affect.    MAU Course  Procedures Results for orders placed during the hospital encounter of 10/12/14 (  from the past 24 hour(s))  URINALYSIS, ROUTINE W REFLEX MICROSCOPIC     Status: Abnormal   Collection Time    10/12/14 10:50 AM      Result Value Ref Range   Color, Urine YELLOW  YELLOW   APPearance CLEAR  CLEAR   Specific Gravity, Urine 1.020  1.005 - 1.030   pH 6.0  5.0 - 8.0   Glucose, UA NEGATIVE  NEGATIVE mg/dL   Hgb urine dipstick TRACE (*) NEGATIVE   Bilirubin Urine NEGATIVE  NEGATIVE   Ketones, ur 15 (*) NEGATIVE mg/dL   Protein, ur NEGATIVE  NEGATIVE mg/dL   Urobilinogen, UA 1.0  0.0 - 1.0 mg/dL   Nitrite NEGATIVE  NEGATIVE   Leukocytes, UA NEGATIVE  NEGATIVE  URINE MICROSCOPIC-ADD ON     Status: None   Collection Time    10/12/14 10:50 AM      Result Value Ref Range   Squamous  Epithelial / LPF RARE  RARE   WBC, UA 0-2  <3 WBC/hpf   RBC / HPF 0-2  <3 RBC/hpf   Bacteria, UA RARE  RARE    MDM 1310  Client states she has not felt contractions since arrival to MAU.  Currently very comfortable. 1315  Consult with Dr. Senaida Oresichardson - to go home today.  Assessment and Plan  Threatened preterm labor  Plan Discharge Fetal kick counts - info given Keep the appointment in the office tomorrow. Return if your symptoms worsen, or if you have bleeding or leaking.  BURLESON,TERRI 10/12/2014, 1:14 PM   NST reviewed and category 1

## 2014-10-12 NOTE — Discharge Instructions (Signed)
Drink at least 8 8-oz glasses of water every day. Take Tylenol 325 mg 2 tablets by mouth every 4 hours if needed for pain. Keep your appointment in the office tomorrow. Return if you have worsening contractions.

## 2014-10-30 ENCOUNTER — Encounter (HOSPITAL_COMMUNITY): Payer: Self-pay | Admitting: General Practice

## 2014-11-15 ENCOUNTER — Encounter (HOSPITAL_COMMUNITY): Payer: Self-pay

## 2014-11-15 ENCOUNTER — Inpatient Hospital Stay (HOSPITAL_COMMUNITY): Payer: Medicaid Other

## 2014-11-15 ENCOUNTER — Inpatient Hospital Stay (HOSPITAL_COMMUNITY)
Admission: AD | Admit: 2014-11-15 | Discharge: 2014-11-19 | DRG: 765 | Disposition: A | Discharge status: Home / Self Care | Payer: Medicaid Other | Source: Ambulatory Visit | Attending: Obstetrics and Gynecology | Admitting: Obstetrics and Gynecology

## 2014-11-15 ENCOUNTER — Encounter (HOSPITAL_COMMUNITY)
Admission: RE | Admit: 2014-11-15 | Discharge: 2014-11-15 | Disposition: A | Discharge status: Home / Self Care | Payer: Medicaid Other | Source: Ambulatory Visit | Attending: Obstetrics and Gynecology | Admitting: Obstetrics and Gynecology

## 2014-11-15 DIAGNOSIS — O34219 Maternal care for unspecified type scar from previous cesarean delivery: Secondary | ICD-10-CM

## 2014-11-15 DIAGNOSIS — O99613 Diseases of the digestive system complicating pregnancy, third trimester: Secondary | ICD-10-CM | POA: Diagnosis present

## 2014-11-15 DIAGNOSIS — IMO0002 Reserved for concepts with insufficient information to code with codable children: Secondary | ICD-10-CM | POA: Insufficient documentation

## 2014-11-15 DIAGNOSIS — O288 Other abnormal findings on antenatal screening of mother: Secondary | ICD-10-CM | POA: Diagnosis present

## 2014-11-15 DIAGNOSIS — O99214 Obesity complicating childbirth: Secondary | ICD-10-CM | POA: Diagnosis present

## 2014-11-15 DIAGNOSIS — O3421 Maternal care for scar from previous cesarean delivery: Secondary | ICD-10-CM | POA: Diagnosis present

## 2014-11-15 DIAGNOSIS — K219 Gastro-esophageal reflux disease without esophagitis: Secondary | ICD-10-CM | POA: Diagnosis present

## 2014-11-15 DIAGNOSIS — O24420 Gestational diabetes mellitus in childbirth, diet controlled: Principal | ICD-10-CM | POA: Diagnosis present

## 2014-11-15 DIAGNOSIS — O368130 Decreased fetal movements, third trimester, not applicable or unspecified: Secondary | ICD-10-CM | POA: Diagnosis present

## 2014-11-15 DIAGNOSIS — O24410 Gestational diabetes mellitus in pregnancy, diet controlled: Secondary | ICD-10-CM | POA: Insufficient documentation

## 2014-11-15 DIAGNOSIS — Z3A38 38 weeks gestation of pregnancy: Secondary | ICD-10-CM | POA: Diagnosis present

## 2014-11-15 DIAGNOSIS — J029 Acute pharyngitis, unspecified: Secondary | ICD-10-CM | POA: Diagnosis not present

## 2014-11-15 DIAGNOSIS — Z6841 Body Mass Index (BMI) 40.0 and over, adult: Secondary | ICD-10-CM

## 2014-11-15 DIAGNOSIS — R05 Cough: Secondary | ICD-10-CM | POA: Diagnosis not present

## 2014-11-15 DIAGNOSIS — Z302 Encounter for sterilization: Secondary | ICD-10-CM

## 2014-11-15 DIAGNOSIS — Z98891 History of uterine scar from previous surgery: Secondary | ICD-10-CM

## 2014-11-15 DIAGNOSIS — O09293 Supervision of pregnancy with other poor reproductive or obstetric history, third trimester: Secondary | ICD-10-CM

## 2014-11-15 DIAGNOSIS — Z141 Cystic fibrosis carrier: Secondary | ICD-10-CM

## 2014-11-15 DIAGNOSIS — R0981 Nasal congestion: Secondary | ICD-10-CM | POA: Diagnosis not present

## 2014-11-15 DIAGNOSIS — O99824 Streptococcus B carrier state complicating childbirth: Secondary | ICD-10-CM | POA: Diagnosis present

## 2014-11-15 HISTORY — DX: Headache, unspecified: R51.9

## 2014-11-15 HISTORY — DX: Gastro-esophageal reflux disease without esophagitis: K21.9

## 2014-11-15 HISTORY — DX: Headache: R51

## 2014-11-15 LAB — CBC
HEMATOCRIT: 32.6 % — AB (ref 36.0–46.0)
HEMOGLOBIN: 10.5 g/dL — AB (ref 12.0–15.0)
MCH: 27.9 pg (ref 26.0–34.0)
MCHC: 32.2 g/dL (ref 30.0–36.0)
MCV: 86.7 fL (ref 78.0–100.0)
Platelets: 300 10*3/uL (ref 150–400)
RBC: 3.76 MIL/uL — AB (ref 3.87–5.11)
RDW: 15.8 % — ABNORMAL HIGH (ref 11.5–15.5)
WBC: 10.8 10*3/uL — ABNORMAL HIGH (ref 4.0–10.5)

## 2014-11-15 LAB — BASIC METABOLIC PANEL
Anion gap: 11 (ref 5–15)
BUN: 9 mg/dL (ref 6–23)
CALCIUM: 8.4 mg/dL (ref 8.4–10.5)
CO2: 21 mEq/L (ref 19–32)
Chloride: 102 mEq/L (ref 96–112)
Creatinine, Ser: 0.68 mg/dL (ref 0.50–1.10)
GFR calc Af Amer: >90 mL/min (ref >90)
GFR calc non Af Amer: >90 mL/min (ref >90)
Glucose, Bld: 127 mg/dL — ABNORMAL HIGH (ref 70–99)
Potassium: 3.6 mEq/L — ABNORMAL LOW (ref 3.7–5.3)
Sodium: 134 mEq/L — ABNORMAL LOW (ref 137–147)

## 2014-11-15 LAB — URINALYSIS, ROUTINE W REFLEX MICROSCOPIC
Bilirubin Urine: NEGATIVE
Glucose, UA: NEGATIVE mg/dL
KETONES UR: 15 mg/dL — AB
LEUKOCYTES UA: NEGATIVE
NITRITE: NEGATIVE
PH: 6.5 (ref 5.0–8.0)
PROTEIN: NEGATIVE mg/dL
Specific Gravity, Urine: 1.025 (ref 1.005–1.030)
Urobilinogen, UA: 1 mg/dL (ref 0.0–1.0)

## 2014-11-15 LAB — TYPE AND SCREEN
ABO/RH(D): AB POS
ANTIBODY SCREEN: NEGATIVE

## 2014-11-15 LAB — URINE MICROSCOPIC-ADD ON

## 2014-11-15 LAB — RPR

## 2014-11-15 LAB — GLUCOSE, CAPILLARY: Glucose-Capillary: 94 mg/dL (ref 70–99)

## 2014-11-15 MED ORDER — AZITHROMYCIN 500 MG PO TABS
500.0000 mg | ORAL_TABLET | Freq: Once | ORAL | Status: AC
  Administered 2014-11-16: 500 mg via ORAL
  Filled 2014-11-15: qty 1

## 2014-11-15 MED ORDER — ZOLPIDEM TARTRATE 5 MG PO TABS: 5.0000 mg | ORAL_TABLET | Freq: Every evening | ORAL | Status: DC | PRN

## 2014-11-15 MED ORDER — ACETAMINOPHEN 325 MG PO TABS: 650.0000 mg | ORAL_TABLET | ORAL | Status: DC | PRN

## 2014-11-15 MED ORDER — GLYBURIDE 1.25 MG PO TABS
1.2500 mg | ORAL_TABLET | ORAL | Status: AC
  Administered 2014-11-16: 1.25 mg via ORAL
  Filled 2014-11-15: qty 1

## 2014-11-15 MED ORDER — GLYBURIDE 2.5 MG PO TABS
1.2500 mg | ORAL_TABLET | Freq: Two times a day (BID) | ORAL | Status: DC
  Administered 2014-11-16: 1.25 mg via ORAL
  Filled 2014-11-15 (×5): qty 0.5

## 2014-11-15 MED ORDER — CALCIUM CARBONATE ANTACID 500 MG PO CHEW: 2.0000 | CHEWABLE_TABLET | ORAL | Status: DC | PRN

## 2014-11-15 MED ORDER — AZITHROMYCIN 250 MG PO TABS: 250.0000 mg | ORAL_TABLET | Freq: Every day | ORAL | Status: DC

## 2014-11-15 MED ORDER — DOCUSATE SODIUM 100 MG PO CAPS: 100.0000 mg | ORAL_CAPSULE | Freq: Every day | ORAL | Status: DC

## 2014-11-15 MED ORDER — PANTOPRAZOLE SODIUM 40 MG PO TBEC
40.0000 mg | DELAYED_RELEASE_TABLET | Freq: Every day | ORAL | Status: DC
Start: 2014-11-16 — End: 2014-11-17

## 2014-11-15 MED ORDER — PANTOPRAZOLE SODIUM 40 MG PO TBEC
40.0000 mg | DELAYED_RELEASE_TABLET | Freq: Every day | ORAL | Status: AC
  Administered 2014-11-16: 40 mg via ORAL
  Filled 2014-11-15: qty 1

## 2014-11-15 MED ORDER — PRENATAL MULTIVITAMIN CH: 1.0000 | ORAL_TABLET | Freq: Every day | ORAL | Status: DC

## 2014-11-15 MED ORDER — AZITHROMYCIN 500 MG PO TABS
500.0000 mg | ORAL_TABLET | Freq: Every day | ORAL | Status: DC
  Filled 2014-11-15: qty 1

## 2014-11-15 MED ORDER — AZITHROMYCIN 250 MG PO TABS
250.0000 mg | ORAL_TABLET | Freq: Every day | ORAL | Status: DC
  Administered 2014-11-16: 250 mg via ORAL
  Filled 2014-11-15 (×3): qty 1

## 2014-11-15 NOTE — MAU Provider Note (Signed)
History     CSN: [id number]  Arrival date and time: 11/15/14 [id number]   First Provider Initiated Contact with Patient 11/15/14 1940      Chief Complaint  Patient presents with  . Hypertension   HPI Audrey [name] 28 y.o. G3P2001 @ [id number] presents to MAU for elevated blood pressure.  She went to pre-op today to prepare for her c-section that is scheduled for 11/20.  Her blood pressure in the left arm has been consistently elevated.  Dr. Senaida Oresichardson advised for her to come in to MAU.  Baby is moving well. She has a headache and questions if she has a sinus infection because her throat is scratchy, nose is congested and body aches.  She was prescribed z-pak for this but has not yet started.   She denies vaginal bleeding, LOF, abdominal pain, dysuria, fever, weakness, chills, sweats.   OB History    Gravida Para Term Preterm AB TAB SAB Ectopic Multiple Living   3 2 2  0 0 0 0 0 0 1      Past Medical History  Diagnosis Date  . Preeclampsia   . Diabetes mellitus without complication   . Pregnancy induced hypertension   . Gestational diabetes   . Fetal demise 05/19/2013  . S/P cesarean section 05/20/2013  . GERD (gastroesophageal reflux disease)     with pregnancy  . Headache     migraines  . Hemorrhage after delivery of fetus 08/12/2006    Past Surgical History  Procedure Laterality Date  . Cesarean section    . Toenail excision    . Wisdom tooth extraction    . Cesarean section N/A 05/19/2013    Procedure: CESAREAN SECTION;  Surgeon: Sherron MondayJody Bovard, MD;  Location: [facility];  Service: Obstetrics;  Laterality: N/A;    Family History  Problem Relation Age of Onset  . Asthma Other   . Hyperlipidemia Other   . Obesity Other     History  Substance Use Topics  . Smoking status: Never Smoker   . Smokeless tobacco: Never Used  . Alcohol Use: No    Allergies: No Known Allergies  Prescriptions prior to admission  Medication Sig Dispense Refill Last Dose  . acetaminophen (TYLENOL)  500 MG tablet Take 1,000 mg by mouth every 6 (six) hours as needed for headache.   11/14/2014 at Unknown time  . glyBURIDE (DIABETA) 1.25 MG tablet Take 1.25 mg by mouth 2 (two) times daily with a meal.    11/15/2014 at Unknown time  . omeprazole (PRILOSEC) 20 MG capsule Take 20 mg by mouth daily as needed (heartburn).    11/15/2014 at Unknown time  . Prenatal Vit-Fe Fumarate-FA (PRENATAL MULTIVITAMIN) TABS tablet Take 1 tablet by mouth daily at 12 noon.   11/15/2014 at Unknown time  . promethazine (PHENERGAN) 25 MG tablet Take 25 mg by mouth every 6 (six) hours as needed for nausea or vomiting.   11/14/2014 at Unknown time    ROS Pertinent ROS in HPI Physical Exam   Blood pressure 141/95, pulse 88, temperature 98.8 F (37.1 C), temperature source Oral, resp. rate 18, height 5\' 8"  (1.727 m), weight 322 lb (146.058 kg), last menstrual period 01/24/2014, SpO2 98 %, unknown if currently breastfeeding.  Physical Exam  Constitutional: She is oriented to person, place, and time. She appears well-developed and well-nourished.  HENT:  Head: Normocephalic and atraumatic.  Eyes: EOM are normal.  Neck: Normal range of motion.  Cardiovascular: Normal rate and regular rhythm.  Exam reveals  no gallop and no friction rub.   No murmur heard. Respiratory: Effort normal and breath sounds normal. No respiratory distress.  GI: Soft. She exhibits no distension. There is no tenderness. There is no rebound and no guarding.  Musculoskeletal: Normal range of motion.  Neurological: She is alert and oriented to person, place, and time.  Skin: Skin is warm and dry.  Psychiatric: She has a normal mood and affect.   Results for orders placed or performed during the hospital encounter of 11/15/14 (from the past 72 hour(s))  Urinalysis, Routine w reflex microscopic     Status: Abnormal   Collection Time: 11/15/14  7:18 PM  Result Value Ref Range   Color, Urine YELLOW YELLOW   APPearance CLEAR CLEAR   Specific  Gravity, Urine 1.025 1.005 - 1.030   pH 6.5 5.0 - 8.0   Glucose, UA NEGATIVE NEGATIVE mg/dL   Hgb urine dipstick SMALL (A) NEGATIVE   Bilirubin Urine NEGATIVE NEGATIVE   Ketones, ur 15 (A) NEGATIVE mg/dL   Protein, ur NEGATIVE NEGATIVE mg/dL   Urobilinogen, UA 1.0 0.0 - 1.0 mg/dL   Nitrite NEGATIVE NEGATIVE   Leukocytes, UA NEGATIVE NEGATIVE  Urine microscopic-add on     Status: Abnormal   Collection Time: 11/15/14  7:18 PM  Result Value Ref Range   Squamous Epithelial / LPF FEW (A) RARE   RBC / HPF 3-6 <3 RBC/hpf   Bacteria, UA MANY (A) RARE    MAU Course  Procedures  MDM Discussed with Dr. Senaida Oresichardson whom has reviewed pt's monitoring strip and poor variability noted.  Per her advice, BPP ordered and pt to check blood sugar with her own machine.  Pt checked blood sugar and it was found to be 85.  She complains of weakness and states not sure if weakness related to sugar or not.  MD agrees to have pt eat diabetic diet at this time while awaiting BPP. Preliminary BPP report is 8/8.  Pt to be placed on monitor again for further monitoring.  Dr. Senaida Oresichardson to be notified of condition and will determine if admission is necessary.   Pt care turned over to Vonzella NippleJulie Wenzel, PA-C at 9:30pm.     Bertram Denvereague Clark, Karen E 11/15/2014, 7:42 PM   Discussed patient BPP 8/8 and NST with Dr. Senaida Oresichardson. She will admit for observation overnight.  Assessment and Plan  A: elevated blood pressure in pregnancy Non-reactive NSt  P:   Admit to antenatal for prolonged monitoring  Marny LowensteinJulie N Wenzel, PA-C 11/15/2014 10:09 PM

## 2014-11-15 NOTE — Patient Instructions (Signed)
Your procedure is scheduled on:  Friday, Nov 17, 2014  [facility] through the [facility] at: 6:00 a.m.  Pick up the phone at the desk and dial [phone].  Call this number if you have problems the morning of surgery: [phone].  Remember: Do NOT eat food: AFTER MIDNIGHT THURSDAY Do NOT drink clear liquids after: AFTER MIDNIGHT THURSDAY Take these medicines the morning of surgery with a SIP OF WATER:  Omeprazole  Do NOT wear jewelry (body piercing), metal hair clips/bobby pins, or nail polish. Do NOT wear lotions, powders, or perfumes.  You may wear deoderant. Do NOT shave for 48 hours prior to surgery. Do NOT bring valuables to the hospital. Leave suitcase in car.  After surgery it may be brought to your room.  For patients admitted to the hospital, checkout time is 11:00 AM the day of discharge.

## 2014-11-15 NOTE — MAU Note (Signed)
Urine in lab 

## 2014-11-15 NOTE — MAU Note (Signed)
See earlier today for preop and her b/p was elevated and was told if her b/p was still elevated tonight to come in for an NST

## 2014-11-15 NOTE — H&P (Signed)
Audrey [name] is a 28 y.o. female G3P2001 at 7638 5/7 weeks (EDD 11/24/14 by 8 week US) presenting for c/o decreased FM and increased BP.  Pt had pre-operative evaluation today and noted to have BP 140/90, labs WNL and no proteinuria.  She was not symptomatic.  She then went home and noted decreased FM, and was advised to come to MAU for testing.  NST was NR but reassuring and BPP 8/8, but NST is still not reactive.  The patient has a poor obstetrical history with a term fetal demise her last pregnancy 2 days prior to her scheduled c-section.  She has GDM and has maintained good control on glyburide 2.5 mg po BID.  She has noted her BS today was a bit elevated postprandially at 168, but other values WNL.  She also c/o sinus symptoms.  She has had C/S x 2 and is for a repeat c-section 11/17/14.   She is acarrier for CF, but the FOB tested negative.  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    Maternal Medical History:  Contractions: Frequency: rare.   Perceived severity is mild.    Fetal activity: Perceived fetal activity is decreased.    Prenatal Complications - Diabetes: gestational.    OB History    Gravida Para Term Preterm AB TAB SAB Ectopic Multiple Living   3 2 2  0 0 0 0 0 0 1    2007 C/S 7#5oz PIH 2014 LTCS Stillborn 8#9oz GDM  Past Medical History  Diagnosis Date  . Preeclampsia   . Diabetes mellitus without complication   . Pregnancy induced hypertension   . Gestational diabetes   . Fetal demise 05/19/2013  . S/P cesarean section 05/20/2013  . GERD (gastroesophageal reflux disease)     with pregnancy  . Headache     migraines  . Hemorrhage after delivery of fetus 08/12/2006   Past Surgical History  Procedure Laterality Date  . Cesarean section    . Toenail excision    . Wisdom tooth extraction    . Cesarean section N/A 05/19/2013    Procedure: CESAREAN SECTION;  Surgeon: Sherron MondayJody Bovard, MD;  Location: [facility];  Service: Obstetrics;  Laterality: N/A;   Family History: family history  includes Asthma in her other; Hyperlipidemia in her other; Obesity in her other. Social History:  reports that she has never smoked. She has never used smokeless tobacco. She reports that she does not drink alcohol or use illicit drugs.   Prenatal Transfer Tool  Maternal Diabetes: Yes:  Diabetes Type:  Insulin/Medication controlled Genetic Screening: Normal Maternal Ultrasounds/Referrals: Normal Fetal Ultrasounds or other Referrals:  None Maternal Substance Abuse:  No Significant Maternal Medications:  Meds include: Other: Glyburide Significant Maternal Lab Results:  Lab values include: Group B Strep positive Other Comments:  None  Review of Systems  Neurological: Negative for headaches.      Blood pressure 141/95, pulse 88, temperature 98.8 F (37.1 C), temperature source Oral, resp. rate 18, height 5\' 8"  (1.727 m), weight 146.058 kg (322 lb), last menstrual period 01/24/2014, SpO2 98 %, unknown if currently breastfeeding. Maternal Exam:  Uterine Assessment: Contraction frequency is rare.   Abdomen: Patient reports no abdominal tenderness. Surgical scars: low transverse.   Fetal presentation: vertex  Introitus: Normal vulva. Normal vagina.    Physical Exam  Constitutional: She appears well-developed.  Cardiovascular: Normal rate and regular rhythm.   Respiratory: Effort normal.  GI: Soft.  Genitourinary: Vagina normal.  Gravid, obese  Neurological: She is  alert.  Psychiatric: She has a normal mood and affect.    Prenatal labs: ABO, Rh: --/--/AB POS (11/18 1039) Antibody: NEG (11/18 1039) Rubella:  Immune RPR: NON REAC (11/18 1038)  HBsAg: Negative (05/05 0000)  HIV: Non-reactive (05/05 0000)  GBS:   Positive One hour GTT 165 First trimester screen and AFP negative   Assessment/Plan: Pt presents with decreased FM and NR NST.  BPP 8/8, but with h/o term fetal demise, I feel she needs extended monitoring given persistent NR NST.  Pt had a BS of 80's in MAU and felt  bad so was allowed to eat.  She will take her glyburide this PM as well.  She had slight ketones on UA.  BP was 140/90-120/70's and no other signs of preeclampsia.  Will follow closely and if fetal monitoring becomes non-reassuring proceed with c-section.  If becomes reactive, could consider d/c home for c-section 11/17/14.  Oliver PilaICHARDSON,Loucille Takach W 11/15/2014, 10:12 PM

## 2014-11-16 ENCOUNTER — Inpatient Hospital Stay (HOSPITAL_COMMUNITY): Payer: Medicaid Other

## 2014-11-16 ENCOUNTER — Other Ambulatory Visit (HOSPITAL_COMMUNITY): Payer: Medicaid Other

## 2014-11-16 DIAGNOSIS — O99613 Diseases of the digestive system complicating pregnancy, third trimester: Secondary | ICD-10-CM | POA: Diagnosis present

## 2014-11-16 DIAGNOSIS — O368130 Decreased fetal movements, third trimester, not applicable or unspecified: Secondary | ICD-10-CM | POA: Diagnosis not present

## 2014-11-16 DIAGNOSIS — Z141 Cystic fibrosis carrier: Secondary | ICD-10-CM | POA: Diagnosis not present

## 2014-11-16 DIAGNOSIS — Z6841 Body Mass Index (BMI) 40.0 and over, adult: Secondary | ICD-10-CM | POA: Diagnosis not present

## 2014-11-16 DIAGNOSIS — O99214 Obesity complicating childbirth: Secondary | ICD-10-CM | POA: Diagnosis present

## 2014-11-16 DIAGNOSIS — O09293 Supervision of pregnancy with other poor reproductive or obstetric history, third trimester: Secondary | ICD-10-CM | POA: Diagnosis not present

## 2014-11-16 DIAGNOSIS — IMO0002 Reserved for concepts with insufficient information to code with codable children: Secondary | ICD-10-CM | POA: Insufficient documentation

## 2014-11-16 DIAGNOSIS — O24410 Gestational diabetes mellitus in pregnancy, diet controlled: Secondary | ICD-10-CM | POA: Insufficient documentation

## 2014-11-16 DIAGNOSIS — R0981 Nasal congestion: Secondary | ICD-10-CM | POA: Diagnosis not present

## 2014-11-16 DIAGNOSIS — O24420 Gestational diabetes mellitus in childbirth, diet controlled: Secondary | ICD-10-CM | POA: Diagnosis not present

## 2014-11-16 DIAGNOSIS — K219 Gastro-esophageal reflux disease without esophagitis: Secondary | ICD-10-CM | POA: Diagnosis present

## 2014-11-16 DIAGNOSIS — Z302 Encounter for sterilization: Secondary | ICD-10-CM | POA: Diagnosis not present

## 2014-11-16 DIAGNOSIS — Z3A38 38 weeks gestation of pregnancy: Secondary | ICD-10-CM | POA: Diagnosis present

## 2014-11-16 DIAGNOSIS — O99824 Streptococcus B carrier state complicating childbirth: Secondary | ICD-10-CM | POA: Diagnosis present

## 2014-11-16 DIAGNOSIS — J029 Acute pharyngitis, unspecified: Secondary | ICD-10-CM | POA: Diagnosis not present

## 2014-11-16 DIAGNOSIS — O3421 Maternal care for scar from previous cesarean delivery: Secondary | ICD-10-CM | POA: Diagnosis present

## 2014-11-16 DIAGNOSIS — R05 Cough: Secondary | ICD-10-CM | POA: Diagnosis not present

## 2014-11-16 LAB — GLUCOSE, CAPILLARY
GLUCOSE-CAPILLARY: 63 mg/dL — AB (ref 70–99)
GLUCOSE-CAPILLARY: 73 mg/dL (ref 70–99)
GLUCOSE-CAPILLARY: 90 mg/dL (ref 70–99)
Glucose-Capillary: 61 mg/dL — ABNORMAL LOW (ref 70–99)
Glucose-Capillary: 84 mg/dL (ref 70–99)

## 2014-11-16 MED ORDER — LACTATED RINGERS IV SOLN
INTRAVENOUS | Status: DC
  Administered 2014-11-17 (×3): via INTRAVENOUS

## 2014-11-16 MED ORDER — DEXTROSE 5 % IN LACTATED RINGERS IV BOLUS: 500.0000 mL | Freq: Once | INTRAVENOUS | Status: DC

## 2014-11-16 MED ORDER — DEXTROSE 5 % IV SOLN
3.0000 g | INTRAVENOUS | Status: AC
  Administered 2014-11-17: 3 g via INTRAVENOUS
  Filled 2014-11-16 (×2): qty 3000

## 2014-11-16 NOTE — Progress Notes (Signed)
RN at bedside for 20 minutes, fetal movement palpated and audible, EFM adjusting and assessing.

## 2014-11-16 NOTE — Plan of Care (Signed)
Problem: Consults Goal: Orientation to unit: Other (Specify with a note) Outcome: Completed/Met Date Met:  11/16/14

## 2014-11-16 NOTE — Plan of Care (Signed)
Problem: Consults Goal: Prenatal labs/testing reviewed upon admission Outcome: Completed/Met Date Met:  11/16/14

## 2014-11-16 NOTE — Plan of Care (Signed)
Problem: Phase I Progression Outcomes Goal: Obtain and review prenatal records Outcome: Completed/Met Date Met:  11/16/14     

## 2014-11-16 NOTE — Plan of Care (Signed)
Problem: Phase I Progression Outcomes Goal: Tolerating diet Outcome: Completed/Met Date Met:  11/16/14

## 2014-11-16 NOTE — Plan of Care (Signed)
Problem: Phase I Progression Outcomes Goal: Assess per MD/Nurse,Routine-VS,FHR,UC,Head to Toe assess Outcome: Completed/Met Date Met:  11/16/14

## 2014-11-16 NOTE — Plan of Care (Signed)
Problem: Consults Goal: Orientation to unit: [place] (smoking, visitation, chaplain services, helpline)  Outcome: Completed/Met Date Met:  11/16/14

## 2014-11-16 NOTE — Progress Notes (Signed)
Patient ID: Audrey [name], female   DOB: [birth year], 28 y.o.   MRN: [id number]  Discussed pt's situation with MFM, Alpha GulaPaul Whitecar.  Pt with reassurring nonreactive tracing, 8/8/BPP, 11/18, repeated 11/19 8/8 BPP Diabetes with oral control on glyburide 1.25 bid, morbidly obese, h/o IUFD  Pt scheduled for rLTCS/BTL  Agree pt to remain on continuous monitoring, c-section if gets nonreassuring.

## 2014-11-16 NOTE — Plan of Care (Signed)
Problem: Consults Goal: Orientation to unit: Plan of Care Outcome: Completed/Met Date Met:  11/16/14

## 2014-11-16 NOTE — Plan of Care (Signed)
Problem: Consults Goal: Orientation to unit: Room Outcome: Completed/Met Date Met:  11/16/14     

## 2014-11-17 ENCOUNTER — Inpatient Hospital Stay (HOSPITAL_COMMUNITY)
Admission: RE | Admit: 2014-11-17 | Payer: Medicaid Other | Source: Ambulatory Visit | Admitting: Obstetrics and Gynecology

## 2014-11-17 ENCOUNTER — Inpatient Hospital Stay (HOSPITAL_COMMUNITY): Payer: Medicaid Other | Admitting: Certified Registered Nurse Anesthetist

## 2014-11-17 ENCOUNTER — Encounter (HOSPITAL_COMMUNITY)
Admission: AD | Disposition: A | Discharge status: Home / Self Care | Payer: Self-pay | Source: Ambulatory Visit | Attending: Obstetrics and Gynecology

## 2014-11-17 ENCOUNTER — Encounter (HOSPITAL_COMMUNITY): Payer: Self-pay | Admitting: Certified Registered Nurse Anesthetist

## 2014-11-17 LAB — GLUCOSE, CAPILLARY
Glucose-Capillary: 63 mg/dL — ABNORMAL LOW (ref 70–99)
Glucose-Capillary: 73 mg/dL (ref 70–99)

## 2014-11-17 SURGERY — Surgical Case
Anesthesia: Spinal | Laterality: Bilateral

## 2014-11-17 MED ORDER — BUPIVACAINE HCL (PF) 0.5 % IJ SOLN
INTRAMUSCULAR | Status: DC | PRN
  Administered 2014-11-17: 20 mL

## 2014-11-17 MED ORDER — DIBUCAINE 1 % RE OINT: 1.0000 "application " | TOPICAL_OINTMENT | RECTAL | Status: DC | PRN

## 2014-11-17 MED ORDER — ONDANSETRON HCL 4 MG/2ML IJ SOLN: 4.0000 mg | Freq: Three times a day (TID) | INTRAMUSCULAR | Status: DC | PRN

## 2014-11-17 MED ORDER — ONDANSETRON HCL 4 MG/2ML IJ SOLN
INTRAMUSCULAR | Status: DC | PRN
  Administered 2014-11-17: 4 mg via INTRAVENOUS

## 2014-11-17 MED ORDER — OXYCODONE-ACETAMINOPHEN 5-325 MG PO TABS
2.0000 | ORAL_TABLET | ORAL | Status: DC | PRN
  Filled 2014-11-17: qty 2

## 2014-11-17 MED ORDER — LACTATED RINGERS IV SOLN
INTRAVENOUS | Status: DC
  Administered 2014-11-17 (×2): via INTRAVENOUS

## 2014-11-17 MED ORDER — KETOROLAC TROMETHAMINE 30 MG/ML IJ SOLN: 30.0000 mg | Freq: Four times a day (QID) | INTRAMUSCULAR | Status: DC | PRN

## 2014-11-17 MED ORDER — BUPIVACAINE HCL (PF) 0.5 % IJ SOLN
INTRAMUSCULAR | Status: AC
  Filled 2014-11-17: qty 30

## 2014-11-17 MED ORDER — ZOLPIDEM TARTRATE 5 MG PO TABS: 5.0000 mg | ORAL_TABLET | Freq: Every evening | ORAL | Status: DC | PRN

## 2014-11-17 MED ORDER — ACETAMINOPHEN 500 MG PO TABS
1000.0000 mg | ORAL_TABLET | Freq: Four times a day (QID) | ORAL | Status: AC
  Administered 2014-11-18: 1000 mg via ORAL
  Filled 2014-11-17 (×2): qty 2

## 2014-11-17 MED ORDER — MEPERIDINE HCL 25 MG/ML IJ SOLN: 6.2500 mg | INTRAMUSCULAR | Status: DC | PRN

## 2014-11-17 MED ORDER — KETOROLAC TROMETHAMINE 30 MG/ML IJ SOLN
15.0000 mg | Freq: Once | INTRAMUSCULAR | Status: AC | PRN
  Administered 2014-11-17: 30 mg via INTRAVENOUS

## 2014-11-17 MED ORDER — OXYTOCIN 40 UNITS IN LACTATED RINGERS INFUSION - SIMPLE MED: 62.5000 mL/h | INTRAVENOUS | Status: AC

## 2014-11-17 MED ORDER — FENTANYL CITRATE 0.05 MG/ML IJ SOLN
INTRAMUSCULAR | Status: AC
  Filled 2014-11-17: qty 2

## 2014-11-17 MED ORDER — MEPERIDINE HCL 25 MG/ML IJ SOLN
6.2500 mg | INTRAMUSCULAR | Status: DC | PRN
Start: 2014-11-17 — End: 2014-11-17

## 2014-11-17 MED ORDER — LACTATED RINGERS IV SOLN
INTRAVENOUS | Status: DC | PRN
  Administered 2014-11-17: 08:00:00 via INTRAVENOUS

## 2014-11-17 MED ORDER — NALOXONE HCL 0.4 MG/ML IJ SOLN: 0.4000 mg | INTRAMUSCULAR | Status: DC | PRN

## 2014-11-17 MED ORDER — CITRIC ACID-SODIUM CITRATE 334-500 MG/5ML PO SOLN
ORAL | Status: AC
  Administered 2014-11-17: 30 mL
  Filled 2014-11-17: qty 15

## 2014-11-17 MED ORDER — SIMETHICONE 80 MG PO CHEW
80.0000 mg | CHEWABLE_TABLET | Freq: Three times a day (TID) | ORAL | Status: DC
  Administered 2014-11-18 – 2014-11-19 (×4): 80 mg via ORAL
  Filled 2014-11-17 (×6): qty 1

## 2014-11-17 MED ORDER — WITCH HAZEL-GLYCERIN EX PADS: 1.0000 "application " | MEDICATED_PAD | CUTANEOUS | Status: DC | PRN

## 2014-11-17 MED ORDER — NALBUPHINE HCL 10 MG/ML IJ SOLN: 5.0000 mg | INTRAMUSCULAR | Status: DC | PRN

## 2014-11-17 MED ORDER — MENTHOL 3 MG MT LOZG: 1.0000 | LOZENGE | OROMUCOSAL | Status: DC | PRN

## 2014-11-17 MED ORDER — SIMETHICONE 80 MG PO CHEW
80.0000 mg | CHEWABLE_TABLET | ORAL | Status: DC
  Administered 2014-11-18 – 2014-11-19 (×2): 80 mg via ORAL
  Filled 2014-11-17 (×2): qty 1

## 2014-11-17 MED ORDER — MORPHINE SULFATE (PF) 0.5 MG/ML IJ SOLN
INTRAMUSCULAR | Status: DC | PRN
  Administered 2014-11-17: .15 mg via INTRATHECAL

## 2014-11-17 MED ORDER — ONDANSETRON HCL 4 MG PO TABS
4.0000 mg | ORAL_TABLET | ORAL | Status: DC | PRN
  Administered 2014-11-17: 4 mg via ORAL
  Filled 2014-11-17: qty 1

## 2014-11-17 MED ORDER — MIDAZOLAM HCL 2 MG/2ML IJ SOLN
0.5000 mg | Freq: Once | INTRAMUSCULAR | Status: DC | PRN
Start: 2014-11-17 — End: 2014-11-17

## 2014-11-17 MED ORDER — OXYTOCIN 40 UNITS IN LACTATED RINGERS INFUSION - SIMPLE MED
INTRAVENOUS | Status: DC | PRN
  Administered 2014-11-17: 40 [IU] via INTRAVENOUS

## 2014-11-17 MED ORDER — IBUPROFEN 800 MG PO TABS
800.0000 mg | ORAL_TABLET | Freq: Three times a day (TID) | ORAL | Status: DC
  Administered 2014-11-18 – 2014-11-19 (×3): 800 mg via ORAL
  Filled 2014-11-17 (×4): qty 1

## 2014-11-17 MED ORDER — SODIUM CHLORIDE 0.9 % IJ SOLN: 3.0000 mL | INTRAMUSCULAR | Status: DC | PRN

## 2014-11-17 MED ORDER — NALBUPHINE HCL 10 MG/ML IJ SOLN: 5.0000 mg | Freq: Once | INTRAMUSCULAR | Status: DC | PRN

## 2014-11-17 MED ORDER — DIPHENHYDRAMINE HCL 50 MG/ML IJ SOLN
INTRAMUSCULAR | Status: DC | PRN
  Administered 2014-11-17: 25 mg via INTRAVENOUS

## 2014-11-17 MED ORDER — SCOPOLAMINE 1 MG/3DAYS TD PT72
1.0000 | MEDICATED_PATCH | Freq: Once | TRANSDERMAL | Status: DC
  Administered 2014-11-17: 1.5 mg via TRANSDERMAL
  Filled 2014-11-17 (×2): qty 1

## 2014-11-17 MED ORDER — DEXTROSE 5 % IV SOLN
1.0000 ug/kg/h | INTRAVENOUS | Status: DC | PRN
  Filled 2014-11-17: qty 2

## 2014-11-17 MED ORDER — PRENATAL MULTIVITAMIN CH
1.0000 | ORAL_TABLET | Freq: Every day | ORAL | Status: DC
  Administered 2014-11-18 – 2014-11-19 (×2): 1 via ORAL
  Filled 2014-11-17 (×2): qty 1

## 2014-11-17 MED ORDER — KETAMINE HCL 10 MG/ML IJ SOLN
INTRAMUSCULAR | Status: DC | PRN
  Administered 2014-11-17 (×10): 10 mg via INTRAVENOUS

## 2014-11-17 MED ORDER — DIPHENHYDRAMINE HCL 25 MG PO CAPS: 25.0000 mg | ORAL_CAPSULE | Freq: Four times a day (QID) | ORAL | Status: DC | PRN

## 2014-11-17 MED ORDER — LANOLIN HYDROUS EX OINT: 1.0000 "application " | TOPICAL_OINTMENT | CUTANEOUS | Status: DC | PRN

## 2014-11-17 MED ORDER — ONDANSETRON HCL 4 MG/2ML IJ SOLN
4.0000 mg | INTRAMUSCULAR | Status: DC | PRN
  Administered 2014-11-17: 4 mg via INTRAVENOUS
  Filled 2014-11-17: qty 2

## 2014-11-17 MED ORDER — DIPHENHYDRAMINE HCL 25 MG PO CAPS: 25.0000 mg | ORAL_CAPSULE | ORAL | Status: DC | PRN

## 2014-11-17 MED ORDER — BUPIVACAINE IN DEXTROSE 0.75-8.25 % IT SOLN
INTRATHECAL | Status: DC | PRN
  Administered 2014-11-17: 1.6 mL via INTRATHECAL

## 2014-11-17 MED ORDER — KETOROLAC TROMETHAMINE 30 MG/ML IJ SOLN
INTRAMUSCULAR | Status: AC
  Administered 2014-11-17: 30 mg via INTRAVENOUS
  Filled 2014-11-17: qty 1

## 2014-11-17 MED ORDER — PROMETHAZINE HCL 25 MG/ML IJ SOLN
6.2500 mg | INTRAMUSCULAR | Status: DC | PRN
Start: 2014-11-17 — End: 2014-11-17

## 2014-11-17 MED ORDER — FENTANYL CITRATE 0.05 MG/ML IJ SOLN
25.0000 ug | INTRAMUSCULAR | Status: DC | PRN
  Administered 2014-11-17: 50 ug via INTRAVENOUS

## 2014-11-17 MED ORDER — SIMETHICONE 80 MG PO CHEW
80.0000 mg | CHEWABLE_TABLET | ORAL | Status: DC | PRN
  Filled 2014-11-17: qty 1

## 2014-11-17 MED ORDER — SENNOSIDES-DOCUSATE SODIUM 8.6-50 MG PO TABS
2.0000 | ORAL_TABLET | ORAL | Status: DC
  Administered 2014-11-18 – 2014-11-19 (×2): 2 via ORAL
  Filled 2014-11-17 (×2): qty 2

## 2014-11-17 MED ORDER — OXYCODONE-ACETAMINOPHEN 5-325 MG PO TABS
1.0000 | ORAL_TABLET | ORAL | Status: DC | PRN
  Administered 2014-11-18: 1 via ORAL

## 2014-11-17 MED ORDER — LABETALOL HCL 5 MG/ML IV SOLN
INTRAVENOUS | Status: DC | PRN
  Administered 2014-11-17 (×2): 5 mg via INTRAVENOUS
  Administered 2014-11-17 (×2): 2.5 mg via INTRAVENOUS
  Administered 2014-11-17: 5 mg via INTRAVENOUS

## 2014-11-17 MED ORDER — MIDAZOLAM HCL 5 MG/5ML IJ SOLN
INTRAMUSCULAR | Status: DC | PRN
  Administered 2014-11-17 (×2): 1 mg via INTRAVENOUS

## 2014-11-17 MED ORDER — PHENYLEPHRINE 8 MG IN D5W 100 ML (0.08MG/ML) PREMIX OPTIME
INJECTION | INTRAVENOUS | Status: DC | PRN
Start: 2014-11-17 — End: 2014-11-17
  Administered 2014-11-17: 60 ug/min via INTRAVENOUS

## 2014-11-17 MED ORDER — FENTANYL CITRATE 0.05 MG/ML IJ SOLN
INTRAMUSCULAR | Status: DC | PRN
  Administered 2014-11-17 (×2): 25 ug via INTRAVENOUS
  Administered 2014-11-17: 25 ug via INTRATHECAL
  Administered 2014-11-17: 50 ug via INTRAVENOUS

## 2014-11-17 MED ORDER — IBUPROFEN 600 MG PO TABS
600.0000 mg | ORAL_TABLET | Freq: Four times a day (QID) | ORAL | Status: DC | PRN
  Administered 2014-11-18: 600 mg via ORAL
  Filled 2014-11-17 (×2): qty 1

## 2014-11-17 MED ORDER — DIPHENHYDRAMINE HCL 50 MG/ML IJ SOLN: 12.5000 mg | INTRAMUSCULAR | Status: DC | PRN

## 2014-11-17 SURGICAL SUPPLY — 46 items
BARRIER ADHS 3X4 INTERCEED (GAUZE/BANDAGES/DRESSINGS) ×2 IMPLANT
BENZOIN TINCTURE PRP APPL 2/3 (GAUZE/BANDAGES/DRESSINGS) ×2 IMPLANT
CLAMP CORD UMBIL (MISCELLANEOUS) IMPLANT
CLOTH BEACON ORANGE TIMEOUT ST (SAFETY) ×2 IMPLANT
CONTAINER PREFILL 10% NBF 15ML (MISCELLANEOUS) IMPLANT
DRAPE SHEET LG 3/4 BI-LAMINATE (DRAPES) IMPLANT
DRSG OPSITE POSTOP 4X10 (GAUZE/BANDAGES/DRESSINGS) ×2 IMPLANT
DRSG TELFA 3X8 NADH (GAUZE/BANDAGES/DRESSINGS) ×2 IMPLANT
DURAPREP 26ML APPLICATOR (WOUND CARE) ×2 IMPLANT
ELECT REM PT RETURN 9FT ADLT (ELECTROSURGICAL) ×2
ELECTRODE REM PT RTRN 9FT ADLT (ELECTROSURGICAL) ×1 IMPLANT
EXTRACTOR VACUUM M CUP 4 TUBE (SUCTIONS) ×2 IMPLANT
GLOVE BIO SURGEON STRL SZ 6.5 (GLOVE) ×2 IMPLANT
GLOVE BIO SURGEON STRL SZ7.5 (GLOVE) ×2 IMPLANT
GLOVE BIOGEL PI IND STRL 7.0 (GLOVE) ×3 IMPLANT
GLOVE BIOGEL PI INDICATOR 7.0 (GLOVE) ×3
GLOVE SURG SS PI 6.5 STRL IVOR (GLOVE) ×2 IMPLANT
GOWN STRL NON-REIN LRG LVL3 (GOWN DISPOSABLE) ×2 IMPLANT
GOWN STRL REUS W/TWL LRG LVL3 (GOWN DISPOSABLE) ×4 IMPLANT
KIT ABG SYR 3ML LUER SLIP (SYRINGE) IMPLANT
NEEDLE HYPO 22GX1.5 SAFETY (NEEDLE) ×2 IMPLANT
NEEDLE HYPO 25X5/8 SAFETYGLIDE (NEEDLE) IMPLANT
NS IRRIG 1000ML POUR BTL (IV SOLUTION) ×2 IMPLANT
PACK C SECTION WH (CUSTOM PROCEDURE TRAY) ×2 IMPLANT
PAD OB MATERNITY 4.3X12.25 (PERSONAL CARE ITEMS) ×2 IMPLANT
RTRCTR C-SECT PINK 25CM LRG (MISCELLANEOUS) ×2 IMPLANT
SPONGE GAUZE 4X4 12PLY STER LF (GAUZE/BANDAGES/DRESSINGS) ×4 IMPLANT
SPONGE LAP 18X18 X RAY DECT (DISPOSABLE) ×4 IMPLANT
STAPLER VISISTAT 35W (STAPLE) ×2 IMPLANT
STRIP CLOSURE SKIN 1/2X4 (GAUZE/BANDAGES/DRESSINGS) ×2 IMPLANT
SUT MNCRL 0 VIOLET CTX 36 (SUTURE) ×2 IMPLANT
SUT MONOCRYL 0 CTX 36 (SUTURE) ×2
SUT PLAIN 1 NONE 54 (SUTURE) ×2 IMPLANT
SUT PLAIN 2 0 XLH (SUTURE) ×2 IMPLANT
SUT VIC AB 0 CT1 27 (SUTURE) ×22
SUT VIC AB 0 CT1 27XBRD ANBCTR (SUTURE) ×11 IMPLANT
SUT VIC AB 2-0 CT1 27 (SUTURE) ×1
SUT VIC AB 2-0 CT1 TAPERPNT 27 (SUTURE) ×1 IMPLANT
SUT VIC AB 3-0 CT1 27 (SUTURE) ×4
SUT VIC AB 3-0 CT1 TAPERPNT 27 (SUTURE) ×2 IMPLANT
SUT VIC AB 4-0 KS 27 (SUTURE) ×2 IMPLANT
SYR 20CC LL (SYRINGE) ×2 IMPLANT
SYR BULB IRRIGATION 50ML (SYRINGE) ×2 IMPLANT
TOWEL OR 17X24 6PK STRL BLUE (TOWEL DISPOSABLE) ×2 IMPLANT
TRAY FOLEY CATH 14FR (SET/KITS/TRAYS/PACK) ×2 IMPLANT
WATER STERILE IRR 1000ML POUR (IV SOLUTION) IMPLANT

## 2014-11-17 NOTE — Anesthesia Procedure Notes (Signed)
Spinal Patient location during procedure: OR Start time: 11/17/2014 7:34 AM Staffing Anesthesiologist: Brayton CavesJACKSON, Lelon Ikard Performed by: anesthesiologist  Preanesthetic Checklist Completed: patient identified, site marked, surgical consent, pre-op evaluation, timeout performed, IV checked, risks and benefits discussed and monitors and equipment checked Spinal Block Patient position: sitting Prep: DuraPrep Patient monitoring: heart rate, cardiac monitor, continuous pulse ox and blood pressure Approach: midline Location: L3-4 Injection technique: single-shot Needle Needle type: Sprotte  Needle gauge: 24 G Needle length: 9 cm Assessment Sensory level: T4 Additional Notes Patient identified.  Risk benefits discussed including failed block, incomplete pain control, headache, nerve damage, paralysis, blood pressure changes, nausea, vomiting, reactions to medication both toxic or allergic, and postpartum back pain.  Patient expressed understanding and wished to proceed.  All questions were answered.  Sterile technique used throughout procedure.  CSF was clear.  No parasthesia or other complications.  Please see nursing notes for vital signs.

## 2014-11-17 NOTE — Anesthesia Postprocedure Evaluation (Signed)
Anesthesia Post Note  Patient: Audrey [name]  Procedure(s) Performed: Procedure(s) (LRB): CESAREAN SECTION WITH BILATERAL TUBAL LIGATION (Bilateral)  Anesthesia type: Spinal  Patient location: PACU  Post pain: Pain level controlled  Post assessment: Post-op Vital signs reviewed  Last Vitals:  Filed Vitals:   11/17/14 1000  BP: 125/75  Pulse: 67  Temp: 36.9 C  Resp: 16    Post vital signs: Reviewed  Level of consciousness: awake  Complications: No apparent anesthesia complications

## 2014-11-17 NOTE — Plan of Care (Signed)
Problem: Phase I Progression Outcomes Goal: Foley catheter patent Outcome: Completed/Met Date Met:  11/17/14     

## 2014-11-17 NOTE — Plan of Care (Signed)
Problem: Phase I Progression Outcomes Goal: Pain controlled with appropriate interventions Outcome: Completed/Met Date Met:  11/17/14 Goal: OOB as tolerated unless otherwise ordered Outcome: Completed/Met Date Met:  11/17/14 Goal: IS, TCDB as ordered Outcome: Completed/Met Date Met:  11/17/14 Goal: Initial discharge plan identified Outcome: Completed/Met Date Met:  11/17/14

## 2014-11-17 NOTE — Op Note (Signed)
NAME:  Audrey [name], Audrey [name]                ACCOUNT NO.:  [id number]  MEDICAL RECORD NO.:  [id number]  LOCATION:  [facility]                          FACILITY:  [facility]  PHYSICIAN:  Sherron MondayJody Bovard, MD        DATE OF BIRTH:  31-Jan-1986  DATE OF PROCEDURE:  11/17/2014 DATE OF DISCHARGE:                              OPERATIVE REPORT   PREOPERATIVE DIAGNOSES:  Intrauterine pregnancy at 39 weeks with nonreactive fetal heart tracing, gestational diabetes, history of an intrauterine fetal death, and undesired fertility.  POSTOPERATIVE DIAGNOSES:  Intrauterine pregnancy at 39 weeks with nonreactive fetal heart tracing, gestational diabetes, history of an intrauterine fetal death, and undesired fertility, status post delivery.  PROCEDURE:  Low transverse cesarean section with bilateral tubal ligation by salpingectomy and an extensive lysis of adhesions.  SURGEON:  Sherron MondayJody Bovard, MD.  ASSISTANMalachi Pro:  Thomas F. Ambrose MantleHenley, M.D., and Candelaria CelesteJacob Stinson, D.O.  ANESTHESIA:  Local, spinal, and IV sedation.  IV FLUIDS:  2400 mL.  URINE OUTPUT:  200 mL, clear urine at the end of the procedure.  EBL:  900 mL.  FINDINGS:  Viable female infant at 7:54 a.m. with Apgars of 9 at 1 minute, 9 at 5 minutes and weight of 7 pounds 3 ounces.  Uterus with extensive adhesions to the peritoneum and omentum with tube involvement.  COMPLICATION:  None.  PATHOLOGY:  Bilateral tubal segments to Pathology.  Placenta to L and D.  DESCRIPTION OF PROCEDURE:  After informed consent was reviewed with the patient and her partner, she was transported to the OR where spinal anesthesia was induced and found to be adequate.  She was then placed on the table in supine position in the leftward tilt.  Pfannenstiel skin incision was made at the level of her previous incision, carried through the underlying layer of fascia.  The fascia was incised in midline and midline incision was extended laterally with Mayo scissors.  The superior aspect of the  fascial incision was grasped with Kocher clamps, elevating the rectus muscles, were dissected off both bluntly and sharply.  Midline was easily identified.  Peritoneum was entered bluntly.  Incision was extended due to aforementioned adhesions, Jon Gillslexis was unable to be placed using a Richardson and a bladder blade.  Her uterus was easily visualized, it was incised in transverse fashion and infant was delivered from vertex presentation.  Nose and mouth were suctioned on the field.  Cord was clamped and cut.  Infant was delivered with aid of a vacuum.  The uterus was cleared of all clot and debris as well as the placenta was delivered.  The uterine incision was closed in 2 layers of 0 Monocryl, first of which is running locked and the second as an imbricating layer.  The decision was made to proceed with the tubal, the adhesions were noted to be peritoneal to uterine fundus. These were doubly clamped with Kelly's and ligated, was doubly suture ligated.  The tube was then identified, followed out to the fimbriated end and the distal half of the tube was excised and sent to Pathology. Attention was turned to the right which in a similar fashion had large adhesions, was noted from the fundus  to the peritoneum, was doubly clamped and ligated.  This incision was into the uterine corpus and the uterus was closed in several layers.  The tube was followed out to the fimbriated end.  The distal third of the tube was removed and the stump was ligated with 3-0 Vicryl.  This was noted to be hemostatic.  The uterus was made hemostatic with several layers of sutures of 0 Vicryl as well as 3-0 Vicryl.  Interceed was applied to this area.  The peritoneum and rectus muscles were reapproximated with 2-0 Vicryl.  The fascia was closed with 0 Vicryl from either end, overlapping in the midline. Subcuticular tissue was made hemostatic and closed with 3-0 plain gut. The skin was closed in a subcuticular fashion  with 4-0 Vicryl on a Keith needle as well as several staples.  Benzoin __________ were applied. The patient tolerated the procedure well.  Sponge, lap, and needle count were correct x2 at the end of procedure.  During the procedure as the spinal was losing effectiveness, she had some ketamine __________ was also applied in the incision and Marcaine at the incision.     Sherron MondayJody Bovard, MD     JB/MEDQ  D:  11/17/2014  T:  11/17/2014  Job:  [id number]

## 2014-11-17 NOTE — Progress Notes (Signed)
Spoke with Bovard, MD about preference for blood in the OR or on stand by.  MD does not wish to have blood in the OR but readily available if needed.

## 2014-11-17 NOTE — Brief Op Note (Signed)
11/15/2014 - 11/17/2014  9:58 AM  PATIENT:  Audrey DacostaAlycia D [name]  28 y.o. female  PRE-OPERATIVE DIAGNOSIS:  Repeat Cesarean Section, Sterilization   POST-OPERATIVE DIAGNOSIS:  Repeat Cesarean Section, Sterilization   PROCEDURE:  Procedure(s): CESAREAN SECTION WITH BILATERAL TUBAL LIGATION (Bilateral), extensive LOA  SURGEON:  Surgeon(s) and Role:    * Sherian ReinJody Bovard-Stuckert, MD - Primary    * Bing Plumehomas F Henley, MD - Assisting    * Levie HeritageJacob J Stinson, DO - Assisting  ANESTHESIA:   local, spinal and IV sedation  EBL:  Total I/O In: 2400 [I.V.:2400] Out: 1100 [Urine:200; Blood:900]  FINDINGS: viable female infant at 7:54am apgars 9/9  wt 7#3, uterus with extensive adhesions to peritoneum and omentum.tubes involved.  BLOOD ADMINISTERED:none  DRAINS: Urinary Catheter (Foley)   LOCAL MEDICATIONS USED:  MARCAINE     SPECIMEN:  Source of Specimen:  B tubal segments, Placenta  DISPOSITION OF SPECIMEN: Pathology  and L&D  COUNTS:  YES  TOURNIQUET:  * No tourniquets in log *  DICTATION: .Other Dictation: Dictation Number [id number]  PLAN OF CARE: Admit to inpatient   PATIENT DISPOSITION:  PACU - hemodynamically stable.   Delay start of Pharmacological VTE agent (>24hrs) due to surgical blood loss or risk of bleeding: not applicable

## 2014-11-17 NOTE — Interval H&P Note (Signed)
History and Physical Interval Note:  11/17/2014 7:01 AM  Sherilyn DacostaAlycia D Jaros  has presented for surgery as scheduled after observation, with the diagnosis of Repeat C/Section/Sterilization   The various methods of treatment have been discussed with the patient and family. After consideration of risks, benefits and other options for treatment, the patient has consented to  Procedure(s): CESAREAN SECTION WITH BILATERAL TUBAL LIGATION (Bilateral) as a surgical intervention .  The patient's history has been reviewed, patient examined, no change in status, stable for surgery.  I have reviewed the patient's chart and labs.  Questions were answered to the patient's satisfaction.     Bovard-Stuckert, Demorio Seeley

## 2014-11-17 NOTE — Progress Notes (Signed)
Spoke with K.Jackson, MD in regards to patient's scheduled C/S this am, last CBC 11/15/14 at 10:38 and results. MD states not necessary for repeat this am. MD states OB provider will decide if blood is needed in OR due to high post partum hemorrhage risk status.

## 2014-11-17 NOTE — Anesthesia Preprocedure Evaluation (Addendum)
Anesthesia Evaluation  Patient identified by MRN, date of birth, ID band Patient awake    Reviewed: Allergy & Precautions, H&P , NPO status , Patient's Chart, lab work & pertinent test results  Airway Mallampati: III       Dental   Pulmonary  breath sounds clear to auscultation        Cardiovascular Exercise Tolerance: Good Rhythm:regular Rate:Normal     Neuro/Psych  Headaches,    GI/Hepatic GERD-  ,  Endo/Other  diabetesMorbid obesity  Renal/GU      Musculoskeletal   Abdominal   Peds  Hematology   Anesthesia Other Findings   Reproductive/Obstetrics (+) Pregnancy                            Anesthesia Physical Anesthesia Plan  ASA: III  Anesthesia Plan: Spinal   Post-op Pain Management:    Induction:   Airway Management Planned:   Additional Equipment:   Intra-op Plan:   Post-operative Plan:   Informed Consent: I have reviewed the patients History and Physical, chart, labs and discussed the procedure including the risks, benefits and alternatives for the proposed anesthesia with the patient or authorized representative who has indicated his/her understanding and acceptance.     Plan Discussed with: Anesthesiologist, CRNA and Surgeon  Anesthesia Plan Comments:         Anesthesia Quick Evaluation

## 2014-11-17 NOTE — Transfer of Care (Signed)
Immediate Anesthesia Transfer of Care Note  Patient: Audrey [name]  Procedure(s) Performed: Procedure(s): CESAREAN SECTION WITH BILATERAL TUBAL LIGATION (Bilateral)  Patient Location: PACU  Anesthesia Type:Spinal  Level of Consciousness: awake, alert  and patient cooperative  Airway & Oxygen Therapy: Patient Spontanous Breathing and Patient connected to nasal cannula oxygen  Post-op Assessment: Report given to PACU RN and Post -op Vital signs reviewed and stable  Post vital signs: Reviewed and stable  Complications: No apparent anesthesia complications

## 2014-11-17 NOTE — Lactation Note (Signed)
This note was copied from the chart of [name]. Lactation Consultation Note  Baby alert and rooting in crib.  P3, First time breastfeeding. Mother has large pendulous breasts. Reviewed hand expression.  Drops of colostrum expressed. Assisted mother in placing baby in football hold.  Baby latched with some assistance compressing breast for depth. Mother very nauseous and vomiting so unable to assist with feeding. Baby breastfeed for 10 min on right side, some swallows observed.  Then breastfed for 5 min on right breast. Baby needed help relatching during feeding.  Demonstrated to mother how to massage her breast to keep baby active. Briefly described outpatient service and gave mother brochure. Described cluster feeding and Mom encouraged to feed baby 8-12 times/24 hours and with feeding cues.     Patient Name: Girl Eliseo Squireslycia Denz [id number]'XToday's Date: 11/17/2014 Reason for consult: Initial assessment   Maternal Data Has patient been taught Hand Expression?: Yes  Feeding Feeding Type: Breast Fed Length of feed: 15 min (off and on)  LATCH Score/Interventions Latch: Repeated attempts needed to sustain latch, nipple held in mouth throughout feeding, stimulation needed to elicit sucking reflex. Intervention(s): Breast massage;Adjust position;Assist with latch  Audible Swallowing: A few with stimulation  Type of Nipple: Everted at rest and after stimulation  Comfort (Breast/Nipple): Soft / non-tender     Hold (Positioning): Full assist, staff holds infant at breast Intervention(s): Support Pillows;Breastfeeding basics reviewed;Skin to skin  LATCH Score: 6  Lactation Tools Discussed/Used     Consult Status Consult Status: Follow-up Date: 11/18/14 Follow-up type: In-patient    Audrey ByesBerkelhammer, Audrey [name] [facility] 11/17/2014, 1:09 PM

## 2014-11-18 ENCOUNTER — Encounter (HOSPITAL_COMMUNITY): Payer: Self-pay | Admitting: Obstetrics and Gynecology

## 2014-11-18 DIAGNOSIS — Z98891 History of uterine scar from previous surgery: Secondary | ICD-10-CM

## 2014-11-18 LAB — CBC
HEMATOCRIT: 27.5 % — AB (ref 36.0–46.0)
Hemoglobin: 8.5 g/dL — ABNORMAL LOW (ref 12.0–15.0)
MCH: 27.4 pg (ref 26.0–34.0)
MCHC: 30.9 g/dL (ref 30.0–36.0)
MCV: 88.7 fL (ref 78.0–100.0)
Platelets: 253 10*3/uL (ref 150–400)
RBC: 3.1 MIL/uL — ABNORMAL LOW (ref 3.87–5.11)
RDW: 16.6 % — ABNORMAL HIGH (ref 11.5–15.5)
WBC: 11.6 10*3/uL — AB (ref 4.0–10.5)

## 2014-11-18 LAB — GLUCOSE, CAPILLARY
GLUCOSE-CAPILLARY: 127 mg/dL — AB (ref 70–99)
Glucose-Capillary: 105 mg/dL — ABNORMAL HIGH (ref 70–99)

## 2014-11-18 MED ORDER — LORATADINE 10 MG PO TABS
10.0000 mg | ORAL_TABLET | Freq: Every day | ORAL | Status: DC
  Administered 2014-11-18: 10 mg via ORAL
  Filled 2014-11-18 (×2): qty 1

## 2014-11-18 NOTE — Plan of Care (Signed)
Problem: Phase I Progression Outcomes Goal: VS, stable, temp < 100.4 degrees F Outcome: Completed/Met Date Met:  11/18/14 Goal: Other Phase I Outcomes/Goals Outcome: Completed/Met Date Met:  11/18/14  Problem: Phase II Progression Outcomes Goal: Pain controlled on oral analgesia Outcome: Completed/Met Date Met:  11/18/14 Goal: Progress activity as tolerated unless otherwise ordered Outcome: Completed/Met Date Met:  11/18/14 Goal: Afebrile, VS remain stable Outcome: Completed/Met Date Met:  11/18/14 Goal: Rh isoimmunization per orders Outcome: Not Applicable Date Met:  31/59/45 Goal: Tolerating diet Outcome: Completed/Met Date Met:  11/18/14 Goal: Other Phase II Outcomes/Goals Outcome: Completed/Met Date Met:  11/18/14

## 2014-11-18 NOTE — Progress Notes (Signed)
Subjective: Postpartum Day 1: Cesarean Delivery Patient reports vomiting, incisional pain and tolerating PO.  Pain controlled, nl lochia  Objective: Vital signs in last 24 hours: Temp:  [97.8 F (36.6 C)-98.6 F (37 C)] 98.3 F (36.8 C) (11/21 0500) Pulse Rate:  [64-109] 73 (11/21 0500) Resp:  [16-20] 18 (11/21 0500) BP: (108-150)/(44-98) 109/44 mmHg (11/21 0500) SpO2:  [95 %-100 %] 97 % (11/20 2028)  Physical Exam:  General: alert and no distress Lochia: appropriate Uterine Fundus: firm Incision: healing well DVT Evaluation: No evidence of DVT seen on physical exam.   Recent Labs  11/15/14 1038 11/18/14 0552  HGB 10.5* 8.5*  HCT 32.6* 27.5*    Assessment/Plan: Status post Cesarean section. Doing well postoperatively. Except Vomiting. Continue current care.  Audrey [name], Audrey [name] 11/18/2014, 8:25 AM

## 2014-11-18 NOTE — Progress Notes (Signed)
Clinical Social Work Department PSYCHOSOCIAL ASSESSMENT - MATERNAL/CHILD 11/18/2014  Patient:  Audrey [name],Audrey [name]  Account Number:  [id number]  Admit Date:  11/15/2014  Childs Name:   Audrey [name]    Clinical Social Worker:  CUMI BEVEL, LCSW   Date/Time:  11/18/2014 01:10 PM  Date Referred:  11/17/2014   Referral source  Central Nursery     Referred reason  Psychosocial assessment   Other referral source:    I:  FAMILY / HOME ENVIRONMENT Child's legal guardian:  PARENT  Guardian - Name Guardian - Age Guardian - Address  Mcginnis, Donika [address]  [place], [place] [address]  [name], Charles     Other household support members/support persons Other support:    II  PSYCHOSOCIAL DATA Information Source:    Financial and Community Resources Employment:   Both parents employed   Financial resources:  Medicaid If Medicaid - County:   Other  WIC  Food Stamps   School / Grade:   Maternity Care Coordinator / Child Services Coordination / Early Interventions:  Cultural issues impacting care:    III  STRENGTHS Strengths  Supportive family/friends  Home prepared for Child (including basic supplies)  Adequate Resources   Strength comment:    IV  RISK FACTORS AND CURRENT PROBLEMS Current Problem:       V  SOCIAL WORK ASSESSMENT Acknowledged order for social work consult.  Mother had a fetal demise in 2014. Met with mother who was pleasant and receptive to social work intervention.  She is a single parent with one other dependent.  Mother talked about the loss of her baby in 2014 and had how nervous she was when she was ready to deliver, and how excited she is to have a healthy baby.  She did not get any formal counseling, but states that her church and family were extremely supportive.  FOB is reportedly very involved and support. He's the father of all of her children.  She denies any hx of substance abuse of mental illness.  She's very excited about newborn.  No acute  social concerns noted at this time.      VI SOCIAL WORK PLAN Social Work Plan  No Further Intervention Required / No Barriers to Discharge    

## 2014-11-18 NOTE — Progress Notes (Signed)
Patient complains of nasal congestion, sore throat, and non-productive cough.  States onset was 11/15/14, that she was seen by provider and prescribed a Z-pack.  Patient now requests only Claritin for symptoms. This RN spoke with Dr. Ellyn HackBovard by phone who gave verbal order for Loratadine 10mg  1 tablet daily PO.  Order read back to provider and entered into Epic per provider order.  Wolfgang PhoenixLeigha Rheana Casebolt, RN 11/18/14 2005

## 2014-11-19 LAB — GLUCOSE, CAPILLARY: Glucose-Capillary: 87 mg/dL (ref 70–99)

## 2014-11-19 MED ORDER — OXYCODONE-ACETAMINOPHEN 5-325 MG PO TABS: 1.0000 | ORAL_TABLET | ORAL | Status: DC | PRN

## 2014-11-19 MED ORDER — PRENATAL MULTIVITAMIN CH: 1.0000 | ORAL_TABLET | Freq: Every day | ORAL | Status: DC

## 2014-11-19 MED ORDER — IBUPROFEN 800 MG PO TABS: 800.0000 mg | ORAL_TABLET | Freq: Three times a day (TID) | ORAL | Status: DC

## 2014-11-19 MED ORDER — LORATADINE 10 MG PO TABS: 10.0000 mg | ORAL_TABLET | Freq: Every day | ORAL | Status: DC

## 2014-11-19 NOTE — Lactation Note (Signed)
This note was copied from the chart of Audrey Eliseo SquiresAlycia Kentner. Lactation Consultation Note: Follow up visit with mom before DC. Baby got formula through the night. Mom reports that baby has been fussy and spitting up so she wants to breast feed. Baby has nursed for 30 minutes but still fussy. Assisted mom with latch to right breast. Baby latched well then fussy and off the breast after about 5 min. Mom wants to give formula to calm baby. Encouraged to always BF first then give formula if baby still fussy to promote a good milk supply. Has been pumping some but states she is not getting anything. Encouragedment given. Mom reports that milk came in after her last baby which she lost. No questions at present. To call prn  Patient Name: Audrey Eliseo Squireslycia Ponder ZOXWR'UToday's Date: 11/19/2014 Reason for consult: Follow-up assessment   Maternal Data Formula Feeding for Exclusion: No Has patient been taught Hand Expression?: Yes Does the patient have breastfeeding experience prior to this delivery?: No  Feeding Feeding Type: Breast Fed Length of feed: 5 min  LATCH Score/Interventions Latch: Grasps breast easily, tongue down, lips flanged, rhythmical sucking.  Audible Swallowing: A few with stimulation  Type of Nipple: Flat  Comfort (Breast/Nipple): Soft / non-tender     Hold (Positioning): No assistance needed to correctly position infant at breast. Intervention(s): Support Pillows  LATCH Score: 8  Lactation Tools Discussed/Used WIC Program: Yes   Consult Status Consult Status: Complete    Pamelia HoitWeeks, Sequoyah Ramone D 11/19/2014, 10:06 AM

## 2014-11-19 NOTE — Progress Notes (Signed)
Subjective: Postpartum Day 2: Cesarean Delivery Patient reports incisional pain and tolerating PO.  Nl lochia, pain controlled  Objective: Vital signs in last 24 hours: Temp:  [98 F (36.7 C)-98.4 F (36.9 C)] 98.4 F (36.9 C) (11/22 0550) Pulse Rate:  [89-91] 91 (11/22 0550) Resp:  [18-19] 19 (11/22 0550) BP: (121-127)/(67-69) 121/67 mmHg (11/22 0550)  Physical Exam:  General: alert and no distress Lochia: appropriate Uterine Fundus: firm Incision: healing well DVT Evaluation: No evidence of DVT seen on physical exam.   Recent Labs  11/18/14 0552  HGB 8.5*  HCT 27.5*    Assessment/Plan: Status post Cesarean section. Doing well postoperatively.  Continue current care.  Bovard-Stuckert, Venora Kautzman 11/19/2014, 8:54 AM

## 2014-11-19 NOTE — Plan of Care (Signed)
Problem: Consults Goal: Postpartum Patient Education (See Patient Education module for education specifics.)  Outcome: Completed/Met Date Met:  11/19/14  Problem: Discharge Progression Outcomes Goal: Barriers To Progression Addressed/Resolved Outcome: Completed/Met Date Met:  34/28/76 Goal: Complications resolved/controlled Outcome: Completed/Met Date Met:  11/19/14 Goal: Afebrile, VS remain stable at discharge Outcome: Completed/Met Date Met:  11/19/14 Goal: Remove staples per MD order Outcome: Completed/Met Date Met:  11/19/14 Goal: MMR given as ordered Outcome: Not Applicable Date Met:  81/15/72 Goal: Other Discharge Outcomes/Goals Outcome: Completed/Met Date Met:  11/19/14

## 2014-11-19 NOTE — Plan of Care (Signed)
Problem: Consults Goal: Skin Care Protocol Initiated - if Braden Score 18 or less If consults are not indicated, leave blank or document N/A  Outcome: Not Applicable Date Met:  41/75/30 Goal: Nutrition Consult-if indicated Outcome: Not Applicable Date Met:  10/40/45 Goal: Diabetes Guidelines if Diabetic/Glucose > 140 If diabetic or lab glucose is > 140 mg/dl - Initiate Diabetes/Hyperglycemia Guidelines & Document Interventions  Outcome: Not Applicable Date Met:  91/36/85  Problem: Phase I Progression Outcomes Goal: Voiding adequately Outcome: Completed/Met Date Met:  11/19/14  Problem: Phase II Progression Outcomes Goal: Incision intact & without signs/symptoms of infection Outcome: Completed/Met Date Met:  11/19/14  Problem: Discharge Progression Outcomes Goal: Activity appropriate for discharge plan Outcome: Completed/Met Date Met:  11/19/14 Goal: Tolerating diet Outcome: Completed/Met Date Met:  11/19/14 Goal: Pain controlled with appropriate interventions Outcome: Completed/Met Date Met:  11/19/14 Goal: Discharge plan in place and appropriate Outcome: Completed/Met Date Met:  11/19/14

## 2014-11-19 NOTE — Discharge Summary (Signed)
Obstetric Discharge Summary Reason for Admission: NRNST Prenatal Procedures: NST and BPP Intrapartum Procedures: cesarean: low cervical, transverse and tubal ligationby salpingectomy Postpartum Procedures: none Complications-Operative and Postpartum: none HEMOGLOBIN  Date Value Ref Range Status  11/18/2014 8.5* 12.0 - 15.0 g/dL Final   HCT  Date Value Ref Range Status  11/18/2014 27.5* 36.0 - 46.0 % Final    Physical Exam:  General: alert, cooperative and no distress Lochia: appropriate Uterine Fundus: firm Incision: healing well DVT Evaluation: No evidence of DVT seen on physical exam.  Discharge Diagnoses: Term Pregnancy-delivered  Discharge Information: Date: 11/19/2014 Activity: pelvic rest Diet: routine Medications: PNV, Ibuprofen, Percocet and Claritin Condition: stable Instructions: refer to practice specific booklet Discharge to: home Follow-up Information    Follow up with Bovard-Stuckert, Augusto GambleJody, MD. Call in 2 weeks.   Specialty:  Obstetrics and Gynecology   Why:  2wk - incision check, 11/22/14 for staple removal!!!, 6 wk full postpartum check   Contact information:   [address] [place] [address] [phone]       Newborn Data: Live born female  Birth Weight: 7 lb 3 oz (3260 g) APGAR: 9, 9  Home with mother.  Bovard-Stuckert, Delan Ksiazek 11/19/2014, 12:59 PM

## 2014-11-19 NOTE — Progress Notes (Signed)
Patient ID: Audrey [name], female   DOB: Nov 25, 1986, 28 y.o.   MRN: [id number] Pt desires d/c to home, baby d/c'd.  Will d/c with percocet, PNV, Motrin, Claritin. F/u next Wednesday for staple removal

## 2014-11-20 ENCOUNTER — Encounter (HOSPITAL_COMMUNITY): Payer: Self-pay | Admitting: Obstetrics and Gynecology

## 2015-07-12 IMAGING — US US FETAL BPP W/O NONSTRESS
1 series · 9 of 9 positions shown · non-contrast
Comparison: none

[Series 1: us fetal bpp w/o nonstress · non-contrast · 9 acquisitions, 9 frames shown]
[im 1/9]
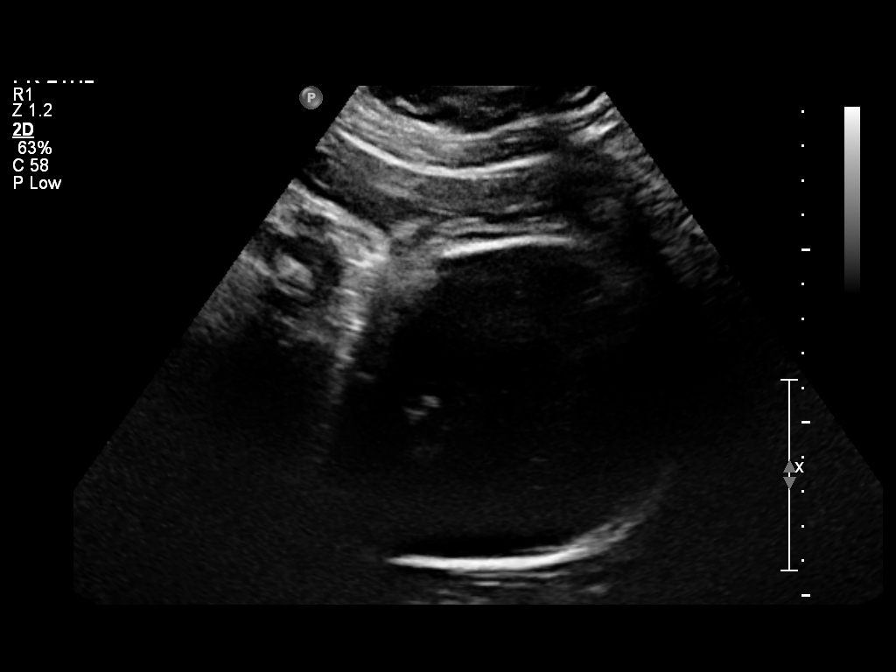
[im 2/9]
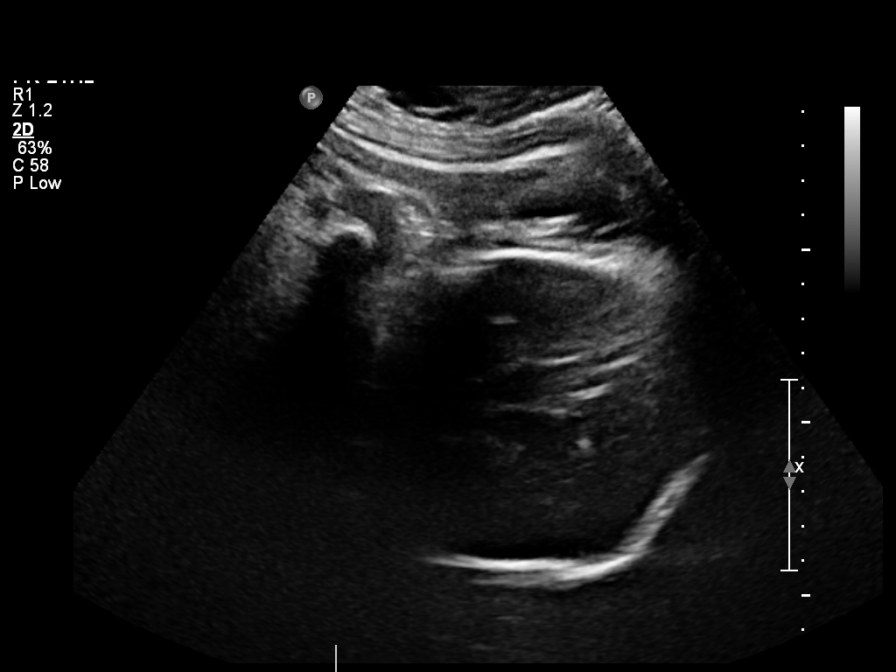
[im 3/9]
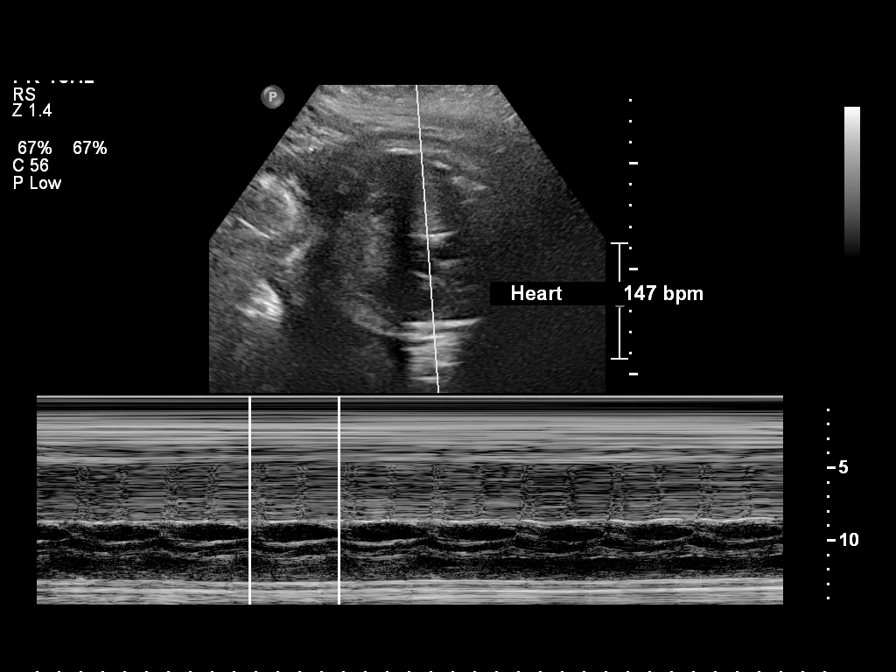
[im 4/9]
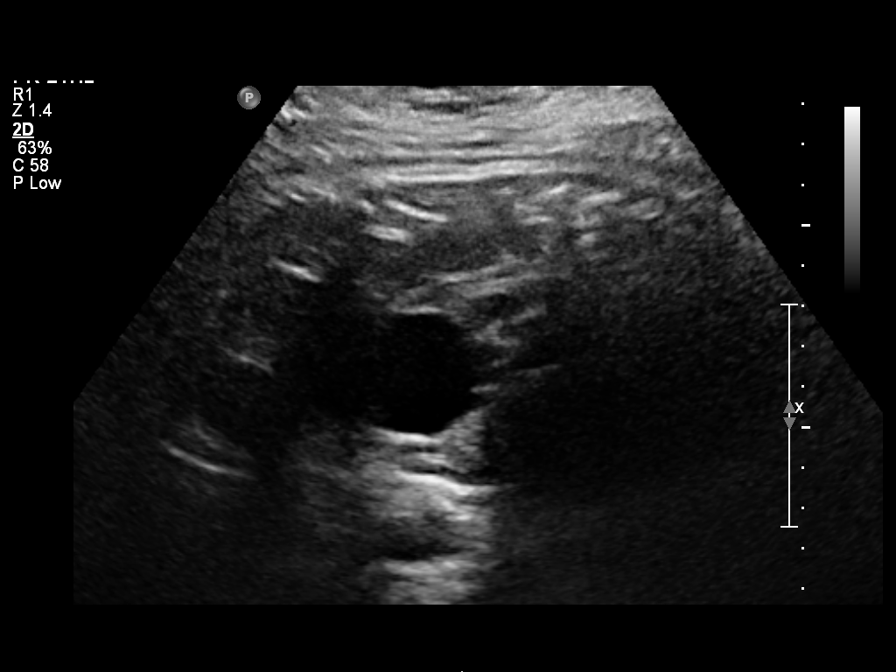
[im 5/9]
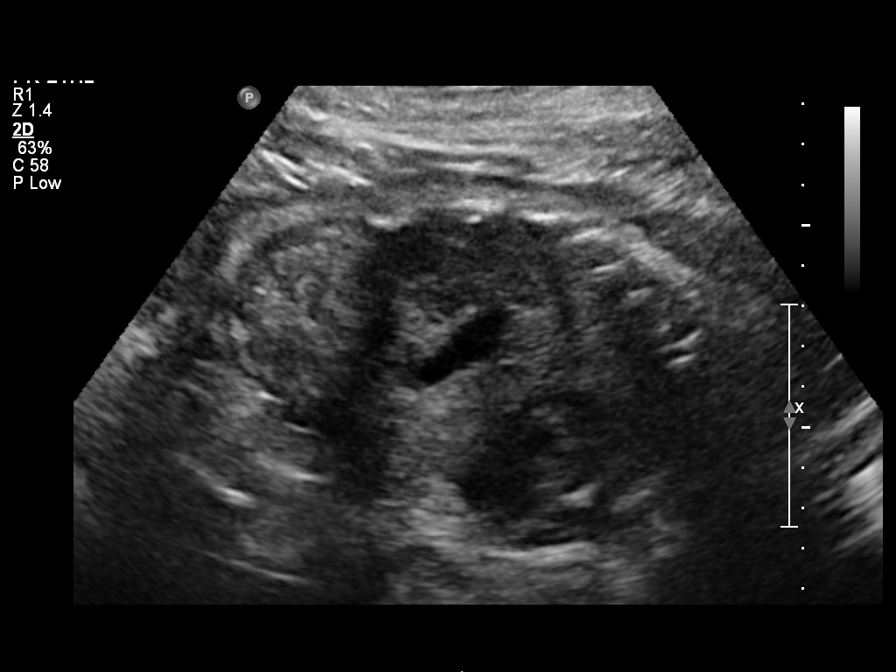
[im 6/9]
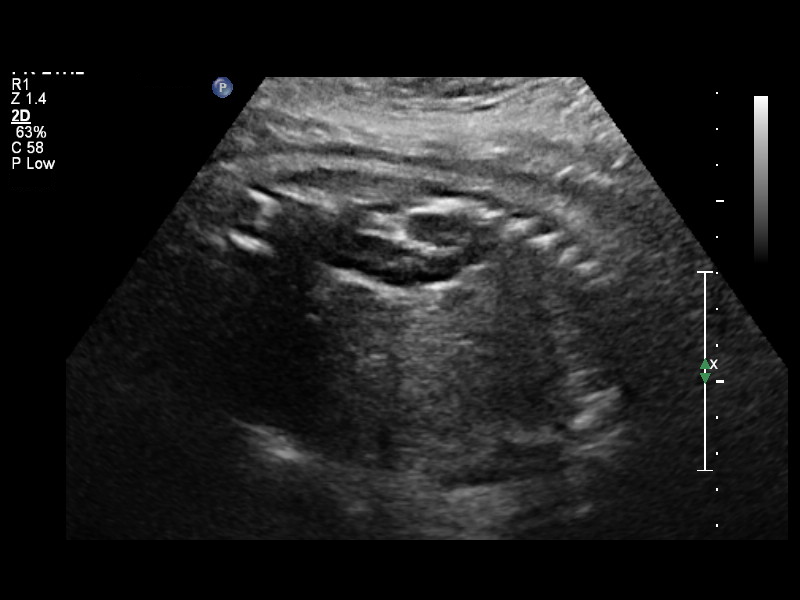
[im 7/9]
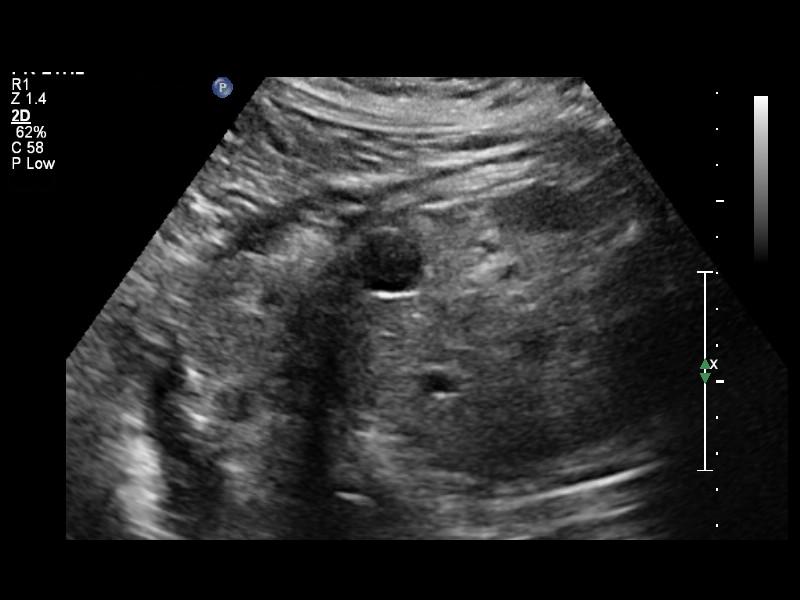
[im 8/9]
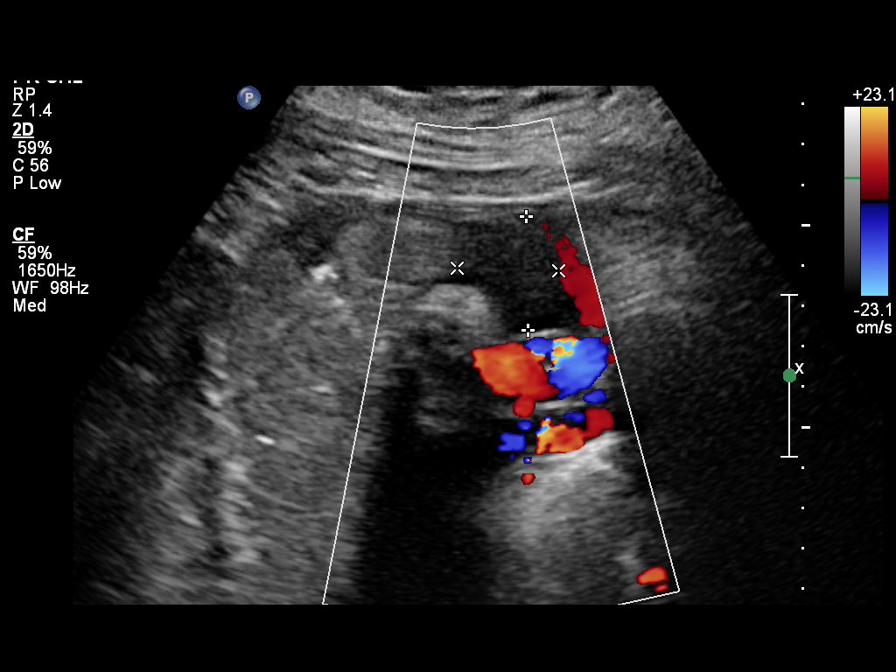
[im 9/9]
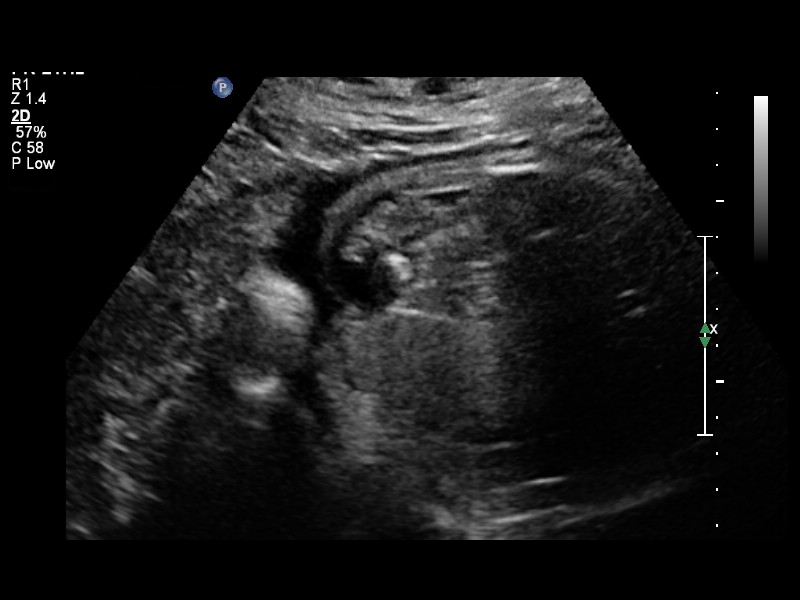

[9 of 9 positions shown; findings below may reference images not displayed]

OBSTETRICS REPORT
                      (Signed Final 11/16/2014 [DATE])

Service(s) Provided

Indications

 Hypertension - Gestational
 Non-reactive NST
 38 weeks gestation of pregnancy
 Maternal morbid obesity
 Gestational diabetes in pregnancy, diet controlled
Fetal Evaluation

 Num Of Fetuses:    1
 Fetal Heart Rate:  147                          bpm
 Cardiac Activity:  Observed
 Presentation:      Cephalic

 Amniotic Fluid
 AFI FV:      Subjectively low-normal
                                             Larg Pckt:     2.8  cm
Biophysical Evaluation

 Amniotic F.V:   Within normal limits       F. Tone:        Observed
 F. Movement:    Observed                   Score:          [DATE]
 F. Breathing:   Observed
Gestational Age

 LMP:           42w 1d        Date:  01/24/14                 EDD:   10/31/14
 Clinical EDD:  38w 5d                                        EDD:   11/24/14
 Best:          38w 5d     Det. By:  Clinical EDD             EDD:   11/24/14
Anatomy

 Stomach:          Appears normal, left   Bladder:          Appears normal
                   sided
Impression

 Single IUP at 38w 5d
 BPP [DATE]
 Subjectivley low-normal amniotic fluid volume (max vertical
 pocket 2.8 cm)
Recommendations

 Consider limited ultrasound for AFI in 1 week.
 Otherwise, follow  up ultrasounds as clinically indicated

 questions or concerns.

## 2015-10-07 ENCOUNTER — Encounter (HOSPITAL_COMMUNITY): Payer: Self-pay | Admitting: Emergency Medicine

## 2015-10-07 ENCOUNTER — Emergency Department (HOSPITAL_COMMUNITY): Payer: Medicaid Other

## 2015-10-07 ENCOUNTER — Emergency Department (HOSPITAL_COMMUNITY)
Admission: EM | Admit: 2015-10-07 | Discharge: 2015-10-07 | Disposition: A | Discharge status: Home / Self Care | Payer: Self-pay | Attending: Emergency Medicine | Admitting: Emergency Medicine

## 2015-10-07 DIAGNOSIS — R0789 Other chest pain: Secondary | ICD-10-CM | POA: Insufficient documentation

## 2015-10-07 DIAGNOSIS — Z791 Long term (current) use of non-steroidal anti-inflammatories (NSAID): Secondary | ICD-10-CM | POA: Insufficient documentation

## 2015-10-07 DIAGNOSIS — Z8632 Personal history of gestational diabetes: Secondary | ICD-10-CM | POA: Insufficient documentation

## 2015-10-07 DIAGNOSIS — R05 Cough: Secondary | ICD-10-CM | POA: Insufficient documentation

## 2015-10-07 DIAGNOSIS — R0602 Shortness of breath: Secondary | ICD-10-CM | POA: Insufficient documentation

## 2015-10-07 DIAGNOSIS — Z79899 Other long term (current) drug therapy: Secondary | ICD-10-CM | POA: Insufficient documentation

## 2015-10-07 DIAGNOSIS — E119 Type 2 diabetes mellitus without complications: Secondary | ICD-10-CM | POA: Insufficient documentation

## 2015-10-07 DIAGNOSIS — K219 Gastro-esophageal reflux disease without esophagitis: Secondary | ICD-10-CM | POA: Insufficient documentation

## 2015-10-07 DIAGNOSIS — Z8759 Personal history of other complications of pregnancy, childbirth and the puerperium: Secondary | ICD-10-CM | POA: Insufficient documentation

## 2015-10-07 MED ORDER — IBUPROFEN 800 MG PO TABS: 800.0000 mg | ORAL_TABLET | Freq: Three times a day (TID) | ORAL | Status: DC | PRN

## 2015-10-07 MED ORDER — IBUPROFEN 800 MG PO TABS
800.0000 mg | ORAL_TABLET | Freq: Once | ORAL | Status: DC
  Filled 2015-10-07: qty 1

## 2015-10-07 NOTE — ED Notes (Signed)
Pt states she started having right sided chest pain- pain extends into right arm. Right arm hurts to move. Pt states she is also having some SOB. Pt denies N/V. States she is somewhat lightheaded but not dizzy.

## 2015-10-07 NOTE — ED Provider Notes (Signed)
CSN: [id number]     Arrival date & time 10/07/15  1037 History   First MD Initiated Contact with Patient 10/07/15 1114     Chief Complaint  Patient presents with  . Shortness of Breath  . Chest Pain  . Arm Pain     (Consider location/radiation/quality/duration/timing/severity/associated sxs/prior Treatment) HPI Complains of right-sided parasternal nonradiating chest pain onset 2 days ago. Pain is worse with moving her right arm. Improved with remaining still She denies arm pain. Other associated symptoms include nonproductive cough and mild shortness of breath though she is not short of breath presently. No other associated symptoms. Treated with Tylenol, without adequate pain relief. Past Medical History  Diagnosis Date  . Preeclampsia   . Diabetes mellitus without complication (HCC)   . Pregnancy induced hypertension   . Gestational diabetes   . Fetal demise 05/19/2013  . S/P cesarean section 05/20/2013  . GERD (gastroesophageal reflux disease)     with pregnancy  . Headache     migraines  . Hemorrhage after delivery of fetus 08/12/2006  . S/P cesarean section 11/18/2014   Past Surgical History  Procedure Laterality Date  . Cesarean section    . Toenail excision    . Wisdom tooth extraction    . Cesarean section N/A 05/19/2013    Procedure: CESAREAN SECTION;  Surgeon: Sherron Monday, MD;  Location: [facility];  Service: Obstetrics;  Laterality: N/A;  . Cesarean section with bilateral tubal ligation Bilateral 11/17/2014    Procedure: CESAREAN SECTION WITH BILATERAL TUBAL LIGATION;  Surgeon: Sherian Rein, MD;  Location: [facility];  Service: Obstetrics;  Laterality: Bilateral;   Family History  Problem Relation Age of Onset  . Asthma Other   . Hyperlipidemia Other   . Obesity Other    Social History  Substance Use Topics  . Smoking status: Never Smoker   . Smokeless tobacco: Never Used  . Alcohol Use: No   OB History    Gravida Para Term Preterm AB TAB SAB Ectopic  Multiple Living   0 0 0 0 0 0 2     Review of Systems  Constitutional: Negative.   HENT: Negative.   Respiratory: Positive for cough and shortness of breath.   Cardiovascular: Positive for chest pain.  Gastrointestinal: Negative.   Musculoskeletal: Negative.   Skin: Negative.   Neurological: Negative.   Psychiatric/Behavioral: Negative.   All other systems reviewed and are negative.     Allergies  Review of patient's allergies indicates no known allergies.  Home Medications   Prior to Admission medications   Medication Sig Start Date End Date Taking? Authorizing Provider  acetaminophen (TYLENOL) 500 MG tablet Take 1,000 mg by mouth every 6 (six) hours as needed for headache.    Historical Provider, MD  ibuprofen (ADVIL,MOTRIN) 800 MG tablet Take 1 tablet (800 mg total) by mouth every 8 (eight) hours. 11/19/14   Sherian Rein, MD  loratadine (CLARITIN) 10 MG tablet Take 1 tablet (10 mg total) by mouth daily. 11/19/14   Sherian Rein, MD  omeprazole (PRILOSEC) 20 MG capsule Take 20 mg by mouth daily as needed (heartburn).     Historical Provider, MD  oxyCODONE-acetaminophen (PERCOCET/ROXICET) 5-325 MG per tablet Take 1 tablet by mouth every 4 (four) hours as needed (for pain scale less than 7). 11/19/14   Sherian Rein, MD  Prenatal Vit-Fe Fumarate-FA (PRENATAL MULTIVITAMIN) TABS tablet Take 1 tablet by mouth daily at 12 noon. 11/19/14   Sherian Rein, MD  promethazine (PHENERGAN) 25  MG tablet Take 25 mg by mouth every 6 (six) hours as needed for nausea or vomiting.    Historical Provider, MD   BP 109/74 mmHg  Pulse 80  Temp(Src) 98.8 F (37.1 C) (Oral)  Resp 25  Ht 5\' 7"  (1.702 m)  SpO2 99%  LMP 09/13/2015 Physical Exam  Constitutional: She is oriented to person, place, and time. She appears well-developed and well-nourished. No distress.  HENT:  Head: Normocephalic and atraumatic.  Eyes: Conjunctivae are normal. Pupils are equal, round,  and reactive to light.  Neck: Neck supple. No tracheal deviation present. No thyromegaly present.  Cardiovascular: Normal rate and regular rhythm.   No murmur heard. Pulmonary/Chest: Effort normal and breath sounds normal. She exhibits tenderness.  Mollie tender right parasternal area. Chest pain is easily reproducible by forcible abduction of her right shoulder  Abdominal: Soft. Bowel sounds are normal. She exhibits no distension. There is no tenderness.  Musculoskeletal: Normal range of motion. She exhibits no edema or tenderness.  Neurological: She is alert and oriented to person, place, and time. Coordination normal.  Skin: Skin is warm and dry. No rash noted.  Psychiatric: She has a normal mood and affect.  Nursing note and vitals reviewed.   ED Course  Procedures (including critical care time) Labs Review Labs Reviewed - No data to display  Imaging Review No results found. I have personally reviewed and evaluated these images and lab results as part of my medical decision-making.   EKG Interpretation   Date/Time:  Sunday October 07 2015 10:52:10 EDT Ventricular Rate:  73 PR Interval:  137 QRS Duration: 95 QT Interval:  401 QTC Calculation: 442 R Axis:   -9 Text Interpretation:  Sinus rhythm Baseline wander in lead(s) V3 No old  tracing to compare Confirmed by Adelaida Reindel  MD, Zyden Suman [phone]) on 10/07/2015  11:15:03 AM     Declines pain medicine 2:40 PM requesting pain medicine. Motrin ordered. Chest x-ray viewed by me Results for orders placed or performed during the hospital encounter of 11/15/14  Urinalysis, Routine w reflex microscopic  Result Value Ref Range   Color, Urine YELLOW YELLOW   APPearance CLEAR CLEAR   Specific Gravity, Urine 1.025 1.005 - 1.030   pH 6.5 5.0 - 8.0   Glucose, UA NEGATIVE NEGATIVE mg/dL   Hgb urine dipstick SMALL (A) NEGATIVE   Bilirubin Urine NEGATIVE NEGATIVE   Ketones, ur 15 (A) NEGATIVE mg/dL   Protein, ur NEGATIVE NEGATIVE mg/dL    Urobilinogen, UA 1.0 0.0 - 1.0 mg/dL   Nitrite NEGATIVE NEGATIVE   Leukocytes, UA NEGATIVE NEGATIVE  Urine microscopic-add on  Result Value Ref Range   Squamous Epithelial / LPF FEW (A) RARE   RBC / HPF 3-6 <3 RBC/hpf   Bacteria, UA MANY (A) RARE  OB RESULTS CONSOLE Rubella Antibody  Result Value Ref Range   Rubella Immune   Glucose, capillary  Result Value Ref Range   Glucose-Capillary 94 70 - 99 mg/dL  Glucose, capillary  Result Value Ref Range   Glucose-Capillary 61 (L) 70 - 99 mg/dL  Glucose, capillary  Result Value Ref Range   Glucose-Capillary 63 (L) 70 - 99 mg/dL  Glucose, capillary  Result Value Ref Range   Glucose-Capillary 73 70 - 99 mg/dL  Glucose, capillary  Result Value Ref Range   Glucose-Capillary 90 70 - 99 mg/dL  Glucose, capillary  Result Value Ref Range   Glucose-Capillary 84 70 - 99 mg/dL  Glucose, capillary  Result Value Ref Range  Glucose-Capillary 63 (L) 70 - 99 mg/dL  Glucose, capillary  Result Value Ref Range   Glucose-Capillary 73 70 - 99 mg/dL  CBC  Result Value Ref Range   WBC 11.6 (H) 4.0 - 10.5 K/uL   RBC 3.10 (L) 3.87 - 5.11 MIL/uL   Hemoglobin 8.5 (L) 12.0 - 15.0 g/dL   HCT 29.5 (L) 28.4 - 13.2 %   MCV 88.7 78.0 - 100.0 fL   MCH 27.4 26.0 - 34.0 pg   MCHC 30.9 30.0 - 36.0 g/dL   RDW 44.0 (H) 10.2 - 72.5 %   Platelets 253 150 - 400 K/uL  Glucose, capillary  Result Value Ref Range   Glucose-Capillary 105 (H) 70 - 99 mg/dL   Comment 1 Documented in Chart    Comment 2 Notify RN   Glucose, capillary  Result Value Ref Range   Glucose-Capillary 127 (H) 70 - 99 mg/dL  Glucose, capillary  Result Value Ref Range   Glucose-Capillary 87 70 - 99 mg/dL   Dg Chest 2 View  36/05/4402   CLINICAL DATA:  Dry cough for 1 month. Right-sided chest pain and shortness of breath.  EXAM: CHEST  2 VIEW  COMPARISON:  None.  FINDINGS: Cardiomediastinal silhouette is normal. Mediastinal contours appear intact.  There is no evidence of focal airspace  consolidation, pleural effusion or pneumothorax.  Osseous structures are without acute abnormality. Soft tissues are grossly normal.  IMPRESSION: No radiographic evidence of acute cardiopulmonary abnormality.   Electronically Signed   By: Ted Mcalpine M.D.   On: 10/07/2015 12:26    MDM  Strongly doubt acute coronary syndrome. Highly atypical symptoms in this young female. Doubt pulmonary embolus. No shortness of breath at present. Occasional cough. Symptoms are consistent with chest wall pain Final diagnoses:  None   Plan prescription Motrin Referral [facility] and resource guide to get primary care physician Diagnosis chest wall pain     Doug Sou, MD 10/07/15 1447

## 2015-10-07 NOTE — ED Notes (Signed)
Patient transported to X-ray 

## 2015-10-07 NOTE — ED Notes (Signed)
Pt holding right arm close to chest stating it hurts to straighten right arm. States it pulls in chest and is painful

## 2015-10-07 NOTE — Discharge Instructions (Signed)
Chest Wall Pain Call the [place] in community wellness Center or any of the numbers on the resource guide to get a primary care physician and to be seen if not better in a week Chest wall pain is pain in or around the bones and muscles of your chest. Sometimes, an injury causes this pain. Sometimes, the cause may not be known. This pain may take several weeks or longer to get better. HOME CARE INSTRUCTIONS  Pay attention to any changes in your symptoms. Take these actions to help with your pain:   Rest as told by your health care provider.   Avoid activities that cause pain. These include any activities that use your chest muscles or your abdominal and side muscles to lift heavy items.   If directed, apply ice to the painful area:  Put ice in a plastic bag.  Place a towel between your skin and the bag.  Leave the ice on for 20 minutes, 2-3 times per day.  Take over-the-counter and prescription medicines only as told by your health care provider.  Do not use tobacco products, including cigarettes, chewing tobacco, and e-cigarettes. If you need help quitting, ask your health care provider.  Keep all follow-up visits as told by your health care provider. This is important. SEEK MEDICAL CARE IF:  You have a fever.  Your chest pain becomes worse.  You have new symptoms. SEEK IMMEDIATE MEDICAL CARE IF:  You have nausea or vomiting.  You feel sweaty or light-headed.  You have a cough with phlegm (sputum) or you cough up blood.  You develop shortness of breath.   This information is not intended to replace advice given to you by your health care provider. Make sure you discuss any questions you have with your health care provider.   Document Released: 12/15/2005 Document Revised: 09/05/2015 Document Reviewed: 03/12/2015 Elsevier Interactive Patient Education 2016 [employer].  Emergency Department Resource Guide 1) Find a Doctor and Pay Out of Pocket Although you won't  have to find out who is covered by your insurance plan, it is a good idea to ask around and get recommendations. You will then need to call the office and see if the doctor you have chosen will accept you as a new patient and what types of options they offer for patients who are self-pay. Some doctors offer discounts or will set up payment plans for their patients who do not have insurance, but you will need to ask so you aren't surprised when you get to your appointment.  2) Contact Your Local Health Department Not all health departments have doctors that can see patients for sick visits, but many do, so it is worth a call to see if yours does. If you don't know where your local health department is, you can check in your phone book. The CDC also has a tool to help you locate your state's health department, and many state websites also have listings of all of their local health departments.  3) Find a Walk-in Clinic If your illness is not likely to be very severe or complicated, you may want to try a walk in clinic. These are popping up all over the country in pharmacies, drugstores, and shopping centers. They're usually staffed by nurse practitioners or physician assistants that have been trained to treat common illnesses and complaints. They're usually fairly quick and inexpensive. However, if you have serious medical issues or chronic medical problems, these are probably not your best option.  No  Primary Care Doctor: - Call Health Connect at  [phone] - they can help you locate a primary care doctor that  accepts your insurance, provides certain services, etc. - Physician Referral Service- [phone]  Chronic Pain Problems: Organization         Address  Phone   Notes  [facility]  [phone] Patients need to be referred by their primary care doctor.   Medication Assistance: Organization         Address  Phone   Notes  [facility] Medication [facility]  [address] [place] [place], [place] [address] [phone] --Must be a resident of [facility] -- Must have NO insurance coverage whatsoever (no Medicaid/ Medicare, etc.) -- The pt. MUST have a primary care doctor that directs their care regularly and follows them in the community   [facility]ist  [phone]   [employer]  [phone]    Agencies that provide inexpensive medical care: Organization         Address  Phone   Notes  [facility]  [phone]   [facility]    [phone]   [facility] [address] [place], [place] [address] [phone]   [facility] [address], [place] [phone]   Planned Parenthood    [phone]   [facility]    [phone]   Community [facility] and [facility]  [address], [place] Phone:  [phone], Fax:  [phone] Hours of Operation:  9 am - 6 pm, M-F.  Also accepts Medicaid/Medicare and self-pay.  [facility]  [address], [place] Phone: [phone], Fax: [phone]. Hours of Operation:  8:30 am - 5:30 pm, M-F.  Also accepts Medicaid and self-pay.  [facility] [place] [address], [place] Phone: [phone]   [facility] [address] Natasha Bence [place], [place] [phone], Ext. [phone] Mondays & Thursdays: 7-9 AM.  First 15 patients are seen on a first come, first serve basis.    Medicaid-accepting [facility] Providers:  Organization         Address  Phone   Notes  [facility] [address], [place] [phone] Also accepts self-pay patients.  [facility] [address] Laurell Josephs [place], [place]  [phone]   [facility] [address], [place] [phone]   [facility] [address] [place] [phone]     Renaye Rakers [address], [place]   [phone] Only accepts [place] Access [place] patients after they have their name applied to their card.   Self-Pay (no insurance) in [facility]:  Organization         Address  Phone   Notes  Sickle Cell Patients, [facility] [address] [place], [place] [phone]   [facility] [address] [place], [place] [phone]   [facility] [place]  [address], [place] [phone]   [facility]/Dr. Osei-Bonsu  [address] [place] or [address], [place] [phone] Phone number for both [place] and [place] locations is the same.  Urgent Medical and [facility] [address] [place] Otto [phone]  [facility] [address], [place] or [address] [phone]   [facility] [address] [place], [place] [phone], phone; [phone], fax Sees patients 1st and 3rd Saturday of every month.  Must not qualify for public or private insurance (i.e. Medicaid, Medicare, [place] Health Choice, Veterans' Benefits)  Household income should be no more than 200% of the poverty level The clinic cannot treat you if you are pregnant or think you are pregnant  Sexually transmitted diseases are not treated at the clinic.    Dental Care: Organization         Address  Phone  Notes  [facility] [address] [place], [place] [phone] Accepts children up to age 69 who are enrolled in [place] or [place] Health Choice; pregnant women with a Medicaid card; and children who have applied for Medicaid or [place] Health Choice, but were declined, whose parents can pay a reduced fee at time of service.  [facility] of [facility]  [address], [place] [phone] Accepts children  up to age 33 who are enrolled in [place] or [place] Health Choice; pregnant women with a Medicaid card; and children who have applied for Medicaid or [place] Health Choice, but were declined, whose parents can pay a reduced fee at time of service.  [facility]  [address] [place], [place] [phone] Patients are seen by appointment only. Walk-ins are not accepted. [facility] will see patients 49 years of age and older. Monday - Tuesday (8am-5pm) Most Wednesdays (8:30-5pm) $30 per visit, cash only  [facility] Adult [facility]  [address], [facility] [phone] Patients are seen by appointment only. Walk-ins are not accepted. [facility] will see patients 25 years of age and older. One Wednesday Evening (Monthly: Volunteer Based).  $30 per visit, cash only  [employer]  [phone] for adults; Children under age 72, call Graduate Pediatric Dentistry at [phone]. Children aged 35-14, please call [phone] to request a pediatric application.  Dental services are provided in all areas of dental care including fillings, crowns and bridges, complete and partial dentures, implants, gum treatment, root canals, and extractions. Preventive care is also provided. Treatment is provided to both adults and children. Patients are selected via a lottery and there is often a waiting list.   [facility] [address], [place]  [phone] [email or web address]   [facility] [address] [place], [place] [phone], Ext. [phone] Second and Fourth Thursday of each month, opens at 6:30 AM; Clinic ends at 9 AM.  Patients are seen on a first-come first-served basis, and a limited number are seen during each clinic.   [facility]  [address] Ether Griffins [place], [place] [phone]   Eligibility Requirements You must have lived in [place], [place], or [place] counties for at least the last  three months.   You cannot be eligible for state or federal sponsored [place], including [employer], [place], or [employer].   You generally cannot be eligible for healthcare insurance through your employer.    How to apply: Eligibility screenings are held every Tuesday and Wednesday afternoon from 1:00 pm until 4:00 pm. You do not need an appointment for the interview!  [facility] [address] [place], [place] [phone]  [facility]  [phone]   [facility]  [phone]   [facility] Health Department  [phone]    Behavioral Health Resources in the Community: Intensive Outpatient Programs Organization         Address  Phone  Notes  [facility] [address], [place], [place] [phone]   [facility] [address], [place], [place] [phone]   [facility] [address] [place], [place]  [phone]   [facility] Mental Health [address]  [place], [place] [phone] or [phone]   Substance Abuse Resources Organization         Address  Phone  Notes  [facility] Drug Services  [phone]   [facility]  [phone]   [facility] [place]  [phone]   Floydene Flock  [phone]   Residential & Outpatient Substance Abuse Program  [phone]   Psychological Services Organization         Address  Phone  Notes  [facility] Behavioral Health  [phone]   [facility] Services  [phone]   [facility] Mental Health [address] [place] [phone] or [phone]    Mobile Crisis Teams Organization         Address  Phone  Notes  [facility], Mobile Crisis Care Unit  [phone]   [facility]  [address] [place], [place] [phone]   Doristine Locks [address] [place] [phone]     Self-Help/Support Groups Organization         Address  Phone             Notes  [facility] [place] - variety of support groups  336- I7437963 Call for more information  Narcotics Anonymous (NA), [facility] [address], [employer] [place]  2 meetings at this location   Statistician         Address  Phone  Notes  [facility] [address],    [place]  [phone]   [facility]  [address] [place] [address], [place], [place] [phone]   [facility] [address] [place], [place] [phone] Admissions: 8am-3pm M-F  [facility] [address],    [place], [place] [phone]   [facility] [address] [place], [place], [place] [phone]   [facility] [address],  [place]afael [place], [place] [phone]   [facility] - Intensive Outpatient [address] Laurell Josephs [address], [place], [place] [phone]   [facility] (Addiction [facility] [address] [place]  [place], [place] [phone] or [phone]   [facility] ([facility]) [address], [place], [place] [phone] Accepts Medicaid  [facility] [address]  [place] [phone] Substance Abuse/Addiction Treatment   [facility] Organization         Address  Phone  Notes  [facility]  [phone]   Angie Fava, PhD [address] Ervin Knack [place], [place]   [phone] or [phone]   [facility] Behavioral   [address] [place], [place] [phone]   [facility] [address] [place], [place] [phone] Insurance/Medicaid/sponsorship through [employer] and Families [address]  [place], [place] [phone] [place], [place] [phone]    Dr. Adele Schilder  [phone]   Free  [facility] [place] Dept. 1) [address] [place] 2) [place] 3)  [place] [address], [place] [phone]  [phone]   [place] [phone] or [phone] (After Hours)

## 2016-03-29 ENCOUNTER — Encounter (HOSPITAL_COMMUNITY): Payer: Self-pay | Admitting: *Deleted

## 2016-03-29 ENCOUNTER — Emergency Department (HOSPITAL_COMMUNITY)
Admission: EM | Admit: 2016-03-29 | Discharge: 2016-03-29 | Disposition: A | Discharge status: Home / Self Care | Payer: Medicaid Other | Attending: Emergency Medicine | Admitting: Emergency Medicine

## 2016-03-29 DIAGNOSIS — Z791 Long term (current) use of non-steroidal anti-inflammatories (NSAID): Secondary | ICD-10-CM | POA: Insufficient documentation

## 2016-03-29 DIAGNOSIS — K002 Abnormalities of size and form of teeth: Secondary | ICD-10-CM | POA: Insufficient documentation

## 2016-03-29 DIAGNOSIS — Z7982 Long term (current) use of aspirin: Secondary | ICD-10-CM | POA: Insufficient documentation

## 2016-03-29 DIAGNOSIS — E119 Type 2 diabetes mellitus without complications: Secondary | ICD-10-CM | POA: Insufficient documentation

## 2016-03-29 DIAGNOSIS — K029 Dental caries, unspecified: Secondary | ICD-10-CM

## 2016-03-29 DIAGNOSIS — K219 Gastro-esophageal reflux disease without esophagitis: Secondary | ICD-10-CM | POA: Insufficient documentation

## 2016-03-29 MED ORDER — HYDROCODONE-ACETAMINOPHEN 5-325 MG PO TABS
1.0000 | ORAL_TABLET | Freq: Once | ORAL | Status: AC
  Administered 2016-03-29: 1 via ORAL
  Filled 2016-03-29: qty 1

## 2016-03-29 MED ORDER — HYDROCODONE-ACETAMINOPHEN 5-325 MG PO TABS: 2.0000 | ORAL_TABLET | ORAL | Status: DC | PRN

## 2016-03-29 MED ORDER — PENICILLIN V POTASSIUM 500 MG PO TABS: 500.0000 mg | ORAL_TABLET | Freq: Four times a day (QID) | ORAL | Status: AC

## 2016-03-29 NOTE — ED Notes (Signed)
Pt reports dental pain increased a week ago . Pt reports she was unable to sleep last night due to pain.

## 2016-03-29 NOTE — ED Provider Notes (Signed)
CSN: [id number]     Arrival date & time 03/29/16  1191 History   First MD Initiated Contact with Patient 03/29/16 [phone]     Chief Complaint  Patient presents with  . Dental Pain     (Consider location/radiation/quality/duration/timing/severity/associated sxs/prior Treatment) HPI   Audrey [name] is a 30 year old female with a history of DM who presents to the ED complaining of dental pain. Patient states that she developed a hole in her right upper molar a couple of months ago which is been causing her pain ever since. Patient suspects hole is due to a cavity. This pain has progressively increased over the last 3 days. Patient states she has pain with eating and is unable to sleep due to the pain. Denies dysphagia, facial swelling, fever, chills. Patient states she is aware this to sneeze to, but she has been unable to do so due to lack of insurance.   Past Medical History  Diagnosis Date  . Preeclampsia   . Diabetes mellitus without complication (HCC)   . Pregnancy induced hypertension   . Gestational diabetes   . Fetal demise 05/19/2013  . S/P cesarean section 05/20/2013  . GERD (gastroesophageal reflux disease)     with pregnancy  . Headache     migraines  . Hemorrhage after delivery of fetus 08/12/2006  . S/P cesarean section 11/18/2014   Past Surgical History  Procedure Laterality Date  . Cesarean section    . Toenail excision    . Wisdom tooth extraction    . Cesarean section N/A 05/19/2013    Procedure: CESAREAN SECTION;  Surgeon: Sherron Monday, MD;  Location: [facility];  Service: Obstetrics;  Laterality: N/A;  . Cesarean section with bilateral tubal ligation Bilateral 11/17/2014    Procedure: CESAREAN SECTION WITH BILATERAL TUBAL LIGATION;  Surgeon: Sherian Rein, MD;  Location: [facility];  Service: Obstetrics;  Laterality: Bilateral;   Family History  Problem Relation Age of Onset  . Asthma Other   . Hyperlipidemia Other   . Obesity Other    Social History  Substance  Use Topics  . Smoking status: Never Smoker   . Smokeless tobacco: Never Used  . Alcohol Use: No   OB History    Gravida Para Term Preterm AB TAB SAB Ectopic Multiple Living   0 0 0 0 0 0 2     Review of Systems  All other systems reviewed and are negative.     Allergies  Review of patient's allergies indicates no known allergies.  Home Medications   Prior to Admission medications   Medication Sig Start Date End Date Taking? Authorizing Provider  acetaminophen (TYLENOL) 500 MG tablet Take 1,000 mg by mouth every 6 (six) hours as needed for headache.    Historical Provider, MD  aspirin-acetaminophen-caffeine (EXCEDRIN MIGRAINE) [phone] MG tablet Take 2 tablets by mouth every 8 (eight) hours as needed for headache.    Historical Provider, MD  ibuprofen (ADVIL,MOTRIN) 800 MG tablet Take 1 tablet (800 mg total) by mouth every 8 (eight) hours. 11/19/14   Sherian Rein, MD  ibuprofen (ADVIL,MOTRIN) 800 MG tablet Take 1 tablet (800 mg total) by mouth every 8 (eight) hours as needed (pain. Take with food). 10/07/15   Doug Sou, MD  omeprazole (PRILOSEC) 20 MG capsule Take 20 mg by mouth daily as needed (heartburn).     Historical Provider, MD   BP 125/77 mmHg  Pulse 71  Temp(Src) 97.5 F (36.4 C) (Oral)  Resp 20  SpO2  99%  LMP 03/12/2016  Breastfeeding? No Physical Exam  Constitutional: She is oriented to person, place, and time. She appears well-developed and well-nourished. No distress.  HENT:  Head: Normocephalic and atraumatic.  Mouth/Throat: Oropharynx is clear and moist. No oral lesions. No trismus in the jaw. Abnormal dentition. Dental caries present. No dental abscesses.    Eyes: Conjunctivae are normal. Right eye exhibits no discharge. Left eye exhibits no discharge. No scleral icterus.  Neck: Neck supple.  Cardiovascular: Normal rate.   Pulmonary/Chest: Effort normal.  Lymphadenopathy:    She has no cervical adenopathy.  Neurological: She is  alert and oriented to person, place, and time. Coordination normal.  Skin: Skin is warm and dry. No rash noted. She is not diaphoretic. No erythema. No pallor.  Psychiatric: She has a normal mood and affect. Her behavior is normal.  Nursing note and vitals reviewed.   ED Course  Procedures (including critical care time) Labs Review Labs Reviewed - No data to display  Imaging Review No results found. I have personally reviewed and evaluated these images and lab results as part of my medical decision-making.   EKG Interpretation None      MDM   Final diagnoses:  Dental caries    Patient with toothache, significant dental caried. No gross abscess. Exam unconcerning for Ludwig's angina or spread of infection.  Will treat with penicillin and pain medicine. Urged patient to follow-up with dentist. Resource guide given. Return precautions outlined in patient discharge instructions.       Lester KinsmanSamantha Tripp RussellDowless, PA-C 03/29/16 [id number]  Nelva Nayobert Beaton, MD 03/29/16 [phone]

## 2016-03-29 NOTE — ED Notes (Signed)
Declined W/C at D/C and was escorted to lobby by RN. 

## 2016-03-29 NOTE — Discharge Instructions (Signed)
Dental Caries °Dental caries (also called tooth decay) is the most common oral disease. It can occur at any age but is more common in children and young adults.  °HOW DENTAL CARIES DEVELOPS  °The process of decay begins when bacteria and foods (particularly sugars and starches) combine in your mouth to produce plaque. Plaque is a substance that sticks to the hard, outer surface of a tooth (enamel). The bacteria in plaque produce acids that attack enamel. These acids may also attack the root surface of a tooth (cementum) if it is exposed. Repeated attacks dissolve these surfaces and create holes in the tooth (cavities). If left untreated, the acids destroy the other layers of the tooth.  °RISK FACTORS °· Frequent sipping of sugary beverages.   °· Frequent snacking on sugary and starchy foods, especially those that easily get stuck in the teeth.   °· Poor oral hygiene.   °· Dry mouth.   °· Substance abuse such as methamphetamine abuse.   °· Broken or poor-fitting dental restorations.   °· Eating disorders.   °· Gastroesophageal reflux disease (GERD).   °· Certain radiation treatments to the head and neck. °SYMPTOMS °In the early stages of dental caries, symptoms are seldom present. Sometimes white, chalky areas may be seen on the enamel or other tooth layers. In later stages, symptoms may include: °· Pits and holes on the enamel. °· Toothache after sweet, hot, or cold foods or drinks are consumed. °· Pain around the tooth. °· Swelling around the tooth. °DIAGNOSIS  °Most of the time, dental caries is detected during a regular dental checkup. A diagnosis is made after a thorough medical and dental history is taken and the surfaces of your teeth are checked for signs of dental caries. Sometimes special instruments, such as lasers, are used to check for dental caries. Dental X-ray exams may be taken so that areas not visible to the eye (such as between the contact areas of the teeth) can be checked for cavities.    °TREATMENT  °If dental caries is in its early stages, it may be reversed with a fluoride treatment or an application of a remineralizing agent at the dental office. Thorough brushing and flossing at home is needed to aid these treatments. If it is in its later stages, treatment depends on the location and extent of tooth destruction:  °· If a small area of the tooth has been destroyed, the destroyed area will be removed and cavities will be filled with a material such as gold, silver amalgam, or composite resin.   °· If a large area of the tooth has been destroyed, the destroyed area will be removed and a cap (crown) will be fitted over the remaining tooth structure.   °· If the center part of the tooth (pulp) is affected, a procedure called a root canal will be needed before a filling or crown can be placed.   °· If most of the tooth has been destroyed, the tooth may need to be pulled (extracted). °HOME CARE INSTRUCTIONS °You can prevent, stop, or reverse dental caries at home by practicing good oral hygiene. Good oral hygiene includes: °· Thoroughly cleaning your teeth at least twice a day with a toothbrush and dental floss.   °· Using a fluoride toothpaste. A fluoride mouth rinse may also be used if recommended by your dentist or health care provider.   °· Restricting the amount of sugary and starchy foods and sugary liquids you consume.   °· Avoiding frequent snacking on these foods and sipping of these liquids.   °· Keeping regular visits with   a dentist for checkups and cleanings. PREVENTION   Practice good oral hygiene.  Consider a dental sealant. A dental sealant is a coating material that is applied by your dentist to the pits and grooves of teeth. The sealant prevents food from being trapped in them. It may protect the teeth for several years.  Ask about fluoride supplements if you live in a community without fluorinated water or with water that has a low fluoride content. Use fluoride supplements  as directed by your dentist or health care provider.  Allow fluoride varnish applications to teeth if directed by your dentist or health care provider.   This information is not intended to replace advice given to you by your health care provider. Make sure you discuss any questions you have with your health care provider.   Document Released: 09/06/2002 Document Revised: 01/05/2015 Document Reviewed: 12/17/2012 Elsevier Interactive Patient Education 2016 Elsevier Inc.  Dental Pain Dental pain may be caused by many things, including:  Tooth decay (cavities or caries). Cavities expose the nerve of your tooth to air and hot or cold temperatures. This can cause pain or discomfort.  Abscess or infection. A dental abscess is a collection of infected pus from a bacterial infection in the inner part of the tooth (pulp). It usually occurs at the end of the tooth's root.  Injury.  An unknown reason (idiopathic). Your pain may be mild or severe. It may only occur when:  You are chewing.  You are exposed to hot or cold temperature.  You are eating or drinking sugary foods or beverages, such as soda or candy. Your pain may also be constant. HOME CARE INSTRUCTIONS Watch your dental pain for any changes. The following actions may help to lessen any discomfort that you are feeling:  Take medicines only as directed by your dentist.  If you were prescribed an antibiotic medicine, finish all of it even if you start to feel better.  Keep all follow-up visits as directed by your dentist. This is important.  Do not apply heat to the outside of your face.  Rinse your mouth or gargle with salt water if directed by your dentist. This helps with pain and swelling.  You can make salt water by adding  tsp of salt to 1 cup of warm water.  Apply ice to the painful area of your face:  Put ice in a plastic bag.  Place a towel between your skin and the bag.  Leave the ice on for 20 minutes, 2-3  times per day.  Avoid foods or drinks that cause you pain, such as:  Very hot or very cold foods or drinks.  Sweet or sugary foods or drinks. SEEK MEDICAL CARE IF:  Your pain is not controlled with medicines.  Your symptoms are worse.  You have new symptoms. SEEK IMMEDIATE MEDICAL CARE IF:  You are unable to open your mouth.  You are having trouble breathing or swallowing.  You have a fever.  Your face, neck, or jaw is swollen.   This information is not intended to replace advice given to you by your health care provider. Make sure you discuss any questions you have with your health care provider.   Document Released: 12/15/2005 Document Revised: 05/01/2015 Document Reviewed: 12/11/2014 Elsevier Interactive Patient Education 2016 [employer] Resource Guide Dental The United [employer] 211 is a great source of information about community services available.  Access by dialing 2-1-1 from anywhere in [place], or by website -  PooledIncome.plwww.nc211.org.   Other Local Resources (Updated 12/2015)  Dental  Care   Services    Phone Number and Address  Cost  [facility] For children 560 - 31 years of age:   Cleaning  Tooth brushing/flossing instruction  Sealants, fillings, crowns  Extractions  Emergency treatment  [phone] [address] [place], [place] [address] Charges based on family income.  Medicaid and some insurance plans accepted.     [facility]  Cleaning  Sealants, fillings, crowns  Extractions  Emergency treatment [phone] [address] [place], [place]  Pregnant women 31 years of age or older with a Medicaid card  [facility] [place]  Cleaning  Sealants, fillings, crowns  Extractions  Emergency treatment [phone] [address] [place], [place] Pregnant women 31 years of age or older with a Medicaid card   [facility] For children 80 - 31 years of age:   Cleaning  Tooth brushing/flossing instruction  Sealants, fillings, crowns  Extractions  Emergency treatment Limited orthodontic services for patients with Medicaid [phone] [address] [place], [place] [address] Medicaid and [facility] Health Choice cover for children up to age 31 and pregnant women.  Parents of children up to age 31 without Medicaid pay a reduced fee at time of service.  [facility] [place] For children 580 - 31 years of age:   Cleaning  Tooth brushing/flossing instruction  Sealants, fillings, crowns  Extractions  Emergency treatment Limited orthodontic services for patients with Medicaid [phone] [address] [place], [place].  Medicaid and [place] Health Choice cover for children up to age 31 and pregnant women.  Parents of children up to age 31 without Medicaid pay a reduced fee.  [facility]  Cleaning  Sealants, fillings, crowns  Extractions  Hours: Tuesdays and Thursdays, 4:15 - 8 pm [phone] [address] [place], [place] [address] Services free of charge to [facility] residents ages 18-64 who do not have health insurance, Medicare, [place], or [place] benefits and fall within federal poverty guidelines  [facility]    Provides dental care in addition to primary medical care, nutritional counseling, and pharmacy:  Nurse, mental healthCleaning  Sealants, fillings, crowns  Extractions                  [phone] [facility], [address] [place], [place]  [phone] [facility], [address] [place], [place]  [phone] [facility] [place], [place]  [phone] [facility], [address] [place], [place]  [phone] [facility] [address] [place], [place] Accepts [place], [place], most insurance.  Also provides services available to all with fees adjusted based on ability to pay.    [facility]  Cleaning  Tooth brushing/flossing instruction  Sealants, fillings, crowns  Extractions  Emergency treatment Hours: Tuesdays, Thursdays, and Fridays from 8 am to 5 pm by appointment only. [phone] [address] [place], [place] [address] [facility] residents with Medicaid (depending on eligibility) and children with [facility] Health Choice - call for more information.  [facility]  Extractions only  Hours: 2nd and 4th Thursday of each month from 6:30 am - 9 am.   [phone] [address] [place],  [place] [address] Ages 18 and older only.  Patients are seen on a first come, first served basis.  [employer]  [employer]  Extractions  Orthodontics  Endodontics  Implants/Crowns/Bridges  Complete and partial dentures [phone] [place], [place] Patients must complete an application for services.  There is often a waiting list.    Follow-up with dentist as soon as possible for consultation and likely tooth extraction. Take antibiotics as prescribed. Return to the emergency department a few experience severe worsening of her symptoms, fever, chills, difficulty swallowing, inability to open your mouth, facial swelling.

## 2016-10-03 ENCOUNTER — Ambulatory Visit: Payer: Medicaid Other | Admitting: Neurology

## 2016-10-03 ENCOUNTER — Telehealth: Payer: Self-pay

## 2016-10-03 NOTE — Telephone Encounter (Signed)
Pt no-showed her new patient appt this morning. 

## 2016-10-06 ENCOUNTER — Encounter: Payer: Self-pay | Admitting: Neurology

## 2017-01-23 ENCOUNTER — Encounter: Payer: Self-pay | Admitting: Neurology

## 2017-01-23 ENCOUNTER — Ambulatory Visit (INDEPENDENT_AMBULATORY_CARE_PROVIDER_SITE_OTHER): Payer: Medicaid Other | Admitting: Neurology

## 2017-01-23 ENCOUNTER — Other Ambulatory Visit: Payer: Self-pay

## 2017-01-23 DIAGNOSIS — G43019 Migraine without aura, intractable, without status migrainosus: Secondary | ICD-10-CM | POA: Insufficient documentation

## 2017-01-23 HISTORY — DX: Migraine without aura, intractable, without status migrainosus: G43.019

## 2017-01-23 HISTORY — DX: Morbid (severe) obesity due to excess calories: E66.01

## 2017-01-23 MED ORDER — TOPIRAMATE 25 MG PO TABS: 75.0000 mg | ORAL_TABLET | Freq: Every day | ORAL | 1 refills | Status: DC

## 2017-01-23 MED ORDER — RIZATRIPTAN BENZOATE 10 MG PO TBDP: 10.0000 mg | ORAL_TABLET | Freq: Three times a day (TID) | ORAL | 3 refills | Status: DC | PRN

## 2017-01-23 NOTE — Progress Notes (Signed)
Reason for visit: Migraine headache  Referring physician: [facility]  Audrey [name] is a 31 y.o. female  History of present illness:  Audrey [name] is a 31 year old right-handed black female with a history of headaches that began around age 1. The patient has had frequent headaches throughout her life, the headaches continue at a frequency of about 3 or 4 a week. The patient indicates that the headaches may begin on the right or left temporal area and persist for 2 hours or up to a full day. Occasionally, the headache may last 2-3 days. The headaches are associated with photophobia and phonophobia, and some dizziness. She may have spots in front of the eyes, but no other visual disturbances. She denies any nausea or vomiting. The patient has taken Excedrin Migraine for the headache, but her primary care physician told her that she needed to reduce the amount of this medication as she was taking too much. The patient was started on low-dose Topamax in September 2017, she is only taking 25 mg at night without much benefit. She is otherwise tolerating the medication. She denies any family history of migraine. She reports no other symptoms with the headache such as numbness, weakness, cognitive slowing, or syncope. She denies neck pain or neck stiffness. She denies any allergy symptoms or sinus drainage. She is sent to this office for an evaluation. She denies any known activating factors for the headache.  Past Medical History:  Diagnosis Date  . Diabetes mellitus without complication (HCC)   . Fetal demise 05/19/2013  . GERD (gastroesophageal reflux disease)    with pregnancy  . Gestational diabetes   . Headache    migraines  . Hemorrhage after delivery of fetus 08/12/2006  . Preeclampsia   . Pregnancy induced hypertension   . S/P cesarean section 05/20/2013  . S/P cesarean section 11/18/2014    Past Surgical History:  Procedure Laterality Date  . CESAREAN SECTION    . CESAREAN  SECTION N/A 05/19/2013   Procedure: CESAREAN SECTION;  Surgeon: Audrey Monday, MD;  Location: [facility];  Service: Obstetrics;  Laterality: N/A;  . CESAREAN SECTION WITH BILATERAL TUBAL LIGATION Bilateral 11/17/2014   Procedure: CESAREAN SECTION WITH BILATERAL TUBAL LIGATION;  Surgeon: Audrey Rein, MD;  Location: [facility];  Service: Obstetrics;  Laterality: Bilateral;  . TOENAIL EXCISION    . WISDOM TOOTH EXTRACTION      Family History  Problem Relation Age of Onset  . Adopted: Yes  . Diabetes Mother   . Asthma Other   . Hyperlipidemia Other   . Obesity Other     Social history:  reports that she has never smoked. She has never used smokeless tobacco. She reports that she does not drink alcohol or use drugs.  Medications:  Prior to Admission medications   Medication Sig Start Date End Date Taking? Authorizing Provider  acetaminophen (TYLENOL) 500 MG tablet Take 1,000 mg by mouth every 6 (six) hours as needed for headache.    Historical Provider, MD  aspirin-acetaminophen-caffeine (EXCEDRIN MIGRAINE) [phone] MG tablet Take 2 tablets by mouth every 8 (eight) hours as needed for headache.    Historical Provider, MD  HYDROcodone-acetaminophen (NORCO/VICODIN) 5-325 MG tablet Take 2 tablets by mouth every 4 (four) hours as needed. Patient not taking: Reported on 01/23/2017 03/29/16   Audrey Tripp Dowless, PA-C  ibuprofen (ADVIL,MOTRIN) 800 MG tablet Take 1 tablet (800 mg total) by mouth every 8 (eight) hours as needed (pain. Take with food). Patient not taking: Reported  on 01/23/2017 10/07/15   Audrey SouSam Jacubowitz, MD  omeprazole (PRILOSEC) 20 MG capsule Take 20 mg by mouth daily as needed (heartburn).     Historical Provider, MD  rizatriptan (MAXALT-MLT) 10 MG disintegrating tablet Take 1 tablet (10 mg total) by mouth 3 (three) times daily as needed for migraine. 01/23/17   Audrey Spanielharles K Clarabell Matsuoka, MD  topiramate (TOPAMAX) 25 MG tablet Take 3 tablets (75 mg total) by mouth at bedtime. 01/23/17   Audrey Spanielharles  K Joscelyne Renville, MD      Allergies  Allergen Reactions  . Imitrex [Sumatriptan]     Nausea    ROS:  Out of a complete 14 system review of symptoms, the patient complains only of the following symptoms, and all other reviewed systems are negative.  Headache  Blood pressure 134/82, pulse 71, resp. rate 18, height 5\' 7"  (1.702 m), weight 261 lb (118.4 kg).  Physical Exam  General: The patient is alert and cooperative at the time of the examination. The patient is markedly obese.  Eyes: Pupils are equal, round, and reactive to light. Discs are flat bilaterally.  Neck: The neck is supple, no carotid bruits are noted.  Respiratory: The respiratory examination is clear.  Cardiovascular: The cardiovascular examination reveals a regular rate and rhythm, no obvious murmurs or rubs are noted.   Neuromuscular: Range of movement of the cervical spine is full. The patient has no crepitus in the temporomandibular joints on either side.  Skin: Extremities are without significant edema.  Neurologic Exam  Mental status: The patient is alert and oriented x 3 at the time of the examination. The patient has apparent normal recent and remote memory, with an apparently normal attention span and concentration ability.  Cranial nerves: Facial symmetry is present. There is good sensation of the face to pinprick and soft touch bilaterally. The strength of the facial muscles and the muscles to head turning and shoulder shrug are normal bilaterally. Speech is well enunciated, no aphasia or dysarthria is noted. Extraocular movements are full. Visual fields are full. The tongue is midline, and the patient has symmetric elevation of the soft palate. No obvious hearing deficits are noted.  Motor: The motor testing reveals 5 over 5 strength of all 4 extremities. Good symmetric motor tone is noted throughout.  Sensory: Sensory testing is intact to pinprick, soft touch, vibration sensation, and position sense on all  4 extremities. No evidence of extinction is noted.  Coordination: Cerebellar testing reveals good finger-nose-finger and heel-to-shin bilaterally.  Gait and station: Gait is normal. Tandem gait is normal. Romberg is negative. No drift is seen.  Reflexes: Deep tendon reflexes are symmetric and normal bilaterally. Toes are downgoing bilaterally.   Assessment/Plan:  1. Intractable migraine headache  The patient will need to increase the Topamax dose, she is not getting benefit on a very low dose. The patient will work up to 75 mg at night, if she is still not getting good benefit, she will call our office for dose adjustments. She will be given Maxalt to take if needed for the headache, Imitrex resulted in nausea. The patient will follow-up in 3 months.  Marlan Palau. Keith Caine Barfield MD 01/23/2017 10:06 AM  [facility] [address] [place], [place] [address]  Phone [phone] Fax [phone]

## 2017-01-23 NOTE — Patient Instructions (Addendum)
   With the Topamax 25 mg begin 2 at night for 1 week, then take 3 at night. If no improvement in the headache within 2 weeks is noted, call our office and we will increase the dose.  Topamax (topiramate) is a seizure medication that has an FDA approval for seizures and for migraine headache. Potential side effects of this medication include weight loss, cognitive slowing, tingling in the fingers and toes, and carbonated drinks will taste bad. If any significant side effects are noted on this drug, please contact our office.

## 2017-04-23 ENCOUNTER — Ambulatory Visit: Payer: Medicaid Other | Admitting: Adult Health

## 2019-01-26 ENCOUNTER — Other Ambulatory Visit: Payer: Self-pay

## 2019-01-26 ENCOUNTER — Encounter: Payer: Self-pay | Admitting: Internal Medicine

## 2019-01-26 ENCOUNTER — Ambulatory Visit (INDEPENDENT_AMBULATORY_CARE_PROVIDER_SITE_OTHER): Payer: No Typology Code available for payment source | Admitting: Internal Medicine

## 2019-01-26 VITALS — BP 132/82 | HR 74 | Temp 98.7°F | Ht 69.0 in | Wt 282.8 lb

## 2019-01-26 DIAGNOSIS — Z8632 Personal history of gestational diabetes: Secondary | ICD-10-CM | POA: Diagnosis not present

## 2019-01-26 DIAGNOSIS — Z6841 Body Mass Index (BMI) 40.0 and over, adult: Secondary | ICD-10-CM

## 2019-01-26 DIAGNOSIS — N6310 Unspecified lump in the right breast, unspecified quadrant: Secondary | ICD-10-CM | POA: Diagnosis not present

## 2019-01-26 DIAGNOSIS — M25561 Pain in right knee: Secondary | ICD-10-CM

## 2019-01-26 DIAGNOSIS — K2960 Other gastritis without bleeding: Secondary | ICD-10-CM | POA: Insufficient documentation

## 2019-01-26 DIAGNOSIS — G8929 Other chronic pain: Secondary | ICD-10-CM | POA: Diagnosis not present

## 2019-01-26 DIAGNOSIS — I10 Essential (primary) hypertension: Secondary | ICD-10-CM | POA: Insufficient documentation

## 2019-01-26 DIAGNOSIS — N6325 Unspecified lump in the left breast, overlapping quadrants: Secondary | ICD-10-CM

## 2019-01-26 DIAGNOSIS — N6320 Unspecified lump in the left breast, unspecified quadrant: Principal | ICD-10-CM

## 2019-01-26 DIAGNOSIS — K219 Gastro-esophageal reflux disease without esophagitis: Secondary | ICD-10-CM | POA: Insufficient documentation

## 2019-01-26 LAB — POCT URINALYSIS DIPSTICK
BILIRUBIN UA: NEGATIVE
Blood, UA: NEGATIVE
Glucose, UA: NEGATIVE
KETONES UA: NEGATIVE
Leukocytes, UA: NEGATIVE
NITRITE UA: NEGATIVE
PH UA: 7 (ref 5.0–8.0)
PROTEIN UA: NEGATIVE
Spec Grav, UA: 1.02 (ref 1.010–1.025)
UROBILINOGEN UA: 0.2 U/dL

## 2019-01-26 LAB — POCT UA - MICROALBUMIN
ALBUMIN/CREATININE RATIO, URINE, POC: 30
Creatinine, POC: 300 mg/dL
Microalbumin Ur, POC: 30 mg/L

## 2019-01-26 NOTE — Progress Notes (Addendum)
Subjective:     Patient ID: Audrey [name] , female    DOB: May 04, 1986 , 33 y.o.   MRN: [id number]   Chief Complaint  Patient presents with  . New Patient (Initial Visit)    HPI Pt is here to establish care with our practice. She is a Marine scientist. Used to see a PCP for her pap's and breast xms.  1- Wants HGBA1C. Tries to make good choices at home when she eats and feeds her kids. Does not have time to go to a wt loss program. Has maintained her wt since her last child.  2- R knee pain anteriorly and on the back of it, worse when she drives long distance. Also when walking a lot on frat surface and going down steps. At times  has felt it gives away and never injured it before.  3- R breast has small lump in the R medial region. This appeared in the past month, but prior to that, at least 2 months prior she has had intermittent  burning of her breasts. And last week was worse of both of them. Did not noticed any swelling, rashes or changes in color of her breast. While at home she could not stand there braw and took it off, but putting it back on caused worse pain.   Past Medical History:  Diagnosis Date  . Common migraine with intractable migraine 01/23/2017  . Diabetes mellitus without complication (HCC)   . Fetal demise 05/19/2013  . GERD (gastroesophageal reflux disease)    with pregnancy  . Gestational diabetes   . Headache    migraines  . Hemorrhage after delivery of fetus 08/12/2006  . Morbidly obese (HCC) 01/23/2017  . Preeclampsia   . Pregnancy induced hypertension   . S/P cesarean section 05/20/2013  . S/P cesarean section 11/18/2014     Family History  Adopted: Yes  Problem Relation Age of Onset  . Diabetes Mother   . Asthma Other   . Hyperlipidemia Other   . Obesity Other      Current Outpatient Medications:  .  acetaminophen (TYLENOL) 500 MG tablet, Take 1,000 mg by mouth every 6 (six) hours as needed for headache., Disp: , Rfl:  .   aspirin-acetaminophen-caffeine (EXCEDRIN MIGRAINE) 250-250-65 MG tablet, Take 2 tablets by mouth every 8 (eight) hours as needed for headache., Disp: , Rfl:  .  HYDROcodone-acetaminophen (NORCO/VICODIN) 5-325 MG tablet, Take 2 tablets by mouth every 4 (four) hours as needed. (Patient not taking: Reported on 01/23/2017), Disp: 4 tablet, Rfl: 0 .  ibuprofen (ADVIL,MOTRIN) 800 MG tablet, Take 1 tablet (800 mg total) by mouth every 8 (eight) hours as needed (pain. Take with food). (Patient not taking: Reported on 01/23/2017), Disp: 21 tablet, Rfl: 0 .  omeprazole (PRILOSEC) 20 MG capsule, Take 20 mg by mouth daily as needed (heartburn). , Disp: , Rfl:  .  rizatriptan (MAXALT-MLT) 10 MG disintegrating tablet, Take 1 tablet (10 mg total) by mouth 3 (three) times daily as needed for migraine. (Patient not taking: Reported on 01/26/2019), Disp: 9 tablet, Rfl: 3 .  topiramate (TOPAMAX) 25 MG tablet, Take 3 tablets (75 mg total) by mouth at bedtime. (Patient not taking: Reported on 01/26/2019), Disp: 90 tablet, Rfl: 1   Allergies  Allergen Reactions  . Imitrex [Sumatriptan]     Nausea     Review of Systems  Constitutional: Negative for chills, fever and unexpected weight change.  HENT: Positive for congestion.  At night time. Used to use a humedifier.   Respiratory: Negative for chest tightness and shortness of breath.   Cardiovascular: Negative for leg swelling.  Gastrointestinal: Negative for constipation, diarrhea, nausea and vomiting.  Endocrine: Positive for polyuria. Negative for polydipsia and polyphagia.  Genitourinary:       R breast lump x 1 month, burning breasts x 2 months  Musculoskeletal: Positive for arthralgias and gait problem. Negative for joint swelling and myalgias.       R knee pain as noted in HPI  Skin: Negative for color change, pallor, rash and wound.  Neurological: Negative for numbness.     Today's Vitals   01/26/19 1154  BP: 132/82  Pulse: 74  Temp: 98.7 F (37.1  C)  TempSrc: Oral  SpO2: 98%  Weight: 282 lb 12.8 oz (128.3 kg)  Height: 5\' 9"  (1.753 m)   Body mass index is 41.76 kg/m.   Objective:  Physical Exam Vitals signs and nursing note reviewed.  Constitutional:      General: She is not in acute distress.    Appearance: She is obese. She is not toxic-appearing.  HENT:     Head: Normocephalic.     Right Ear: External ear normal.     Left Ear: External ear normal.     Nose: Nose normal.     Mouth/Throat:     Mouth: Mucous membranes are moist.  Eyes:     General: No scleral icterus.    Conjunctiva/sclera: Conjunctivae normal.  Neck:     Musculoskeletal: Neck supple.  Pulmonary:     Effort: Pulmonary effort is normal.  Chest:     Comments: bebe size firm and minimally mobile lump palpated on R mid breast. Rest of breast exam bilaterally was neg with no axillary nodes.  Musculoskeletal:        General: No swelling, tenderness, deformity or signs of injury.     Right lower leg: No edema.     Left lower leg: No edema.     Comments: R KNEE- normal exam today and I was not able to provoke her pain.   Skin:    General: Skin is warm and dry.     Capillary Refill: Capillary refill takes less than 2 seconds.     Findings: No bruising, erythema or rash.  Neurological:     Mental Status: She is alert and oriented to person, place, and time.     Motor: No weakness.     Gait: Gait normal.     Deep Tendon Reflexes: Reflexes normal.  Psychiatric:        Mood and Affect: Mood normal.        Behavior: Behavior normal.        Thought Content: Thought content normal.        Judgment: Judgment normal.     Assessment And Plan:   1. History of gestational diabetes- I will check the status of her glucose since she is having polyuria.  - Hemoglobin A1c - POCT Urinalysis Dipstick (81002) - POCT UA - Microalbumin  2. Chronic pain of right knee- she says she does not have time for PT, and      wants to see ortho.  - Ambulatory referral to  Orthopedic Surgery  3. Breast lump on left side at 3 o'clock position-acute.  - MM Digital Diagnostic Bilat; Future  4. Class 3 severe obesity with body mass index (BMI) of 40.0 to 44.9 in adult, unspecified obesity type, unspecified whether serious  comorbidity present (HCC)- stable. States she has been in the 280's. She was offered to go to [facility], but she states she does not have time for this. I taught her how to use MyFitnesspal since she does not keep up with her calories. I reviewed the health risk factors if BMI greater than 28.   FU for complete physical and pap.    01/31/19 [facility] faxed  HGBA1C from 01/26/19 and is 4.8.   Jaylenn Baiza RODRIGUEZ-SOUTHWORTH, PA-C

## 2019-01-26 NOTE — Patient Instructions (Signed)

## 2019-01-27 ENCOUNTER — Encounter: Payer: Self-pay | Admitting: Internal Medicine

## 2019-01-27 DIAGNOSIS — G8929 Other chronic pain: Secondary | ICD-10-CM | POA: Insufficient documentation

## 2019-01-27 DIAGNOSIS — M25561 Pain in right knee: Secondary | ICD-10-CM

## 2019-01-27 DIAGNOSIS — N6320 Unspecified lump in the left breast, unspecified quadrant: Principal | ICD-10-CM

## 2019-01-27 DIAGNOSIS — N6325 Unspecified lump in the left breast, overlapping quadrants: Secondary | ICD-10-CM

## 2019-01-27 DIAGNOSIS — Z6841 Body Mass Index (BMI) 40.0 and over, adult: Secondary | ICD-10-CM

## 2019-01-27 DIAGNOSIS — Z8632 Personal history of gestational diabetes: Secondary | ICD-10-CM | POA: Insufficient documentation

## 2019-01-27 HISTORY — DX: Pain in right knee: M25.561

## 2019-01-27 HISTORY — DX: Unspecified lump in the left breast, overlapping quadrants: N63.25

## 2019-02-10 ENCOUNTER — Other Ambulatory Visit: Payer: Self-pay | Admitting: Internal Medicine

## 2019-02-10 DIAGNOSIS — N6325 Unspecified lump in the left breast, overlapping quadrants: Secondary | ICD-10-CM

## 2019-02-10 DIAGNOSIS — N6320 Unspecified lump in the left breast, unspecified quadrant: Principal | ICD-10-CM

## 2019-02-18 ENCOUNTER — Ambulatory Visit
Admission: RE | Admit: 2019-02-18 | Discharge: 2019-02-18 | Disposition: A | Discharge status: Home / Self Care | Payer: No Typology Code available for payment source | Source: Ambulatory Visit | Attending: Internal Medicine | Admitting: Internal Medicine

## 2019-02-18 ENCOUNTER — Ambulatory Visit: Payer: Self-pay

## 2019-02-18 DIAGNOSIS — N6325 Unspecified lump in the left breast, overlapping quadrants: Secondary | ICD-10-CM

## 2019-02-18 DIAGNOSIS — N6320 Unspecified lump in the left breast, unspecified quadrant: Principal | ICD-10-CM

## 2019-02-23 ENCOUNTER — Encounter: Payer: No Typology Code available for payment source | Admitting: Internal Medicine

## 2019-02-24 ENCOUNTER — Telehealth: Payer: Self-pay

## 2019-02-24 NOTE — Telephone Encounter (Signed)
Lab results given

## 2019-02-25 ENCOUNTER — Ambulatory Visit (INDEPENDENT_AMBULATORY_CARE_PROVIDER_SITE_OTHER): Payer: No Typology Code available for payment source | Admitting: Orthopaedic Surgery

## 2019-03-03 ENCOUNTER — Encounter: Payer: No Typology Code available for payment source | Admitting: Internal Medicine

## 2019-03-10 ENCOUNTER — Other Ambulatory Visit: Payer: Self-pay | Admitting: Internal Medicine

## 2019-03-10 NOTE — Progress Notes (Unsigned)
Please call pt

## 2019-03-11 ENCOUNTER — Ambulatory Visit (INDEPENDENT_AMBULATORY_CARE_PROVIDER_SITE_OTHER): Payer: No Typology Code available for payment source | Admitting: Orthopaedic Surgery

## 2019-03-16 ENCOUNTER — Ambulatory Visit (INDEPENDENT_AMBULATORY_CARE_PROVIDER_SITE_OTHER): Payer: No Typology Code available for payment source | Admitting: Orthopaedic Surgery

## 2019-03-23 ENCOUNTER — Encounter: Payer: No Typology Code available for payment source | Admitting: Internal Medicine

## 2019-04-22 ENCOUNTER — Telehealth: Payer: No Typology Code available for payment source | Admitting: Physician Assistant

## 2019-04-22 DIAGNOSIS — J029 Acute pharyngitis, unspecified: Secondary | ICD-10-CM

## 2019-04-22 NOTE — Progress Notes (Signed)
We are sorry that you are not feeling well.  Here is how we plan to help!  Your symptoms indicate a likely viral infection (Pharyngitis).   Pharyngitis is inflammation in the back of the throat which can cause a sore throat, scratchiness and sometimes difficulty swallowing.   Pharyngitis is typically caused by a respiratory virus and will just run its course.  Please keep in mind that your symptoms could last up to 10 days.  For throat pain, we recommend over the counter oral pain relief medications such as acetaminophen or aspirin, or anti-inflammatory medications such as ibuprofen or naproxen sodium.  Topical treatments such as oral throat lozenges or sprays may be used as needed.  Avoid close contact with loved ones, especially the very young and elderly.  Remember to wash your hands thoroughly throughout the day as this is the number one way to prevent the spread of infection and wipe down door knobs and counters with disinfectant.  If you are taking therapeutic doses of tylenol and ibuprofen and still experiencing this pain over the next 2-3 days, or you develop a new fever, I would recommend going to your primary care doctor or local [facility] so you can be tested for strep throat.   After careful review of your answers, I would not recommend and antibiotic for your condition.  Antibiotics should not be used to treat conditions that we suspect are caused by viruses like the virus that causes the common cold or flu. However, some people can have Strep with atypical symptoms. You may need formal testing in clinic or office to confirm if your symptoms continue or worsen.  Providers prescribe antibiotics to treat infections caused by bacteria. Antibiotics are very powerful in treating bacterial infections when they are used properly.  To maintain their effectiveness, they should be used only when necessary.  Overuse of antibiotics has resulted in the development of super bugs that are resistant to  treatment!    Home Care:  Only take medications as instructed by your medical team.  Do not drink alcohol while taking these medications.  A steam or ultrasonic humidifier can help congestion.  You can place a towel over your head and breathe in the steam from hot water coming from a faucet.  Avoid close contacts especially the very young and the elderly.  Cover your mouth when you cough or sneeze.  Always remember to wash your hands.  Get Help Right Away If:  You develop worsening fever or throat pain.  You develop a severe head ache or visual changes.  Your symptoms persist after you have completed your treatment plan.  Make sure you  Understand these instructions.  Will watch your condition.  Will get help right away if you are not doing well or get worse.  Your e-visit answers were reviewed by a board certified advanced clinical practitioner to complete your personal care plan.  Depending on the condition, your plan could have included both over the counter or prescription medications.  If there is a problem please reply  once you have received a response from your provider.  Your safety is important to [place].  If you have drug allergies check your prescription carefully.    You can use MyChart to ask questions about todays visit, request a non-urgent call back, or ask for a work or school excuse for 24 hours related to this e-Visit. If it has been greater than 24 hours you will need to follow up with your provider, or  enter a new e-Visit to address those concerns.  You will get an e-mail in the next two days asking about your experience.  I hope that your e-visit has been valuable and will speed your recovery. Thank you for using e-visits.  I have spent 5 minutes in review of e-visit questionnaire, review and updating patient chart, medical decision making and response to patient.    Benjiman CoreBrittany Arlenne Kimbley, PA-C

## 2019-04-25 ENCOUNTER — Emergency Department (HOSPITAL_COMMUNITY)
Admission: EM | Admit: 2019-04-25 | Discharge: 2019-04-25 | Disposition: A | Discharge status: Home / Self Care | Payer: No Typology Code available for payment source | Attending: Emergency Medicine | Admitting: Emergency Medicine

## 2019-04-25 ENCOUNTER — Encounter (HOSPITAL_COMMUNITY): Payer: Self-pay | Admitting: Emergency Medicine

## 2019-04-25 ENCOUNTER — Other Ambulatory Visit: Payer: Self-pay

## 2019-04-25 DIAGNOSIS — J029 Acute pharyngitis, unspecified: Secondary | ICD-10-CM | POA: Insufficient documentation

## 2019-04-25 LAB — GROUP A STREP BY PCR: Group A Strep by PCR: NOT DETECTED

## 2019-04-25 NOTE — ED Triage Notes (Signed)
Pt. Stated, I woke up on Friday and my throat was hurting and now its worse.

## 2019-04-25 NOTE — ED Provider Notes (Signed)
[facility]   CSN: [id number] Arrival date & time: 04/25/19  [id number]    History   Chief Complaint Chief Complaint  Patient presents with  . Sore Throat    HPI Audrey [name] is a 33 y.o. female.     HPI   33 year old female presents today with complaints of sore throat.  Has her symptoms started approximately 3 days ago with sore throat.  She notes some rhinorrhea.  She notes minimal non productive cough, no shortness of breath.  She denies fever.  No medications prior to arrival.  She denies any close sick contacts.  She notes pain with swallowing but no difficulty swallowing.    Past Medical History:  Diagnosis Date  . Common migraine with intractable migraine 01/23/2017  . Diabetes mellitus without complication (HCC)   . Fetal demise 05/19/2013  . GERD (gastroesophageal reflux disease)    with pregnancy  . Gestational diabetes   . Headache    migraines  . Hemorrhage after delivery of fetus 08/12/2006  . Morbidly obese (HCC) 01/23/2017  . Preeclampsia   . Pregnancy induced hypertension   . S/P cesarean section 05/20/2013  . S/P cesarean section 11/18/2014    Patient Active Problem List   Diagnosis Date Noted  . Class 3 severe obesity with body mass index (BMI) of 40.0 to 44.9 in adult [facility](HCC) 01/27/2019  . Chronic pain of right knee 01/27/2019  . History of gestational diabetes 01/27/2019  . Breast lump on left side at 3 o'clock position 01/27/2019  . Cesarean delivery delivered 01/26/2019  . Gastroesophageal reflux disease 01/26/2019  . Morbidly obese (HCC) 01/23/2017  . Common migraine with intractable migraine 01/23/2017  . S/P cesarean section 11/18/2014  . Fetal heart rate nonreactive   . [redacted] weeks gestation of pregnancy   . Non-reactive NST (non-stress test) 11/15/2014  . Migraine 07/12/2014    Past Surgical History:  Procedure Laterality Date  . CESAREAN SECTION    . CESAREAN SECTION N/A 05/19/2013   Procedure: CESAREAN SECTION;  Surgeon: Sherron MondayJody Bovard, MD;  Location: [facility];  Service: Obstetrics;  Laterality: N/A;  . CESAREAN SECTION WITH BILATERAL TUBAL LIGATION Bilateral 11/17/2014   Procedure: CESAREAN SECTION WITH BILATERAL TUBAL LIGATION;  Surgeon: Sherian ReinJody Bovard-Stuckert, MD;  Location: [facility];  Service: Obstetrics;  Laterality: Bilateral;  . TOENAIL EXCISION    . WISDOM TOOTH EXTRACTION       OB History    Gravida  3   Para  3   Term  3   Preterm  0   AB  0   Living  2     SAB  0   TAB  0   Ectopic  0   Multiple  0   Live Births  2            Home Medications    Prior to Admission medications   Medication Sig Start Date End Date Taking? Authorizing Provider  acetaminophen (TYLENOL) 500 MG tablet Take 1,000 mg by mouth every 6 (six) hours as needed for headache.    [provider]  aspirin-acetaminophen-caffeine (EXCEDRIN MIGRAINE) [phone] [id number] MG tablet Take 2 tablets by mouth every 8 (eight) hours as needed for headache.    [provider]  omeprazole (PRILOSEC) 20 MG capsule Take 20 mg by mouth daily as needed (heartburn).     [provider]    Family History Family History  Adopted: Yes  Problem Relation Age of Onset  .  Diabetes Mother   . Asthma Other   . Hyperlipidemia Other   . Obesity Other     Social History Social History   Tobacco Use  . Smoking status: Never Smoker  . Smokeless tobacco: Never Used  Substance Use Topics  . Alcohol use: No  . Drug use: No     Allergies   Imitrex [sumatriptan]   Review of Systems Review of Systems  All other systems reviewed and are negative.    Physical Exam Updated Vital Signs BP 133/89 (BP Location: Right Arm)   Pulse 80   Temp 98.4 F (36.9 C) (Oral)   Resp 17   Ht 5' 7.5" (1.715 m)   Wt 127 kg   LMP 04/12/2019   SpO2 98%   BMI 43.21 kg/m   Physical Exam Vitals signs and nursing note reviewed.  Constitutional:      Appearance: She is  well-developed.  HENT:     Head: Normocephalic and atraumatic.     Mouth/Throat:     Tonsils: 0 on the right. 0 on the left.     Comments: Oropharynx is clear no tonsillar swelling erythema, exudate.  No pooling of secretions, minor bilateral tender anterior cervical lymphadenopathy neck supple full active range of motion Eyes:     General: No scleral icterus.       Right eye: No discharge.        Left eye: No discharge.     Conjunctiva/sclera: Conjunctivae normal.     Pupils: Pupils are equal, round, and reactive to light.  Neck:     Musculoskeletal: Normal range of motion.     Vascular: No JVD.     Trachea: No tracheal deviation.  Pulmonary:     Effort: Pulmonary effort is normal. No respiratory distress.     Breath sounds: Normal breath sounds. No stridor. No wheezing, rhonchi or rales.  Neurological:     Mental Status: She is alert and oriented to person, place, and time.     Coordination: Coordination normal.  Psychiatric:        Behavior: Behavior normal.        Thought Content: Thought content normal.        Judgment: Judgment normal.      ED Treatments / Results  Labs (all labs ordered are listed, but only abnormal results are displayed) Labs Reviewed  GROUP A STREP BY PCR    EKG None  Radiology No results found.  Procedures Procedures (including critical care time)  Medications Ordered in ED Medications - No data to display   Initial Impression / Assessment and Plan / ED Course  I have reviewed the triage vital signs and the nursing notes.  Pertinent labs & imaging results that were available during my care of the patient were reviewed by me and considered in my medical decision making (see chart for details).        33 year old female presents today with sore throat.  She has no objective findings of my exam, her throat is clear.  She is afebrile with no productive cough.  I have low suspicion for COVID 19, low suspicion for bacterial etiology  negative strep here.  Patient instructed to monitor for any new or worsening signs or symptoms return immediately she develops any.  She verbalized understanding and agreement to this plan had no further questions or concerns.  Final Clinical Impressions(s) / ED Diagnoses   Final diagnoses:  Viral pharyngitis    ED Discharge Orders  None       HedgesEyvonne Mechanicordelia Poche 04/25/19 1610    Maia Plan, MD 04/25/19 2025

## 2019-04-25 NOTE — ED Notes (Signed)
Pt given discharge instructions and follow up information. Pt given the opportunity to ask questions. Pt verbalized understanding. Pt discharged without incident.  

## 2019-04-25 NOTE — Discharge Instructions (Addendum)
Please read attached information. If you experience any new or worsening signs or symptoms please return to the emergency room for evaluation. Please follow-up with your primary care provider or specialist as discussed. Please use medication prescribed only as directed and discontinue taking if you have any concerning signs or symptoms.   °

## 2019-06-16 ENCOUNTER — Encounter: Payer: Self-pay | Admitting: Orthopaedic Surgery

## 2019-06-16 ENCOUNTER — Ambulatory Visit (INDEPENDENT_AMBULATORY_CARE_PROVIDER_SITE_OTHER): Payer: No Typology Code available for payment source | Admitting: Orthopaedic Surgery

## 2019-06-16 ENCOUNTER — Ambulatory Visit: Payer: Self-pay

## 2019-06-16 ENCOUNTER — Ambulatory Visit (INDEPENDENT_AMBULATORY_CARE_PROVIDER_SITE_OTHER): Payer: No Typology Code available for payment source

## 2019-06-16 ENCOUNTER — Other Ambulatory Visit: Payer: Self-pay

## 2019-06-16 VITALS — BP 136/96 | HR 70 | Ht 67.5 in | Wt 290.0 lb

## 2019-06-16 DIAGNOSIS — M25562 Pain in left knee: Secondary | ICD-10-CM

## 2019-06-16 DIAGNOSIS — G8929 Other chronic pain: Secondary | ICD-10-CM | POA: Diagnosis not present

## 2019-06-16 DIAGNOSIS — Z6841 Body Mass Index (BMI) 40.0 and over, adult: Secondary | ICD-10-CM

## 2019-06-16 DIAGNOSIS — M25561 Pain in right knee: Secondary | ICD-10-CM

## 2019-06-16 NOTE — Progress Notes (Signed)
Office Visit Note   Patient: Audrey [name]           Date of Birth: [birth year]           MRN: [id number] Visit Date: 06/16/2019              Requested by: Garey Hamodriguez-Southworth, Sylvia, PA-C [address] [place] [id number] PCP: Garey Hamodriguez-Southworth, Sylvia, PA-C   Assessment & Plan: Visit Diagnoses:  1. Chronic pain of right knee   2. Acute pain of left knee     Plan: Bilateral anterior knee pain with symptoms consistent with bilateral chondromalacia patella.  Long discussion regarding x-ray findings of lateral patella tilt and treatment options.  We will start with course of physical therapy for strengthening exercises and use either Voltaren gel or over-the-counter anti-inflammatory medicines.  Also had a discussion regarding her weight with a BMI of 45.  Consider future cortisone injections in 1 or both knees  Follow-Up Instructions: Return if symptoms worsen or fail to improve.   Orders:  Orders Placed This Encounter  Procedures  . XR KNEE 3 VIEW LEFT  . XR KNEE 3 VIEW RIGHT  . Ambulatory referral to Physical Therapy   No orders of the defined types were placed in this encounter.     Procedures: No procedures performed   Clinical Data: No additional findings.   Subjective: Chief Complaint  Patient presents with  . Left Knee - Pain  . Right Knee - Pain  Patient presents today with bilateral knee pain. She said that the right side is worse. She said that it initially started about 8 months ago while driving only. She said that her right knee would start feeling tight and painful. Now it has progressed and seem to bother her more often. She said that her knee feels swollen and tight, but does not appear to be. She takes Ibuprofen, but does not get relief with that.  No related injury or trauma.  Seems to have more trouble with inclines, bending, stooping or squatting or with knee flexed for any length of time.  No calf pain.  No related distal  edema.  No other joint complaints.  HPI  Review of Systems  Constitutional: Negative for fatigue.  HENT: Negative for ear pain.   Eyes: Negative for pain.  Respiratory: Negative for shortness of breath.   Cardiovascular: Negative for leg swelling.  Gastrointestinal: Negative for constipation and diarrhea.  Endocrine: Negative for cold intolerance and heat intolerance.  Genitourinary: Negative for difficulty urinating.  Musculoskeletal: Positive for joint swelling.  Skin: Negative for rash.  Allergic/Immunologic: Negative for food allergies.  Neurological: Negative for weakness.  Hematological: Does not bruise/bleed easily.  Psychiatric/Behavioral: Negative for sleep disturbance.     Objective: Vital Signs: BP (!) 136/96   Pulse 70   Ht 5' 7.5" (1.715 m)   Wt 290 lb (131.5 kg)   LMP 06/16/2019   BMI 44.75 kg/m   Physical Exam Constitutional:      Appearance: She is well-developed.  Eyes:     Pupils: Pupils are equal, round, and reactive to light.  Pulmonary:     Effort: Pulmonary effort is normal.  Skin:    General: Skin is warm and dry.  Neurological:     Mental Status: She is alert and oriented to person, place, and time.  Psychiatric:        Behavior: Behavior normal.     Ortho Exam awake alert and oriented x3.  Comfortable sitting.  No effusion either knee.  Does have bilateral patellar crepitation and some pain with patella compression.  Slight lateral position of patella.  No medial or lateral joint pain on the right.  Very minimal anterior medial joint pain on the left.  Full extension and flexion of 105 degrees bilaterally without instability.  Straight leg raise negative.  Painless range of motion both hips  Specialty Comments:  No specialty comments available.  Imaging: Xr Knee 3 View Left  Result Date: 06/16/2019 Films of the left knee were obtained in 3 projections standing.  Films are similar to those on the right with lateral patella tilt consistent  with the patient's symptoms of anterior knee pain.  Very minimal peripheral osteophytes in the medial lateral compartment.  Joint spaces are well-maintained.  No ectopic calcification  Xr Knee 3 View Right  Result Date: 06/16/2019 Films of the right knee were obtained in 3 projections standing.  There is about 1 degree of varus.  Very small peripheral osteophytes along the medial lateral compartment.  The compartments appear to be well-maintained.  No acute changes.  No ectopic calcification.  There is lateral patella tilt with some very early spurring consistent with clinical chondromalacia patella    PMFS History: Patient Active Problem List   Diagnosis Date Noted  . Class 3 severe obesity with body mass index (BMI) of 40.0 to 44.9 in adult [facility]) 01/27/2019  . Chronic pain of right knee 01/27/2019  . History of gestational diabetes 01/27/2019  . Breast lump on left side at 3 o'clock position 01/27/2019  . Cesarean delivery delivered 01/26/2019  . Gastroesophageal reflux disease 01/26/2019  . Morbidly obese ([place]) 01/23/2017  . Common migraine with intractable migraine 01/23/2017  . S/P cesarean section 11/18/2014  . Fetal heart rate nonreactive   . [redacted] weeks gestation of pregnancy   . Non-reactive NST (non-stress test) 11/15/2014  . Migraine 07/12/2014   Past Medical History:  Diagnosis Date  . Common migraine with intractable migraine 01/23/2017  . Diabetes mellitus without complication ([place])   . Fetal demise 05/19/2013  . GERD (gastroesophageal reflux disease)    with pregnancy  . Gestational diabetes   . Headache    migraines  . Hemorrhage after delivery of fetus 08/12/2006  . Morbidly obese ([place]) 01/23/2017  . Preeclampsia   . Pregnancy induced hypertension   . S/P cesarean section 05/20/2013  . S/P cesarean section 11/18/2014    Family History  Adopted: Yes  Problem Relation Age of Onset  . Diabetes Mother   . Asthma Other   . Hyperlipidemia Other   . Obesity Other      Past Surgical History:  Procedure Laterality Date  . CESAREAN SECTION    . CESAREAN SECTION N/A 05/19/2013   Procedure: CESAREAN SECTION;  Surgeon: Thornell Sartorius, MD;  Location: [facility];  Service: Obstetrics;  Laterality: N/A;  . CESAREAN SECTION WITH BILATERAL TUBAL LIGATION Bilateral 11/17/2014   Procedure: CESAREAN SECTION WITH BILATERAL TUBAL LIGATION;  Surgeon: Janyth Contes, MD;  Location: [facility];  Service: Obstetrics;  Laterality: Bilateral;  . TOENAIL EXCISION    . WISDOM TOOTH EXTRACTION     Social History   Occupational History    Employer: [employer]  Tobacco Use  . Smoking status: Never Smoker  . Smokeless tobacco: Never Used  Substance and Sexual Activity  . Alcohol use: No  . Drug use: No  . Sexual activity: Not Currently

## 2019-06-20 ENCOUNTER — Ambulatory Visit: Payer: No Typology Code available for payment source | Attending: Orthopaedic Surgery | Admitting: Physical Therapy

## 2019-06-20 ENCOUNTER — Encounter: Payer: Self-pay | Admitting: Physical Therapy

## 2019-06-20 ENCOUNTER — Other Ambulatory Visit: Payer: Self-pay

## 2019-06-20 DIAGNOSIS — G8929 Other chronic pain: Secondary | ICD-10-CM | POA: Insufficient documentation

## 2019-06-20 DIAGNOSIS — M25562 Pain in left knee: Secondary | ICD-10-CM | POA: Insufficient documentation

## 2019-06-20 DIAGNOSIS — M6281 Muscle weakness (generalized): Secondary | ICD-10-CM | POA: Diagnosis present

## 2019-06-20 DIAGNOSIS — M25561 Pain in right knee: Secondary | ICD-10-CM | POA: Diagnosis present

## 2019-06-20 NOTE — Therapy (Signed)
[place], [place], [address] Phone: [phone]   Fax:  [phone]  Physical Therapy Evaluation  Patient Details  Name: Audrey [name] MRN: [id number] Date of Birth: 02-09-1986 Referring Provider (PT): Joni Fears, MD   Encounter Date: 06/20/2019  PT End of Session - 06/20/19 1736    Visit Number  1    Number of Visits  8    Date for PT Re-Evaluation  08/01/19    Authorization Type  [facility] [employer]    PT Start Time  1633    PT Stop Time  1717    PT Time Calculation (min)  44 min    Activity Tolerance  Patient tolerated treatment well    Behavior During Therapy  [facility] for tasks assessed/performed       Past Medical History:  Diagnosis Date  . Common migraine with intractable migraine 01/23/2017  . Diabetes mellitus without complication ([place])   . Fetal demise 05/19/2013  . GERD (gastroesophageal reflux disease)    with pregnancy  . Gestational diabetes   . Headache    migraines  . Hemorrhage after delivery of fetus 08/12/2006  . Morbidly obese ([place]) 01/23/2017  . Preeclampsia   . Pregnancy induced hypertension   . S/P cesarean section 05/20/2013  . S/P cesarean section 11/18/2014    Past Surgical History:  Procedure Laterality Date  . CESAREAN SECTION    . CESAREAN SECTION N/A 05/19/2013   Procedure: CESAREAN SECTION;  Surgeon: Thornell Sartorius, MD;  Location: [facility];  Service: Obstetrics;  Laterality: N/A;  . CESAREAN SECTION WITH BILATERAL TUBAL LIGATION Bilateral 11/17/2014   Procedure: CESAREAN SECTION WITH BILATERAL TUBAL LIGATION;  Surgeon: Janyth Contes, MD;  Location: [facility];  Service: Obstetrics;  Laterality: Bilateral;  . TOENAIL EXCISION    . WISDOM TOOTH EXTRACTION      There were no vitals filed for this visit.   Subjective Assessment - 06/20/19 1633    Subjective  Pt. reports insidious onset right>left knee pain associated with driving (sitting and holding knee in extended  position) about 8 months ago. Pain and crepitus noted also with squatting motions. Pt. saw MD and was noted to have lateral patellar tilt as well as some degenerative changes consistent with chondromalacia patella.,    Pertinent History  Obesity    Limitations  Sitting;Walking;Standing    How long can you sit comfortably?  no limitations sitting with knee flexed but driving tolerance limited to 1 hour without significant discomfort    How long can you stand comfortably?  no limitations    How long can you walk comfortably?  no limitations    Diagnostic tests  X-rays    Patient Stated Goals  strengthen muscles    Currently in Pain?  No/denies   no pain at rest at eval but reports knee pain 6/10 with exacerbating activities        [facility] PT Assessment - 06/20/19 0001      Assessment   Medical Diagnosis  Bilateral chondromalacia patella    Referring Provider (PT)  Joni Fears, MD    Onset Date/Surgical Date  09/28/18   estimated per report of 8 month symptom history   Hand Dominance  Right    Next MD Visit  08/03/2019    Prior Therapy  none      Precautions   Precautions  None      Balance Screen   Has the patient fallen in the past 6 months  No      Prior Function   Level of Independence  Independent with community mobility without device    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 hip ROM grossly WFL    AROM Assessment Site  Knee    Right/Left Knee  Right;Left    Right Knee Extension  0    Right Knee Flexion  133    Left Knee Extension  0    Left Knee Flexion  134       Strength   Strength Assessment Site  Hip;Knee    Right/Left Hip  Right;Left    Right Hip Flexion  5/5    Right Hip Extension  4/5    Right Hip External Rotation   4+/5    Right Hip Internal Rotation  5/5    Right Hip ABduction  4/5    Right Hip ADduction  4+/5    Left Hip Flexion  5/5    Left Hip Extension  4/5    Left Hip External Rotation  5/5    Left Hip Internal Rotation  5/5    Left Hip ABduction  4/5    Left Hip ADduction  4+/5    Right/Left Knee  Right;Left    Right Knee Flexion  5/5    Right Knee Extension  5/5    Left Knee Flexion  5/5    Left Knee Extension  5/5      Flexibility   Soft Tissue Assessment /Muscle Length  --   hamstring tightness with SLR 70 deg bilat.     Palpation   Palpation comment  trace crepitus with patellar mobs on right                Objective measurements completed on examination: See above findings.      [facility] Adult PT Treatment/Exercise - 06/20/19 0001      Exercises   Exercises  Knee/Hip      Knee/Hip Exercises: Supine   Other Supine Knee/Hip Exercises  HEP handout review-see chart copy handout      Manual Therapy   Manual Therapy  Taping    McConnell  --   McConnell taping for right knee            PT Education - 06/20/19 1735    Education Details  symptom etiology and exam findings, patellofemoral anatomy and mechanics, POC, patellar taping technique    Person(s) Educated  Patient    Methods  Explanation;Demonstration;Tactile cues;Verbal cues;Handout    Comprehension  Returned demonstration;Verbalized understanding          PT Long Term Goals - 06/20/19 1742      PT LONG TERM GOAL #1   Title  Independent with HEP    Baseline  needs HEP    Time  6    Period  Weeks    Status  New    Target Date  08/01/19      PT LONG TERM GOAL #2   Title  Increase bilat. hip strength grossly 1/2 MMT grade to improve squat mechanics for decreased pain with work duties and driving    Baseline  see obejctive     Time  6    Period  Weeks    Status  New    Target Date  08/01/19      PT LONG TERM GOAL #3   Title  Improve FOTO outcome measure score to 19% or less impaired    Baseline  25% limited    Time  6    Period  Weeks    Status  New    Target Date  08/01/19      PT LONG TERM GOAL #4   Title  Tolerate driving as needed with knee pain <3/10 at worst    Baseline  6/10    Time  6    Period  Weeks    Status  New    Target Date  08/01/19             Plan - 06/20/19 1737    Clinical Impression Statement  Pt. presents with right>left bilateral knee pain consistent with referring dx. chondromalacia patella with knee/hip muscle weakness, muscle tightness and compromised patellofemoral mechanics likely impacting patellar tracking aling with lateral patellar tilt. Pt. would benefit from PT to address current defcits to improve associated functional limitations.    Personal Factors and Comorbidities  Comorbidity 1    Comorbidities  obesity    Examination-Activity Limitations  Squat    Examination-Participation Restrictions  Driving    Stability/Clinical Decision Making  Stable/Uncomplicated    Clinical Decision Making  Low    Rehab Potential  Good    PT Frequency  --   1-2x/week   PT Duration  6 weeks    PT Treatment/Interventions  Taping;ADLs/Self Care Home Management;Ultrasound;Iontophoresis 4mg /ml Dexamethasone;Electrical Stimulation;Functional mobility training;Neuromuscular re-education;Therapeutic activities;Therapeutic exercise;Patient/family education;Manual techniques;Dry needling;Cryotherapy;Moist Heat    PT Next Visit Plan  review HEP and taping as needed, continue/progress exercises with focus HEP progression-add closed chain activities as tolerated pending pain and ability with proper patellofemoral mechanucs    PT Home Exercise Plan  quad sets, supine SLR, hip abd with band, hamstring stretch, bridge with clamshell, add mini wall sit squat pending pain, also instructed in  patellar taping (McConnell) to try before prolonged car travel    Consulted and Agree with Plan of Care  Patient       Patient will benefit from skilled therapeutic intervention in order to improve the following deficits and impairments:  Pain, Impaired flexibility, Decreased strength, Obesity, Decreased activity tolerance  Visit Diagnosis: 1. Chronic pain of right knee   2. Chronic pain of left knee   3. Muscle weakness (generalized)        Problem List Patient Active Problem List   Diagnosis Date Noted  . Class 3 severe obesity with body mass index (BMI) of 40.0 to 44.9 in adult [facility](HCC) 01/27/2019  . Chronic pain of right knee 01/27/2019  . History of gestational diabetes 01/27/2019  . Breast lump on left side at 3 o'clock position 01/27/2019  . Cesarean delivery delivered 01/26/2019  . Gastroesophageal reflux disease 01/26/2019  . Morbidly obese ([facility]) 01/23/2017  . Common migraine with intractable migraine 01/23/2017  . S/P cesarean section 11/18/2014  . Fetal heart rate nonreactive   . [redacted] weeks gestation of pregnancy   . Non-reactive NST (non-stress test) 11/15/2014  . Migraine 07/12/2014    Lazarus Gowdahristopher Kein Carlberg, PT, DPT 06/20/19 5:46 PM  [facility] [address] [place], [place], [address] Phone: [phone]   Fax:  [phone]  Name: Sherilyn Dacostalycia D Orihuela MRN: [id number] Date of Birth: September 04, 1986

## 2019-07-12 ENCOUNTER — Encounter: Payer: No Typology Code available for payment source | Admitting: Nurse Practitioner

## 2019-07-28 ENCOUNTER — Encounter: Payer: No Typology Code available for payment source | Admitting: Internal Medicine

## 2019-08-03 ENCOUNTER — Ambulatory Visit: Payer: No Typology Code available for payment source | Admitting: Orthopaedic Surgery

## 2019-08-16 ENCOUNTER — Encounter: Payer: Self-pay | Admitting: Nurse Practitioner

## 2019-08-16 ENCOUNTER — Ambulatory Visit (INDEPENDENT_AMBULATORY_CARE_PROVIDER_SITE_OTHER): Payer: No Typology Code available for payment source | Admitting: Nurse Practitioner

## 2019-08-16 ENCOUNTER — Other Ambulatory Visit: Payer: Self-pay

## 2019-08-16 VITALS — BP 138/90 | HR 92 | Temp 97.8°F | Ht 68.8 in | Wt 283.4 lb

## 2019-08-16 DIAGNOSIS — G43009 Migraine without aura, not intractable, without status migrainosus: Secondary | ICD-10-CM | POA: Diagnosis not present

## 2019-08-16 DIAGNOSIS — Z6841 Body Mass Index (BMI) 40.0 and over, adult: Secondary | ICD-10-CM

## 2019-08-16 DIAGNOSIS — Z0000 Encounter for general adult medical examination without abnormal findings: Secondary | ICD-10-CM | POA: Diagnosis not present

## 2019-08-16 DIAGNOSIS — R42 Dizziness and giddiness: Secondary | ICD-10-CM | POA: Insufficient documentation

## 2019-08-16 DIAGNOSIS — Z8632 Personal history of gestational diabetes: Secondary | ICD-10-CM | POA: Diagnosis not present

## 2019-08-16 DIAGNOSIS — R519 Headache, unspecified: Secondary | ICD-10-CM | POA: Insufficient documentation

## 2019-08-16 DIAGNOSIS — D649 Anemia, unspecified: Secondary | ICD-10-CM

## 2019-08-16 LAB — POCT URINALYSIS DIPSTICK
Bilirubin, UA: NEGATIVE
Glucose, UA: NEGATIVE
Ketones, UA: NEGATIVE
Leukocytes, UA: NEGATIVE
Nitrite, UA: NEGATIVE
Protein, UA: NEGATIVE
Spec Grav, UA: 1.025 (ref 1.010–1.025)
Urobilinogen, UA: 0.2 E.U./dL
pH, UA: 5.5 (ref 5.0–8.0)

## 2019-08-16 MED ORDER — AJOVY 225 MG/1.5ML ~~LOC~~ SOAJ: 225.0000 mg | SUBCUTANEOUS | 5 refills | Status: DC

## 2019-08-16 MED ORDER — FREMANEZUMAB-VFRM 225 MG/1.5ML ~~LOC~~ SOSY
225.0000 mg | PREFILLED_SYRINGE | Freq: Once | SUBCUTANEOUS | Status: AC
  Administered 2019-08-16: 225 mg via SUBCUTANEOUS

## 2019-08-16 NOTE — Patient Instructions (Signed)
Health Maintenance  Topic Date Due  . PNEUMOCOCCAL POLYSACCHARIDE VACCINE AGE 34-64 HIGH RISK  07/26/1987  . OPHTHALMOLOGY EXAM  07/26/1995  . TETANUS/TDAP  07/25/2004  . PAP SMEAR-Modifier  07/25/2006  . HEMOGLOBIN A1C  07/27/2019  . INFLUENZA VACCINE  07/30/2019  . URINE MICROALBUMIN  01/27/2020  . FOOT EXAM  08/15/2020  . HIV Screening  Completed    Health Maintenance, Female Adopting a healthy lifestyle and getting preventive care are important in promoting health and wellness. Ask your health care provider about:  The right schedule for you to have regular tests and exams.  Things you can do on your own to prevent diseases and keep yourself healthy. What should I know about diet, weight, and exercise? Eat a healthy diet   Eat a diet that includes plenty of vegetables, fruits, low-fat dairy products, and lean protein.  Do not eat a lot of foods that are high in solid fats, added sugars, or sodium. Maintain a healthy weight Body mass index (BMI) is used to identify weight problems. It estimates body fat based on height and weight. Your health care provider can help determine your BMI and help you achieve or maintain a healthy weight. Get regular exercise Get regular exercise. This is one of the most important things you can do for your health. Most adults should:  Exercise for at least 150 minutes each week. The exercise should increase your heart rate and make you sweat (moderate-intensity exercise).  Do strengthening exercises at least twice a week. This is in addition to the moderate-intensity exercise.  Spend less time sitting. Even light physical activity can be beneficial. Watch cholesterol and blood lipids Have your blood tested for lipids and cholesterol at 34 years of age, then have this test every 5 years. Have your cholesterol levels checked more often if:  Your lipid or cholesterol levels are high.  You are older than 34 years of age.  You are at high risk  for heart disease. What should I know about cancer screening? Depending on your health history and family history, you may need to have cancer screening at various ages. This may include screening for:  Breast cancer.  Cervical cancer.  Colorectal cancer.  Skin cancer.  Lung cancer. What should I know about heart disease, diabetes, and high blood pressure? Blood pressure and heart disease  High blood pressure causes heart disease and increases the risk of stroke. This is more likely to develop in people who have high blood pressure readings, are of African descent, or are overweight.  Have your blood pressure checked: ? Every 3-5 years if you are 5918-339 years of age. ? Every year if you are 34 years old or older. Diabetes Have regular diabetes screenings. This checks your fasting blood sugar level. Have the screening done:  Once every three years after age 34 if you are at a normal weight and have a low risk for diabetes.  More often and at a younger age if you are overweight or have a high risk for diabetes. What should I know about preventing infection? Hepatitis B If you have a higher risk for hepatitis B, you should be screened for this virus. Talk with your health care provider to find out if you are at risk for hepatitis B infection. Hepatitis C Testing is recommended for:  Everyone born from [birth year] through 1965.  Anyone with known risk factors for hepatitis C. Sexually transmitted infections (STIs)  Get screened for STIs, including gonorrhea and chlamydia,  if: ? You are sexually active and are younger than 34 years of age. ? You are older than 34 years of age and your health care provider tells you that you are at risk for this type of infection. ? Your sexual activity has changed since you were last screened, and you are at increased risk for chlamydia or gonorrhea. Ask your health care provider if you are at risk.  Ask your health care provider about whether you are  at high risk for HIV. Your health care provider may recommend a prescription medicine to help prevent HIV infection. If you choose to take medicine to prevent HIV, you should first get tested for HIV. You should then be tested every 3 months for as long as you are taking the medicine. Pregnancy  If you are about to stop having your period (premenopausal) and you may become pregnant, seek counseling before you get pregnant.  Take 400 to 800 micrograms (mcg) of folic acid every day if you become pregnant.  Ask for birth control (contraception) if you want to prevent pregnancy. Osteoporosis and menopause Osteoporosis is a disease in which the bones lose minerals and strength with aging. This can result in bone fractures. If you are 34 years old or older, or if you are at risk for osteoporosis and fractures, ask your health care provider if you should:  Be screened for bone loss.  Take a calcium or vitamin D supplement to lower your risk of fractures.  Be given hormone replacement therapy (HRT) to treat symptoms of menopause. Follow these instructions at home: Lifestyle  Do not use any products that contain nicotine or tobacco, such as cigarettes, e-cigarettes, and chewing tobacco. If you need help quitting, ask your health care provider.  Do not use street drugs.  Do not share needles.  Ask your health care provider for help if you need support or information about quitting drugs. Alcohol use  Do not drink alcohol if: ? Your health care provider tells you not to drink. ? You are pregnant, may be pregnant, or are planning to become pregnant.  If you drink alcohol: ? Limit how much you use to 0-1 drink a day. ? Limit intake if you are breastfeeding.  Be aware of how much alcohol is in your drink. In the [place] one drink equals one 12 oz bottle of beer (355 mL), one 5 oz glass of wine (148 mL), or one 1 oz glass of hard liquor (44 mL). General instructions  Schedule regular health,  dental, and eye exams.  Stay current with your vaccines.  Tell your health care provider if: ? You often feel depressed. ? You have ever been abused or do not feel safe at home. Summary  Adopting a healthy lifestyle and getting preventive care are important in promoting health and wellness.  Follow your health care provider's instructions about healthy diet, exercising, and getting tested or screened for diseases.  Follow your health care provider's instructions on monitoring your cholesterol and blood pressure. This information is not intended to replace advice given to you by your health care provider. Make sure you discuss any questions you have with your health care provider. Document Released: 06/30/2011 Document Revised: 12/08/2018 Document Reviewed: 12/08/2018 Elsevier Patient Education  2020 [employer].

## 2019-08-16 NOTE — Progress Notes (Addendum)
Subjective:     Patient ID: Audrey [name] , female    DOB: July 01, 1986 , 33 y.o.   MRN: [id number]   Chief Complaint  Patient presents with  . Annual Exam   The patient states she uses tubal ligation for birth control. Last LMP was Patient's last menstrual period was 08/11/2019.. Negative for Dysmenorrhea and Negative for Menorrhagia Mammogram last done 02/18/2019 next due in 6 years at age 33. Negative for: breast discharge, breast lump(s), breast pain and breast self exam.  Pertinent negatives include abnormal bleeding (hematology), anxiety, decreased libido, depression, difficulty falling sleep, dyspareunia, history of infertility, nocturia, sexual dysfunction, sleep disturbances, urinary incontinence, urinary urgency, vaginal discharge and vaginal itching. Diet regular. The patient states her exercise level is   walking 2 days per week 2-2.5 miles.  Couple months.   The patient's tobacco use is:  Social History   Tobacco Use  Smoking Status Never Smoker  Smokeless Tobacco Never Used   She has been exposed to passive smoke. The patient's alcohol use is:  Social History   Substance and Sexual Activity  Alcohol Use No   Additional information: Last pap 2018 or 2019, next one scheduled for  HPI  Here for HM.  She has a history of Gestational Diabetes  She will also have left hand numbness, feeling will return.    Denies hearing loss, speech changes or vision loss. She has headaches at least 3 times a week. She has not been tested for sleep study. She is just now taking the ibuprofen for her headaches.  Mostly treating her knees.  She is taking excedrin more regularly.  She has taken topiramate.         Headache  This is a chronic (since age 33) problem. The current episode started more than 1 year ago. The problem has been gradually worsening. The pain is located in the frontal region. The quality of the pain is described as aching and sharp. Associated symptoms include numbness  (intermittent numbness of left arm with headaches). Pertinent negatives include no abdominal pain. Exacerbated by: she is having new sharp pains to her head on the left side, stays for a few minutes then goes away.  no vision loss.  She has tried nothing for the symptoms. The treatment provided no relief. There is no history of hypertension.     Past Medical History:  Diagnosis Date  . Common migraine with intractable migraine 01/23/2017  . Diabetes mellitus without complication ([facility])   . Fetal demise 05/19/2013  . GERD (gastroesophageal reflux disease)    with pregnancy  . Gestational diabetes   . Headache    migraines  . Hemorrhage after delivery of fetus 08/12/2006  . Morbidly obese ([facility]) 01/23/2017  . Preeclampsia   . Pregnancy induced hypertension   . S/P cesarean section 05/20/2013  . S/P cesarean section 11/18/2014     Family History  Adopted: Yes  Problem Relation Age of Onset  . Diabetes Mother   . Asthma Other   . Hyperlipidemia Other   . Obesity Other      Current Outpatient Medications:  .  aspirin-acetaminophen-caffeine (EXCEDRIN MIGRAINE) 250-250-65 MG tablet, Take 2 tablets by mouth every 8 (eight) hours as needed for headache., Disp: , Rfl:  .  ibuprofen (ADVIL) 200 MG tablet, Take 200 mg by mouth every 6 (six) hours as needed., Disp: , Rfl:    Allergies  Allergen Reactions  . Imitrex [Sumatriptan]     Nausea  Review of Systems  Constitutional: Negative.   HENT: Negative.   Eyes: Negative.   Respiratory: Negative.   Cardiovascular: Negative.   Gastrointestinal: Negative.  Negative for abdominal pain.  Endocrine: Negative.   Genitourinary: Negative.   Musculoskeletal: Positive for arthralgias.  Skin: Negative.   Allergic/Immunologic: Negative.   Neurological: Positive for numbness (intermittent numbness of left arm with headaches) and headaches (she is having frontal and left sided headaches at least 3 days a week, now having sharp shooting pains).   Hematological: Negative.   Psychiatric/Behavioral: Negative.      Today's Vitals   08/16/19 1417  BP: 138/90  Pulse: 92  Temp: 97.8 F (36.6 C)  TempSrc: Oral  Weight: 283 lb 6.4 oz (128.5 kg)  Height: 5' 8.8" (1.748 m)  PainSc: 5   PainLoc: Head   Body mass index is 42.09 kg/m.   Objective:  Physical Exam Constitutional:      General: She is not in acute distress.    Appearance: Normal appearance. She is well-developed. She is obese.  HENT:     Head: Normocephalic and atraumatic.     Right Ear: Hearing, tympanic membrane, ear canal and external ear normal. There is no impacted cerumen.     Left Ear: Hearing, tympanic membrane, ear canal and external ear normal. There is no impacted cerumen.  Eyes:     General: Lids are normal.     Extraocular Movements: Extraocular movements intact.     Pupils: Pupils are equal, round, and reactive to light.     Funduscopic exam:    Right eye: No papilledema.        Left eye: No papilledema.  Neck:     Musculoskeletal: Full passive range of motion without pain, normal range of motion and neck supple.     Thyroid: No thyroid mass.     Vascular: No carotid bruit.  Cardiovascular:     Rate and Rhythm: Normal rate and regular rhythm.     Pulses: Normal pulses.     Heart sounds: Normal heart sounds. No murmur.  Pulmonary:     Effort: Pulmonary effort is normal.     Breath sounds: Normal breath sounds.  Abdominal:     General: Abdomen is flat. Bowel sounds are normal.     Palpations: Abdomen is soft.  Musculoskeletal: Normal range of motion.        General: No swelling.     Right lower leg: No edema.     Left lower leg: No edema.  Skin:    General: Skin is warm and dry.     Capillary Refill: Capillary refill takes less than 2 seconds.  Neurological:     General: No focal deficit present.     Mental Status: She is alert and oriented to person, place, and time.     Cranial Nerves: No cranial nerve deficit.     Sensory: No  sensory deficit.     Motor: No weakness.  Psychiatric:        Mood and Affect: Mood normal.        Behavior: Behavior normal.        Thought Content: Thought content normal.        Judgment: Judgment normal.         Assessment And Plan:     1. Encounter for general adult medical examination w/o abnormal findings . Behavior modifications discussed and diet history reviewed.   . Pt will continue to exercise regularly and modify diet with low  GI, plant based foods and decrease intake of processed foods.  . Recommend intake of daily multivitamin, Vitamin D, and calcium.  . Recommend for preventive screenings, as well as recommend immunizations that include influenza, TDAP(she is going to check with her employer) - POCT Urinalysis Dipstick (81002) - BMP w Anion Gap (STAT/Sunquest-performed on-site) - Lipid Profile - CBC no Diff  2. Class 3 severe obesity with body mass index (BMI) of 40.0 to 44.9 in adult, unspecified obesity type, unspecified whether serious comorbidity present ([place])  Chronic  Discussed healthy diet and regular exercise options   Encouraged to exercise at least 150 minutes per week with 2 days of strength training  3. Acute Non intractable headache  New sharp shooting headache on left side of head intermittent  With the intermittent numbness will obtain CT scan without contrast - CT Head Wo Contrast; Future  4. History of gestational diabetes  Decreased sensation bilateral feet with diabetic eye exam  Will refer to ophthalmology since had gestational diabetes and never had a diabetic eye exam - Ambulatory referral to Ophthalmology - Hemoglobin A1c  5. Dizziness  Slightly positive orthostats, encouraged to make sure she is drinking adequate amounts of water.   6. Migraine without aura and without status migrainosus, not intractable  Given first injection of Ajovy in office, she is to keep a log of her headaches to see if improved.    Encouraged to  avoid taking excessive amounts of excedrin migraine  If effective will consider adding ubrelvy as needed - Fremanezumab-vfrm (AJOVY) 225 MG/1.5ML SOAJ; Inject 225 mg into the skin every 30 (thirty) days.  Dispense: 1 pen; Refill: 5 - Fremanezumab-vfrm SOSY 225 mg    Minette Brine, FNP    THE PATIENT IS ENCOURAGED TO PRACTICE SOCIAL DISTANCING DUE TO THE COVID-19 PANDEMIC.

## 2019-08-17 ENCOUNTER — Encounter: Payer: Self-pay | Admitting: Orthopaedic Surgery

## 2019-08-17 ENCOUNTER — Ambulatory Visit (INDEPENDENT_AMBULATORY_CARE_PROVIDER_SITE_OTHER): Payer: No Typology Code available for payment source | Admitting: Orthopaedic Surgery

## 2019-08-17 ENCOUNTER — Other Ambulatory Visit: Payer: Self-pay | Admitting: Nurse Practitioner

## 2019-08-17 VITALS — BP 134/92 | HR 71 | Ht 68.0 in | Wt 283.0 lb

## 2019-08-17 DIAGNOSIS — M25561 Pain in right knee: Secondary | ICD-10-CM

## 2019-08-17 DIAGNOSIS — G8929 Other chronic pain: Secondary | ICD-10-CM

## 2019-08-17 DIAGNOSIS — D649 Anemia, unspecified: Secondary | ICD-10-CM

## 2019-08-17 DIAGNOSIS — R42 Dizziness and giddiness: Secondary | ICD-10-CM

## 2019-08-17 MED ORDER — METHYLPREDNISOLONE ACETATE 40 MG/ML IJ SUSP
80.0000 mg | INTRAMUSCULAR | Status: AC | PRN
  Administered 2019-08-17: 80 mg via INTRA_ARTICULAR

## 2019-08-17 MED ORDER — LIDOCAINE HCL 1 % IJ SOLN
2.0000 mL | INTRAMUSCULAR | Status: AC | PRN
  Administered 2019-08-17: 2 mL

## 2019-08-17 MED ORDER — BUPIVACAINE HCL 0.5 % IJ SOLN
2.0000 mL | INTRAMUSCULAR | Status: AC | PRN
  Administered 2019-08-17: 2 mL via INTRA_ARTICULAR

## 2019-08-17 NOTE — Addendum Note (Signed)
Addended by: Steward Ros on: 08/17/2019 05:32 PM   Modules accepted: Orders

## 2019-08-17 NOTE — Progress Notes (Signed)
Office Visit Note   Patient: Audrey [name]           Date of Birth: 11/30/1986           MRN: [id number] Visit Date: 08/17/2019              Requested by: Shelby Mattocks, PA-C [address] [place],  [place] [address] PCP: Minette Brine, FNP   Assessment & Plan: Visit Diagnoses:  1. Chronic pain of right knee   2. Morbidly obese ([place])     Plan: Bilateral knee pain but predominantly a problem with the right knee.  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  Has been involved in physical therapy and taking over-the-counter medicines but still having some pain predominately along the anterior aspect of her knee.  Will inject with cortisone.  Again had discussion about weight loss and exercises and have her return in 3 to 4 weeks if no improvement.  Consider MRI scan  Follow-Up Instructions: Return if symptoms worsen or fail to improve.   Orders:  Orders Placed This Encounter  Procedures  . Large Joint Inj: R knee   No orders of the defined types were placed in this encounter.     Procedures: Large Joint Inj: R knee on 08/17/2019 9:30 AM Indications: pain and diagnostic evaluation Details: 25 G 1.5 in needle, anteromedial approach  Arthrogram: No  Medications: 2 mL lidocaine 1 %; 2 mL bupivacaine 0.5 %; 80 mg methylPREDNISolone acetate 40 MG/ML Procedure, treatment alternatives, risks and benefits explained, specific risks discussed. Consent was given by the patient. Immediately prior to procedure a time out was called to verify the correct patient, procedure, equipment, support staff and site/side marked as required. Patient was prepped and draped in the usual sterile fashion.       Clinical Data: No additional findings.   Subjective: Chief Complaint  Patient presents with  . Right Knee - Follow-up  Patient presents today for a two month follow up on her right knee pain. She has been to therapy for one visit and states that she was  given home exercises. She said that her knee has not improved, but in fact feels worse. She takes Ibuprofen as needed. She forgot about trying the Voltaren gel that was recommended.   HPI  Review of Systems   Objective: Vital Signs: BP (!) 134/92   Pulse 71   Ht 5\' 8"  (1.727 m)   Wt 283 lb (128.4 kg)   LMP 08/11/2019   BMI 43.03 kg/m   Physical Exam Constitutional:      Appearance: She is well-developed.  Eyes:     Pupils: Pupils are equal, round, and reactive to light.  Pulmonary:     Effort: Pulmonary effort is normal.  Skin:    General: Skin is warm and dry.  Neurological:     Mental Status: She is alert and oriented to person, place, and time.  Psychiatric:        Behavior: Behavior normal.     Ortho Exam right knee with considerable patellar crepitation and pain with compression.  No effusion.  No joint pain.  Full quick extension flexion over 105 degrees.  No instability.  Neurologically intact.  Straight leg raise negative.  Painless range of motion both hips  Specialty Comments:  No specialty comments available.  Imaging: No results found.   PMFS History: Patient Active Problem List   Diagnosis Date Noted  . Dizziness 08/16/2019  . Cephalalgia 08/16/2019  . Class  3 severe obesity with body mass index (BMI) of 40.0 to 44.9 in adult [facility](HCC) 01/27/2019  . Chronic pain of right knee 01/27/2019  . History of gestational diabetes 01/27/2019  . Breast lump on left side at 3 o'clock position 01/27/2019  . Cesarean delivery delivered 01/26/2019  . Gastroesophageal reflux disease 01/26/2019  . Morbidly obese (HCC) 01/23/2017  . Common migraine with intractable migraine 01/23/2017  . S/P cesarean section 11/18/2014  . Fetal heart rate nonreactive   . [redacted] weeks gestation of pregnancy   . Non-reactive NST (non-stress test) 11/15/2014  . Migraine 07/12/2014   Past Medical History:  Diagnosis Date  . Common migraine with intractable migraine 01/23/2017  . Diabetes  mellitus without complication (HCC)   . Fetal demise 05/19/2013  . GERD (gastroesophageal reflux disease)    with pregnancy  . Gestational diabetes   . Headache    migraines  . Hemorrhage after delivery of fetus 08/12/2006  . Morbidly obese (HCC) 01/23/2017  . Preeclampsia   . Pregnancy induced hypertension   . S/P cesarean section 05/20/2013  . S/P cesarean section 11/18/2014    Family History  Adopted: Yes  Problem Relation Age of Onset  . Diabetes Mother   . Asthma Other   . Hyperlipidemia Other   . Obesity Other     Past Surgical History:  Procedure Laterality Date  . CESAREAN SECTION    . CESAREAN SECTION N/A 05/19/2013   Procedure: CESAREAN SECTION;  Surgeon: Sherron MondayJody Bovard, MD;  Location: [facility];  Service: Obstetrics;  Laterality: N/A;  . CESAREAN SECTION WITH BILATERAL TUBAL LIGATION Bilateral 11/17/2014   Procedure: CESAREAN SECTION WITH BILATERAL TUBAL LIGATION;  Surgeon: Sherian ReinJody Bovard-Stuckert, MD;  Location: [facility];  Service: Obstetrics;  Laterality: Bilateral;  . TOENAIL EXCISION    . WISDOM TOOTH EXTRACTION     Social History   Occupational History    Employer: [employer]  Tobacco Use  . Smoking status: Never Smoker  . Smokeless tobacco: Never Used  Substance and Sexual Activity  . Alcohol use: No  . Drug use: No  . Sexual activity: Not Currently

## 2019-08-19 LAB — LIPID PANEL
Cholesterol: 174 mg/dL (ref <200)
HDL: 44 mg/dL — ABNORMAL LOW (ref >=50)
LDL Cholesterol (Calc): 108 mg/dL (calc) — ABNORMAL HIGH
Non-HDL Cholesterol (Calc): 130 mg/dL (calc) — ABNORMAL HIGH (ref <130)
Total CHOL/HDL Ratio: 4 (calc) (ref <5.0)
Triglycerides: 117 mg/dL (ref <150)

## 2019-08-19 LAB — TEST AUTHORIZATION

## 2019-08-19 LAB — BASIC METABOLIC PANEL
BUN: 9 mg/dL (ref 7–25)
CO2: 26 mmol/L (ref 20–32)
Calcium: 9.2 mg/dL (ref 8.6–10.2)
Chloride: 105 mmol/L (ref 98–110)
Creat: 0.7 mg/dL (ref 0.50–1.10)
Glucose, Bld: 79 mg/dL (ref 65–99)
Potassium: 3.6 mmol/L (ref 3.5–5.3)
Sodium: 140 mmol/L (ref 135–146)

## 2019-08-19 LAB — HEMOGLOBIN A1C
Hgb A1c MFr Bld: 5 % of total Hgb (ref <5.7)
Mean Plasma Glucose: 97 (calc)
eAG (mmol/L): 5.4 (calc)

## 2019-08-19 LAB — IRON,TIBC AND FERRITIN PANEL
%SAT: 5 % (calc) — ABNORMAL LOW (ref 16–45)
Ferritin: 8 ng/mL — ABNORMAL LOW (ref 16–154)
Iron: 22 ug/dL — ABNORMAL LOW (ref 40–190)
TIBC: 429 mcg/dL (calc) (ref 250–450)

## 2019-08-19 LAB — CBC
HCT: 35.5 % (ref 35.0–45.0)
Hemoglobin: 10.5 g/dL — ABNORMAL LOW (ref 11.7–15.5)
MCH: 23.3 pg — ABNORMAL LOW (ref 27.0–33.0)
MCHC: 29.6 g/dL — ABNORMAL LOW (ref 32.0–36.0)
MCV: 78.9 fL — ABNORMAL LOW (ref 80.0–100.0)
MPV: 9.1 fL (ref 7.5–12.5)
Platelets: 419 10*3/uL — ABNORMAL HIGH (ref 140–400)
RBC: 4.5 10*6/uL (ref 3.80–5.10)
RDW: 15.9 % — ABNORMAL HIGH (ref 11.0–15.0)
WBC: 12.5 10*3/uL — ABNORMAL HIGH (ref 3.8–10.8)

## 2019-08-22 ENCOUNTER — Other Ambulatory Visit: Payer: Self-pay | Admitting: Nurse Practitioner

## 2019-08-22 DIAGNOSIS — D649 Anemia, unspecified: Secondary | ICD-10-CM

## 2019-08-22 MED ORDER — FERROUS SULFATE 325 (65 FE) MG PO TABS: 325.0000 mg | ORAL_TABLET | Freq: Every day | ORAL | 3 refills | Status: DC

## 2019-08-23 NOTE — Progress Notes (Signed)
Okay, I sent an iron supplement to the pharmacy

## 2019-08-24 ENCOUNTER — Other Ambulatory Visit: Payer: Self-pay

## 2019-08-24 ENCOUNTER — Ambulatory Visit
Admission: RE | Admit: 2019-08-24 | Discharge: 2019-08-24 | Disposition: A | Discharge status: Home / Self Care | Payer: No Typology Code available for payment source | Source: Ambulatory Visit | Attending: Nurse Practitioner | Admitting: Nurse Practitioner

## 2019-08-24 DIAGNOSIS — R519 Headache, unspecified: Secondary | ICD-10-CM

## 2019-08-25 ENCOUNTER — Telehealth: Payer: Self-pay

## 2019-08-25 NOTE — Telephone Encounter (Signed)
I have faxed over patient's prior approval or her CT head/Brain scan along with the last office visits notes to [phone]. YRL,RMA

## 2019-08-31 ENCOUNTER — Telehealth: Payer: Self-pay

## 2019-08-31 ENCOUNTER — Other Ambulatory Visit: Payer: Self-pay | Admitting: Nurse Practitioner

## 2019-08-31 NOTE — Telephone Encounter (Signed)
See if they know which similar drug is covered

## 2019-08-31 NOTE — Telephone Encounter (Signed)
Pharmacy sent over a letter stating patient's Audrey [name] is not covered by her insurance they would like for you to send an alternative. YRL,RMA

## 2019-09-08 ENCOUNTER — Encounter: Payer: No Typology Code available for payment source | Admitting: Internal Medicine

## 2019-09-14 NOTE — Telephone Encounter (Signed)
There is not an alternatives for ajovy without a prior auth. Pt has medicaid [employer]

## 2019-09-15 ENCOUNTER — Telehealth: Payer: Self-pay | Admitting: Nurse Practitioner

## 2019-09-15 NOTE — Telephone Encounter (Signed)
Called [place] to confirm if she has coverage, she has family planning medicaid.  I called the patient to make aware and after speaking with [facility] on [address] she needs to call them with her insurance information so we can see if her insurance covers Ajovy

## 2019-09-20 ENCOUNTER — Other Ambulatory Visit: Payer: Self-pay

## 2019-09-20 ENCOUNTER — Encounter: Payer: Self-pay | Admitting: Nurse Practitioner

## 2019-09-20 ENCOUNTER — Ambulatory Visit (INDEPENDENT_AMBULATORY_CARE_PROVIDER_SITE_OTHER): Payer: No Typology Code available for payment source | Admitting: Nurse Practitioner

## 2019-09-20 VITALS — BP 112/72 | HR 82 | Temp 98.2°F | Ht 69.0 in | Wt 283.8 lb

## 2019-09-20 DIAGNOSIS — G43009 Migraine without aura, not intractable, without status migrainosus: Secondary | ICD-10-CM | POA: Diagnosis not present

## 2019-09-20 DIAGNOSIS — Z09 Encounter for follow-up examination after completed treatment for conditions other than malignant neoplasm: Secondary | ICD-10-CM | POA: Diagnosis not present

## 2019-09-20 NOTE — Progress Notes (Signed)
Subjective:     Patient ID: Audrey [name] , female    DOB: 1986/09/19 , 33 y.o.   MRN: [id number]   Chief Complaint  Patient presents with  . Migraine    patient stated the ajovy worked well for her    HPI  She is here today to follow up for her migraines after receiving Ajovy last month.  Reports her headaches have improved since the Ajovy has had 4-5 headaches per day.  They are not as debilitating.    Migraine  This is a recurrent problem. The current episode started today. The problem occurs intermittently. The problem has been gradually worsening. The pain does not radiate. The quality of the pain is described as aching. Pertinent negatives include no abdominal pain or dizziness. Nothing aggravates the symptoms. Treatments tried: she will take excedrin migraine. Her past medical history is significant for migraine headaches and obesity. There is no history of cancer or hypertension.     Past Medical History:  Diagnosis Date  . Common migraine with intractable migraine 01/23/2017  . Diabetes mellitus without complication (HCC)   . Fetal demise 05/19/2013  . GERD (gastroesophageal reflux disease)    with pregnancy  . Gestational diabetes   . Headache    migraines  . Hemorrhage after delivery of fetus 08/12/2006  . Morbidly obese (HCC) 01/23/2017  . Preeclampsia   . Pregnancy induced hypertension   . S/P cesarean section 05/20/2013  . S/P cesarean section 11/18/2014     Family History  Adopted: Yes  Problem Relation Age of Onset  . Diabetes Mother   . Asthma Other   . Hyperlipidemia Other   . Obesity Other      Current Outpatient Medications:  .  aspirin-acetaminophen-caffeine (EXCEDRIN MIGRAINE) 250-250-65 MG tablet, Take 2 tablets by mouth every 8 (eight) hours as needed for headache., Disp: , Rfl:  .  ferrous sulfate 325 (65 FE) MG tablet, Take 1 tablet (325 mg total) by mouth daily., Disp: 30 tablet, Rfl: 3 .  ibuprofen (ADVIL) 200 MG tablet, Take 200 mg by mouth  every 6 (six) hours as needed., Disp: , Rfl:    Allergies  Allergen Reactions  . Imitrex [Sumatriptan]     Nausea     Review of Systems  Respiratory: Negative.   Cardiovascular: Negative.  Negative for chest pain, palpitations and leg swelling.  Gastrointestinal: Negative for abdominal pain.  Neurological: Negative for dizziness and headaches.  Psychiatric/Behavioral: Negative.      Today's Vitals   09/20/19 1500  BP: 112/72  Pulse: 82  Temp: 98.2 F (36.8 C)  TempSrc: Oral  Weight: 283 lb 12.8 oz (128.7 kg)  Height: 5\' 9"  (1.753 m)  PainSc: 0-No pain   Body mass index is 41.91 kg/m.   Objective:  Physical Exam Vitals signs reviewed.  Constitutional:      General: She is not in acute distress.    Appearance: Normal appearance. She is well-developed. She is obese.  HENT:     Head: Normocephalic and atraumatic.     Right Ear: Hearing normal.     Left Ear: Hearing normal.  Eyes:     General: Lids are normal.     Extraocular Movements: Extraocular movements intact.     Pupils: Pupils are equal, round, and reactive to light.     Funduscopic exam:    Right eye: No papilledema.        Left eye: No papilledema.  Neck:     Musculoskeletal: Full passive  range of motion without pain.     Thyroid: No thyroid mass.     Vascular: No carotid bruit.  Cardiovascular:     Rate and Rhythm: Normal rate and regular rhythm.     Pulses: Normal pulses.     Heart sounds: Normal heart sounds. No murmur.  Pulmonary:     Effort: Pulmonary effort is normal.     Breath sounds: Normal breath sounds.  Abdominal:     General: Abdomen is flat. Bowel sounds are normal.     Palpations: Abdomen is soft.  Musculoskeletal: Normal range of motion.        General: No swelling.     Right lower leg: No edema.     Left lower leg: No edema.  Skin:    General: Skin is warm and dry.     Capillary Refill: Capillary refill takes less than 2 seconds.  Neurological:     General: No focal deficit  present.     Mental Status: She is alert and oriented to person, place, and time.     Cranial Nerves: No cranial nerve deficit.     Sensory: No sensory deficit.     Motor: No weakness.  Psychiatric:        Mood and Affect: Mood normal.        Behavior: Behavior normal.        Thought Content: Thought content normal.        Judgment: Judgment normal.         Assessment And Plan:     1. Migraine without aura and without status migrainosus, not intractable  She is doing much better after taking the first dose of Ajovy in the office last visit  Her headaches have decreased dramatically and are not as debilitated  Unfortunately her insurance does not cover Ajovy will try her on Aimovig  Aimovig 160 mg given in office  Minette Brine, FNP    THE PATIENT IS ENCOURAGED TO PRACTICE SOCIAL DISTANCING DUE TO THE COVID-19 [place].

## 2019-09-28 ENCOUNTER — Other Ambulatory Visit: Payer: Self-pay

## 2019-09-28 MED ORDER — AIMOVIG 140 MG/ML ~~LOC~~ SOAJ: 140.0000 mg | SUBCUTANEOUS | 1 refills | Status: DC

## 2019-09-29 ENCOUNTER — Telehealth: Payer: Self-pay

## 2019-09-29 NOTE — Telephone Encounter (Signed)
Patient's PA for Aimovig as been submitted through covermymeds and we are just waiting on the determination. YRL,RMA

## 2019-10-12 ENCOUNTER — Telehealth: Payer: No Typology Code available for payment source | Admitting: Family

## 2019-10-12 DIAGNOSIS — B002 Herpesviral gingivostomatitis and pharyngotonsillitis: Secondary | ICD-10-CM

## 2019-10-12 MED ORDER — VALACYCLOVIR HCL 1 G PO TABS: 2000.0000 mg | ORAL_TABLET | Freq: Two times a day (BID) | ORAL | 1 refills | Status: DC

## 2019-10-12 NOTE — Progress Notes (Signed)
We are sorry that you are not feeling well.  Here is how we plan to help!  Based on what you have shared with me it looks like you have a Cold Sore. A cold sore (fever blister) is a skin infection caused by the herpes simplex virus (HSV-1). HSV-1 is closely related to the virus that causes genital herpes (HSV-2), but they are not the same even though both viruses can cause oral and genital infections. Cold sores are small, fluid-filled sores inside of the mouth or on the lips, gums, nose, chin, cheeks, or fingers.   The herpes simplex virus can be easily passed (contagious) to other people through close personal contact, such as kissing or sharing personal items. The virus can also spread to other parts of the body, such as the eyes or genitals. Cold sores are contagious until the sores crust over completely. They often heal within 2 weeks. Once a person is infected, the herpes simplex virus remains permanently in the body. Therefore, there is no cure for cold sores, and they often recur when a person is tired, stressed, sick, or gets too much sun. Additional factors that can cause a recurrence include hormone changes in menstruation or pregnancy, certain drugs, and cold weather.  CAUSES  Cold sores are caused by the herpes simplex virus. The virus is spread from person to person through close contact, such as through kissing, touching the affected area, or sharing personal items such as lip balm, razors, or eating utensils.   I have sent in a prescription to your pharmacy of Valacyclovir, or Valtrex, 2000 mg twice a day for 1 day.   HOME CARE INSTRUCTIONS   Only take over-the-counter or prescription medicines for pain, discomfort, or fever as directed by your caregiver. Do not use aspirin.   Use a cotton-tip swab to apply creams or gels to your sores.   Do not touch the sores or pick the scabs. Wash your hands often. Do not touch your eyes without washing your hands first.   Avoid kissing, oral  sex, and sharing personal items until sores heal.   Apply an ice pack on your sores for 10-15 minutes to ease any discomfort.   Avoid hot, cold, or salty foods because they may hurt your mouth. Eat a soft, bland diet to avoid irritating the sores. Use a straw to drink if you have pain when drinking out of a glass.   Keep sores clean and dry to prevent an infection of other tissues.   Avoid the sun and limit stress if these things trigger outbreaks. If sun causes cold sores, apply sunscreen on the lips before being out in the sun.  SEEK MEDICAL CARE IF:   You have a fever or persistent symptoms for more than 2-3 days.   You have a fever and your symptoms suddenly get worse.   You have pus, not clear fluid, coming from the sores.   You have redness that is spreading.   You have pain or irritation in your eye.   You get sores on your genitals.   Your sores do not heal within 2 weeks.   You have a weakened immune system.   You have frequent recurrences of cold sores.    MAKE SURE YOU   Understand these instructions.  Will watch your condition.  Will get help right away if you are not doing well or get worse.  Your e-visit answers were reviewed by a board certified advanced clinical practitioner to complete your   personal care plan.  Depending on the condition, your plan could have included both over the counter or prescription medications.  If there is a problem please reply  once you have received a response from your provider.  Your safety is important to us.  If you have drug allergies check your prescription carefully.    You can use MyChart to ask questions about today's visit, request a non-urgent call back, or ask for a work or school excuse.  You will get an e-mail in the next two days asking about your experience.  I hope that your e-visit has been valuable and will speed your recovery. Thank you for using e-visits.     

## 2019-10-25 ENCOUNTER — Ambulatory Visit: Payer: Self-pay | Admitting: Nurse Practitioner

## 2019-11-02 ENCOUNTER — Encounter: Payer: Self-pay | Admitting: Nurse Practitioner

## 2019-11-03 ENCOUNTER — Ambulatory Visit (INDEPENDENT_AMBULATORY_CARE_PROVIDER_SITE_OTHER): Payer: No Typology Code available for payment source | Admitting: Nurse Practitioner

## 2019-11-03 ENCOUNTER — Encounter: Payer: Self-pay | Admitting: Nurse Practitioner

## 2019-11-03 ENCOUNTER — Other Ambulatory Visit: Payer: Self-pay

## 2019-11-03 ENCOUNTER — Telehealth: Payer: Self-pay

## 2019-11-03 VITALS — BP 122/80 | HR 84 | Temp 97.3°F | Ht 69.6 in | Wt 285.0 lb

## 2019-11-03 DIAGNOSIS — G43009 Migraine without aura, not intractable, without status migrainosus: Secondary | ICD-10-CM | POA: Diagnosis not present

## 2019-11-03 DIAGNOSIS — H9201 Otalgia, right ear: Secondary | ICD-10-CM | POA: Diagnosis not present

## 2019-11-03 DIAGNOSIS — B009 Herpesviral infection, unspecified: Secondary | ICD-10-CM

## 2019-11-03 DIAGNOSIS — Z6841 Body Mass Index (BMI) 40.0 and over, adult: Secondary | ICD-10-CM

## 2019-11-03 DIAGNOSIS — B002 Herpesviral gingivostomatitis and pharyngotonsillitis: Secondary | ICD-10-CM

## 2019-11-03 MED ORDER — VALACYCLOVIR HCL 1 G PO TABS: 2000.0000 mg | ORAL_TABLET | Freq: Two times a day (BID) | ORAL | 1 refills | Status: DC

## 2019-11-03 MED ORDER — AJOVY 225 MG/1.5ML ~~LOC~~ SOAJ: 225.0000 mg | SUBCUTANEOUS | 5 refills | Status: DC

## 2019-11-03 MED ORDER — UBRELVY 50 MG PO TABS: 1.0000 | ORAL_TABLET | Freq: Every day | ORAL | 2 refills | Status: DC

## 2019-11-03 NOTE — Progress Notes (Signed)
Subjective:     Patient ID: Audrey [name] , female    DOB: 1986/06/16 , 33 y.o.   MRN: [id number]   Chief Complaint  Patient presents with  . Migraine    patient stated she doesnt feel the medication is working. she has been having to take exercedrin.    HPI  She is drinking  She is walking a trail two times a week  Wt Readings from Last 3 Encounters: 11/03/19 : 285 lb (129.3 kg) 09/20/19 : 283 lb 12.8 oz (128.7 kg) 08/17/19 : 283 lb (128.4 kg)  She has tried [employer], tried exercising.     Migraine  This is a chronic problem. The current episode started more than 1 year ago. The problem occurs constantly. The pain is located in the bilateral region. The pain is at a severity of 7/10. Associated symptoms include ear pain (right ear pain). Pertinent negatives include no abdominal pain, coughing, dizziness, sinus pressure or sore throat. She has tried nothing for the symptoms. There is no history of migraine headaches.     Past Medical History:  Diagnosis Date  . Common migraine with intractable migraine 01/23/2017  . Diabetes mellitus without complication ([place])   . Fetal demise 05/19/2013  . GERD (gastroesophageal reflux disease)    with pregnancy  . Gestational diabetes   . Headache    migraines  . Hemorrhage after delivery of fetus 08/12/2006  . Morbidly obese ([place]) 01/23/2017  . Preeclampsia   . Pregnancy induced hypertension   . S/P cesarean section 05/20/2013  . S/P cesarean section 11/18/2014     Family History  Adopted: Yes  Problem Relation Age of Onset  . Diabetes Mother   . Asthma Other   . Hyperlipidemia Other   . Obesity Other      Current Outpatient Medications:  .  aspirin-acetaminophen-caffeine (EXCEDRIN MIGRAINE) 250-250-65 MG tablet, Take 2 tablets by mouth every 8 (eight) hours as needed for headache., Disp: , Rfl:  .  Erenumab-aooe (AIMOVIG) 140 MG/ML SOAJ, Inject 140 mg into the skin every 30 (thirty) days., Disp: 3 pen, Rfl: 1 .  ferrous  sulfate 325 (65 FE) MG tablet, Take 1 tablet (325 mg total) by mouth daily., Disp: 30 tablet, Rfl: 3 .  ibuprofen (ADVIL) 200 MG tablet, Take 200 mg by mouth every 6 (six) hours as needed., Disp: , Rfl:  .  valACYclovir (VALTREX) 1000 MG tablet, Take 2 tablets (2,000 mg total) by mouth 2 (two) times daily. (Patient not taking: Reported on 11/03/2019), Disp: 4 tablet, Rfl: 1   Allergies  Allergen Reactions  . Imitrex [Sumatriptan]     Nausea     Review of Systems  Constitutional: Negative.   HENT: Positive for ear pain (right ear pain). Negative for congestion, sinus pressure, sinus pain and sore throat.        Right ear pain and feelings of discomfort when swallowing    Respiratory: Negative for cough.   Gastrointestinal: Negative for abdominal pain.  Neurological: Negative for dizziness and headaches.  Psychiatric/Behavioral: Negative.      Today's Vitals   11/03/19 1205  BP: 122/80  Pulse: 84  Temp: (!) 97.3 F (36.3 C)  TempSrc: Oral  Weight: 285 lb (129.3 kg)  Height: 5' 9.6" (1.768 m)  PainSc: 8   PainLoc: Head   Body mass index is 41.36 kg/m.   Objective:  Physical Exam Vitals signs reviewed.  Constitutional:      Appearance: Normal appearance.  HENT:  Right Ear: Tympanic membrane, ear canal and external ear normal. There is no impacted cerumen.     Left Ear: Tympanic membrane, ear canal and external ear normal. There is no impacted cerumen.     Nose: No congestion or rhinorrhea.     Mouth/Throat:     Mouth: Mucous membranes are moist.  Cardiovascular:     Rate and Rhythm: Normal rate and regular rhythm.     Pulses: Normal pulses.     Heart sounds: Normal heart sounds. No murmur.  Pulmonary:     Effort: Pulmonary effort is normal. No respiratory distress.     Breath sounds: Normal breath sounds.  Skin:    General: Skin is warm and dry.     Capillary Refill: Capillary refill takes less than 2 seconds.     Findings: Rash (crease of lips clustered vesicles  ) present.  Neurological:     General: No focal deficit present.     Mental Status: She is alert and oriented to person, place, and time.  Psychiatric:        Mood and Affect: Mood normal.        Behavior: Behavior normal.        Thought Content: Thought content normal.        Judgment: Judgment normal.         Assessment And Plan:     1. Migraine without aura and without status migrainosus, not intractable  The Aimovig was not effective will try her again on Ajovy and see if insurance will cover  She has an allergy to sumatriptan  Encouraged to increase physical activity as well this can affect headaches - Fremanezumab-vfrm (AJOVY) 225 MG/1.5ML SOAJ; Inject 225 mg into the skin every 30 (thirty) days.  Dispense: 1 pen; Refill: 5 - Ubrogepant (UBRELVY) 50 MG TABS; Take 1 tablet by mouth daily.  Dispense: 30 tablet; Refill: 2  2. Class 3 severe obesity with body mass index (BMI) of 40.0 to 44.9 in adult, unspecified obesity type, unspecified whether serious comorbidity present (HCC)  I will check her insulin level to check for resistance which can affect her ability to lose weight  Pending results will consider starting her on metformin or rybelsus for better glucose/insulin control - Insulin, random([phone])  3. Right ear pain  No abnormal finding to her ear  Advised to use over the counter pain ear drops  4. Oral herpes simplex infection  She has few clustered vesicles crease of her lips will treat for cold sores for another round - valACYclovir (VALTREX) 1000 MG tablet; Take 2 tablets (2,000 mg total) by mouth 2 (two) times daily.  Dispense: 10 tablet; Refill: 1   Arnette Felts, FNP    THE PATIENT IS ENCOURAGED TO PRACTICE SOCIAL DISTANCING DUE TO THE COVID-19 PANDEMIC.

## 2019-11-03 NOTE — Telephone Encounter (Signed)
I have submitted the PA for ajovy on covermymeds and we are just waiting on the determination from her ins. YRL,RMA

## 2019-11-05 LAB — INSULIN, RANDOM: INSULIN: 66.7 u[IU]/mL — ABNORMAL HIGH (ref 2.6–24.9)

## 2019-11-08 ENCOUNTER — Ambulatory Visit: Payer: Self-pay | Admitting: Nurse Practitioner

## 2019-11-09 ENCOUNTER — Telehealth: Payer: Self-pay

## 2019-11-09 NOTE — Telephone Encounter (Signed)
PA for Roselyn Meier was submitted through covermymeds and we are just waiting on the determination from ins. YRL,RMA

## 2019-11-14 ENCOUNTER — Other Ambulatory Visit: Payer: Self-pay | Admitting: Family

## 2019-11-14 NOTE — Progress Notes (Signed)
Approximately 5 minutes was spent documenting and reviewing patient's chart.   

## 2019-11-15 ENCOUNTER — Other Ambulatory Visit: Payer: Self-pay

## 2019-11-15 ENCOUNTER — Encounter: Payer: Self-pay | Admitting: Orthopaedic Surgery

## 2019-11-15 ENCOUNTER — Ambulatory Visit (INDEPENDENT_AMBULATORY_CARE_PROVIDER_SITE_OTHER): Payer: No Typology Code available for payment source | Admitting: Orthopaedic Surgery

## 2019-11-15 ENCOUNTER — Telehealth: Payer: Self-pay

## 2019-11-15 VITALS — BP 125/81 | HR 85 | Ht 67.5 in | Wt 286.0 lb

## 2019-11-15 DIAGNOSIS — M25561 Pain in right knee: Secondary | ICD-10-CM | POA: Diagnosis not present

## 2019-11-15 DIAGNOSIS — G8929 Other chronic pain: Secondary | ICD-10-CM

## 2019-11-15 MED ORDER — BUPIVACAINE HCL 0.5 % IJ SOLN
2.0000 mL | INTRAMUSCULAR | Status: AC | PRN
  Administered 2019-11-15: 2 mL via INTRA_ARTICULAR

## 2019-11-15 MED ORDER — LIDOCAINE HCL 1 % IJ SOLN
2.0000 mL | INTRAMUSCULAR | Status: AC | PRN
  Administered 2019-11-15: 17:00:00 2 mL

## 2019-11-15 MED ORDER — METHYLPREDNISOLONE ACETATE 40 MG/ML IJ SUSP
80.0000 mg | INTRAMUSCULAR | Status: AC | PRN
  Administered 2019-11-15: 80 mg via INTRA_ARTICULAR

## 2019-11-15 NOTE — Telephone Encounter (Signed)
The purpose for checking her insulin levels was to see if she is resistant to insulin, this affects people with losing weight.  If she is not interested in the medication I recommend Rybelsus then she can see if her insurance will cover Saxenda which is an injection once daily and helps to control your blood sugar.

## 2019-11-15 NOTE — Telephone Encounter (Signed)
Patient notified she is coming to the office to pick up sample. YRL,RMA

## 2019-11-15 NOTE — Progress Notes (Signed)
Office Visit Note   Patient: Audrey [name]           Date of Birth: 02-25-1986           MRN: [id number] Visit Date: 11/15/2019              Requested by: Minette Brine, [address] [place] [place],  [place] [address] PCP: Minette Brine, FNP   Assessment & Plan: Visit Diagnoses:  1. Chronic pain of right knee     Plan: Recurrent symptoms of osteoarthritis right knee.  Will inject with cortisone and monitor response.  We will see back as needed.  Consider MRI scan if continues to have mechanical symptoms  Follow-Up Instructions: Return if symptoms worsen or fail to improve.   Orders:  Orders Placed This Encounter  Procedures  . Large Joint Inj: R knee   No orders of the defined types were placed in this encounter.     Procedures: Large Joint Inj: R knee on 11/15/2019 4:55 PM Indications: pain and diagnostic evaluation Details: 25 G 1.5 in needle, anteromedial approach  Arthrogram: No  Medications: 2 mL lidocaine 1 %; 2 mL bupivacaine 0.5 %; 80 mg methylPREDNISolone acetate 40 MG/ML Procedure, treatment alternatives, risks and benefits explained, specific risks discussed. Consent was given by the patient. Immediately prior to procedure a time out was called to verify the correct patient, procedure, equipment, support staff and site/side marked as required. Patient was prepped and draped in the usual sterile fashion.       Clinical Data: No additional findings.   Subjective: Chief Complaint  Patient presents with  . Right Knee - Pain  Patient presents today for right leg pain. She said that she was playing with her children a month ago and felt something pop in her posterior thigh. She said that she been having a difficult time sitting and applying pressure to that area. She said that her injection in her right knee in August has worn off, but was helpful. She is taking Ibuprofen as needed.   HPI  Review of Systems  Constitutional: Negative for  fatigue.  HENT: Negative for ear pain.   Eyes: Negative for pain.  Respiratory: Negative for shortness of breath.   Cardiovascular: Negative for leg swelling.  Gastrointestinal: Negative for constipation and diarrhea.  Endocrine: Negative for cold intolerance and heat intolerance.  Genitourinary: Negative for difficulty urinating.  Musculoskeletal: Positive for joint swelling.  Skin: Negative for rash.  Allergic/Immunologic: Negative for food allergies.  Neurological: Negative for weakness.  Hematological: Does not bruise/bleed easily.  Psychiatric/Behavioral: Negative for sleep disturbance.     Objective: Vital Signs: BP 125/81   Pulse 85   Ht 5' 7.5" (1.715 m)   Wt 286 lb (129.7 kg)   LMP 10/14/2019   BMI 44.13 kg/m   Physical Exam Constitutional:      Appearance: She is well-developed.  Eyes:     Pupils: Pupils are equal, round, and reactive to light.  Pulmonary:     Effort: Pulmonary effort is normal.  Skin:    General: Skin is warm and dry.  Neurological:     Mental Status: She is alert and oriented to person, place, and time.  Psychiatric:        Behavior: Behavior normal.     Ortho Exam awake alert and oriented x3.  Comfortable sitting.  Predominately medial joint pain right knee.  May be very minimal effusion.  Full extension of flexed over 105 degrees without instability.  No popliteal pain or mass.  No calf pain  Specialty Comments:  No specialty comments available.  Imaging: No results found.   PMFS History: Patient Active Problem List   Diagnosis Date Noted  . Dizziness 08/16/2019  . Cephalalgia 08/16/2019  . Class 3 severe obesity with body mass index (BMI) of 40.0 to 44.9 in adult [facility]) 01/27/2019  . Chronic pain of right knee 01/27/2019  . History of gestational diabetes 01/27/2019  . Breast lump on left side at 3 o'clock position 01/27/2019  . Cesarean delivery delivered 01/26/2019  . Gastroesophageal reflux disease 01/26/2019  . Morbidly  obese (HCC) 01/23/2017  . Common migraine with intractable migraine 01/23/2017  . S/P cesarean section 11/18/2014  . Fetal heart rate nonreactive   . [redacted] weeks gestation of pregnancy   . Non-reactive NST (non-stress test) 11/15/2014  . Migraine 07/12/2014   Past Medical History:  Diagnosis Date  . Common migraine with intractable migraine 01/23/2017  . Diabetes mellitus without complication (HCC)   . Fetal demise 05/19/2013  . GERD (gastroesophageal reflux disease)    with pregnancy  . Gestational diabetes   . Headache    migraines  . Hemorrhage after delivery of fetus 08/12/2006  . Morbidly obese (HCC) 01/23/2017  . Preeclampsia   . Pregnancy induced hypertension   . S/P cesarean section 05/20/2013  . S/P cesarean section 11/18/2014    Family History  Adopted: Yes  Problem Relation Age of Onset  . Diabetes Mother   . Asthma Other   . Hyperlipidemia Other   . Obesity Other     Past Surgical History:  Procedure Laterality Date  . CESAREAN SECTION    . CESAREAN SECTION N/A 05/19/2013   Procedure: CESAREAN SECTION;  Surgeon: Sherron Monday, MD;  Location: [facility];  Service: Obstetrics;  Laterality: N/A;  . CESAREAN SECTION WITH BILATERAL TUBAL LIGATION Bilateral 11/17/2014   Procedure: CESAREAN SECTION WITH BILATERAL TUBAL LIGATION;  Surgeon: Sherian Rein, MD;  Location: [facility];  Service: Obstetrics;  Laterality: Bilateral;  . TOENAIL EXCISION    . WISDOM TOOTH EXTRACTION     Social History   Occupational History    Employer: [employer]  Tobacco Use  . Smoking status: Never Smoker  . Smokeless tobacco: Never Used  Substance and Sexual Activity  . Alcohol use: No  . Drug use: No  . Sexual activity: Not Currently

## 2019-11-15 NOTE — Telephone Encounter (Signed)
What type of medication are you wanting to put her on?  What exactly is insulin resistant?  You had talked to about weight loss with her but said you wanted to wait on given her medicine so she wanted to know where you are with that? YRL,RMA

## 2019-11-15 NOTE — Telephone Encounter (Signed)
Patient and pharmacy both made aware that her PA for Audrey [name] has been approved. YRL,RMA

## 2019-12-01 ENCOUNTER — Encounter: Payer: Self-pay | Admitting: Nurse Practitioner

## 2019-12-05 ENCOUNTER — Encounter: Payer: Self-pay | Admitting: Nurse Practitioner

## 2019-12-05 ENCOUNTER — Encounter: Payer: No Typology Code available for payment source | Admitting: Nurse Practitioner

## 2019-12-05 ENCOUNTER — Other Ambulatory Visit: Payer: Self-pay

## 2019-12-09 ENCOUNTER — Encounter: Payer: Self-pay | Admitting: Nurse Practitioner

## 2019-12-11 ENCOUNTER — Other Ambulatory Visit: Payer: Self-pay

## 2019-12-11 ENCOUNTER — Emergency Department (HOSPITAL_COMMUNITY): Payer: No Typology Code available for payment source

## 2019-12-11 ENCOUNTER — Emergency Department (HOSPITAL_COMMUNITY)
Admission: EM | Admit: 2019-12-11 | Discharge: 2019-12-12 | Disposition: A | Discharge status: Home / Self Care | Payer: No Typology Code available for payment source | Attending: Emergency Medicine | Admitting: Emergency Medicine

## 2019-12-11 ENCOUNTER — Encounter (HOSPITAL_COMMUNITY): Payer: Self-pay | Admitting: *Deleted

## 2019-12-11 DIAGNOSIS — R0789 Other chest pain: Secondary | ICD-10-CM

## 2019-12-11 DIAGNOSIS — Z79899 Other long term (current) drug therapy: Secondary | ICD-10-CM | POA: Insufficient documentation

## 2019-12-11 DIAGNOSIS — E119 Type 2 diabetes mellitus without complications: Secondary | ICD-10-CM | POA: Insufficient documentation

## 2019-12-11 LAB — CBC
HCT: 38.1 % (ref 36.0–46.0)
Hemoglobin: 11.1 g/dL — ABNORMAL LOW (ref 12.0–15.0)
MCH: 24.1 pg — ABNORMAL LOW (ref 26.0–34.0)
MCHC: 29.1 g/dL — ABNORMAL LOW (ref 30.0–36.0)
MCV: 82.6 fL (ref 80.0–100.0)
Platelets: 315 10*3/uL (ref 150–400)
RBC: 4.61 MIL/uL (ref 3.87–5.11)
RDW: 17.9 % — ABNORMAL HIGH (ref 11.5–15.5)
WBC: 16.2 10*3/uL — ABNORMAL HIGH (ref 4.0–10.5)
nRBC: 0 % (ref 0.0–0.2)

## 2019-12-11 LAB — BASIC METABOLIC PANEL
Anion gap: 10 (ref 5–15)
BUN: 10 mg/dL (ref 6–20)
CO2: 22 mmol/L (ref 22–32)
Calcium: 8.9 mg/dL (ref 8.9–10.3)
Chloride: 106 mmol/L (ref 98–111)
Creatinine, Ser: 0.77 mg/dL (ref 0.44–1.00)
GFR calc Af Amer: >60 mL/min (ref >60)
GFR calc non Af Amer: >60 mL/min (ref >60)
Glucose, Bld: 94 mg/dL (ref 70–99)
Potassium: 3.9 mmol/L (ref 3.5–5.1)
Sodium: 138 mmol/L (ref 135–145)

## 2019-12-11 LAB — TROPONIN I (HIGH SENSITIVITY): Troponin I (High Sensitivity): <2 ng/L (ref <18)

## 2019-12-11 LAB — I-STAT BETA HCG BLOOD, ED (MC, WL, AP ONLY): I-stat hCG, quantitative: <5 m[IU]/mL (ref <5)

## 2019-12-11 MED ORDER — SODIUM CHLORIDE 0.9% FLUSH: 3.0000 mL | Freq: Once | INTRAVENOUS | Status: DC

## 2019-12-11 NOTE — ED Triage Notes (Signed)
The pt is c/o lt upper chest pain lt arm and lt neck with dome sob with exertion bi-lateral leg cramps   Especially at night    lmp one week ago

## 2019-12-12 ENCOUNTER — Other Ambulatory Visit: Payer: Self-pay

## 2019-12-12 ENCOUNTER — Telehealth: Payer: Self-pay

## 2019-12-12 MED ORDER — NAPROXEN 500 MG PO TABS: 500.0000 mg | ORAL_TABLET | Freq: Two times a day (BID) | ORAL | 0 refills | Status: DC | PRN

## 2019-12-12 NOTE — ED Notes (Signed)
ED Provider at bedside. 

## 2019-12-12 NOTE — Discharge Instructions (Addendum)
Your work-up in the emergency department today has been reassuring.  We recommend naproxen as prescribed for management of pain.  You may also use topical Lidoderm patches which can be purchased over-the-counter at your local pharmacy.  Continue follow-up with your primary care doctor.  You may return for new or concerning symptoms.

## 2019-12-12 NOTE — ED Notes (Signed)
Discharge instructions discussed with pt. Pt verbalized understanding with no questions at this time.  

## 2019-12-12 NOTE — ED Provider Notes (Signed)
[facility] EMERGENCY DEPARTMENT Provider Note   CSN: [id number] Arrival date & time: 12/11/19  2140     History No chief complaint on file.   Audrey [name] is a 33 y.o. female.   The history is provided by the patient. No language interpreter was used.  Chest Pain Pain location:  L chest Pain radiates to:  L arm Pain severity:  Moderate Duration: present for a number of weeks. Timing:  Intermittent Progression:  Waxing and waning Relieved by:  Nothing Associated symptoms: cough and shortness of breath   Risk factors: diabetes mellitus and obesity   Risk factors: no coronary artery disease, no hypertension, no prior DVT/PE and no smoking        Past Medical History:  Diagnosis Date  . Common migraine with intractable migraine 01/23/2017  . Diabetes mellitus without complication (HCC)   . Fetal demise 05/19/2013  . GERD (gastroesophageal reflux disease)    with pregnancy  . Gestational diabetes   . Headache    migraines  . Hemorrhage after delivery of fetus 08/12/2006  . Morbidly obese (HCC) 01/23/2017  . Preeclampsia   . Pregnancy induced hypertension   . S/P cesarean section 05/20/2013  . S/P cesarean section 11/18/2014    Patient Active Problem List   Diagnosis Date Noted  . Dizziness 08/16/2019  . Cephalalgia 08/16/2019  . Class 3 severe obesity with body mass index (BMI) of 40.0 to 44.9 in adult [facility]) 01/27/2019  . Chronic pain of right knee 01/27/2019  . History of gestational diabetes 01/27/2019  . Breast lump on left side at 3 o'clock position 01/27/2019  . Cesarean delivery delivered 01/26/2019  . Gastroesophageal reflux disease 01/26/2019  . Morbidly obese (HCC) 01/23/2017  . Common migraine with intractable migraine 01/23/2017  . S/P cesarean section 11/18/2014  . Fetal heart rate nonreactive   . [redacted] weeks gestation of pregnancy   . Non-reactive NST (non-stress test) 11/15/2014  . Migraine 07/12/2014    Past Surgical  History:  Procedure Laterality Date  . CESAREAN SECTION    . CESAREAN SECTION N/A 05/19/2013   Procedure: CESAREAN SECTION;  Surgeon: Sherron Monday, MD;  Location: [facility];  Service: Obstetrics;  Laterality: N/A;  . CESAREAN SECTION WITH BILATERAL TUBAL LIGATION Bilateral 11/17/2014   Procedure: CESAREAN SECTION WITH BILATERAL TUBAL LIGATION;  Surgeon: Sherian Rein, MD;  Location: [facility];  Service: Obstetrics;  Laterality: Bilateral;  . TOENAIL EXCISION    . WISDOM TOOTH EXTRACTION       OB History    Gravida  3   Para  3   Term  3   Preterm  0   AB  0   Living  2     SAB  0   TAB  0   Ectopic  0   Multiple  0   Live Births  2           Family History  Adopted: Yes  Problem Relation Age of Onset  . Diabetes Mother   . Asthma Other   . Hyperlipidemia Other   . Obesity Other     Social History   Tobacco Use  . Smoking status: Never Smoker  . Smokeless tobacco: Never Used  Substance Use Topics  . Alcohol use: No  . Drug use: No    Home Medications Prior to Admission medications   Medication Sig Start Date End Date Taking? Authorizing Provider  aspirin-acetaminophen-caffeine (EXCEDRIN MIGRAINE) [phone] MG tablet Take 2 tablets by  mouth every 8 (eight) hours as needed for headache.    [provider]  Erenumab-aooe (AIMOVIG) 140 MG/ML SOAJ Inject 140 mg into the skin every 30 (thirty) days. 09/28/19   Minette Brine, FNP  ferrous sulfate 325 (65 FE) MG tablet Take 1 tablet (325 mg total) by mouth daily. 08/22/19 08/21/20  Minette Brine, FNP  Fremanezumab-vfrm (AJOVY) 225 MG/1.5ML SOAJ Inject 225 mg into the skin every 30 (thirty) days. 11/03/19   Minette Brine, FNP  ibuprofen (ADVIL) 200 MG tablet Take 200 mg by mouth every 6 (six) hours as needed.    [provider]  naproxen (NAPROSYN) 500 MG tablet Take 1 tablet (500 mg total) by mouth every 12 (twelve) hours as needed for mild pain or moderate pain. 12/12/19   Antonietta Breach, PA-C    Ubrogepant (UBRELVY) 50 MG TABS Take 1 tablet by mouth daily. 11/03/19   Minette Brine, FNP  valACYclovir (VALTREX) 1000 MG tablet Take 2 tablets (2,000 mg total) by mouth 2 (two) times daily. Patient not taking: Reported on 11/15/2019 11/03/19   Minette Brine, FNP    Allergies    Imitrex [sumatriptan]  Review of Systems   Review of Systems  Respiratory: Positive for cough and shortness of breath.   Cardiovascular: Positive for chest pain.  Ten systems reviewed and are negative for acute change, except as noted in the HPI.    Physical Exam Updated Vital Signs BP 113/71   Pulse 64   Temp 98.8 F (37.1 C) (Oral)   Resp (!) 23   Ht 5' 7.5" (1.715 m)   Wt 124.3 kg   LMP 12/04/2019   SpO2 97%   BMI 42.28 kg/m   Physical Exam Vitals and nursing note reviewed.  Constitutional:      General: She is not in acute distress.    Appearance: She is well-developed. She is not diaphoretic.     Comments: Nontoxic appearing and in NAD. Obese AA female.  HENT:     Head: Normocephalic and atraumatic.  Eyes:     General: No scleral icterus.    Conjunctiva/sclera: Conjunctivae normal.  Cardiovascular:     Rate and Rhythm: Normal rate and regular rhythm.     Pulses: Normal pulses.  Pulmonary:     Effort: Pulmonary effort is normal. No respiratory distress.     Breath sounds: No stridor. No wheezing, rhonchi or rales.     Comments: Lungs CTAB. Respirations even and unlabored. TTP to the left upper chest wall without crepitus. Chest:     Chest wall: Tenderness present.  Musculoskeletal:        General: Normal range of motion.     Cervical back: Normal range of motion.  Skin:    General: Skin is warm and dry.     Coloration: Skin is not pale.     Findings: No erythema or rash.  Neurological:     Mental Status: She is alert and oriented to person, place, and time.     Coordination: Coordination normal.  Psychiatric:        Behavior: Behavior normal.     ED Results / Procedures /  Treatments   Labs (all labs ordered are listed, but only abnormal results are displayed) Labs Reviewed  CBC - Abnormal; Notable for the following components:      Result Value   WBC 16.2 (*)    Hemoglobin 11.1 (*)    MCH 24.1 (*)    MCHC 29.1 (*)    RDW 17.9 (*)  All other components within normal limits  BASIC METABOLIC PANEL  I-STAT BETA HCG BLOOD, ED ([facility], [facility], [facility] ONLY)  TROPONIN I (HIGH SENSITIVITY)    EKG EKG Interpretation  Date/Time:  Sunday December 11 2019 21:59:20 EST Ventricular Rate:  94 PR Interval:  150 QRS Duration: 82 QT Interval:  352 QTC Calculation: 440 R Axis:   -9 Text Interpretation: Normal sinus rhythm Normal ECG No significant change since last tracing Confirmed by Floyd, Dan ([id number]) on 12/12/2019 3:19:06 AM   Radiology DG Chest 2 View  Result Date: 12/11/2019 CLINICAL DATA:  Initial evaluation for acute left upper chest pain with left neck and arm pain for 3 days. EXAM: CHEST - 2 VIEW COMPARISON:  Prior radiograph from 12/11/2019. FINDINGS: The cardiac and mediastinal silhouettes are stable in size and contour, and remain within normal limits. The lungs are normally inflated. No airspace consolidation, pleural effusion, or pulmonary edema. No pneumothorax. No acute osseous abnormality. IMPRESSION: No active cardiopulmonary disease. Electronically Signed   By: Benjamin  McClintock M.D.   On: 12/11/2019 22:17    Procedures Procedures (including critical care time)  Medications Ordered in ED Medications - No data to display  ED Course  I have reviewed the triage vital signs and the nursing notes.  Pertinent labs & imaging results that were available during my care of the patient were reviewed by me and considered in my medical decision making (see chart for details).    MDM Rules/Calculators/A&P   Patient presents to the emergency department for evaluation of chest pain.  Symptoms have been ongoing over the past few weeks.  EKG is nonischemic,  stable and troponin negative.  Chest x-ray without evidence of mediastinal widening to suggest dissection.  No pneumothorax, pneumonia, pleural effusion.  Pulmonary embolus further considered; however, patient without sustained tachycardia, tachypnea, dyspnea, hypoxia.  Given reproducibility of pain with palpation to the chest wall, suspect musculoskeletal etiology.  Advised use of naproxen for symptom control as well as outpatient primary care follow-up.  Do not feel further emergent work-up is indicated at this time.  Return precautions discussed and provided.  Patient discharged in stable condition with no unaddressed concerns.   Final Clinical Impression(s) / ED Diagnoses Final diagnoses:  Chest wall pain    Rx / DC Orders ED Discharge Orders         Ordered    naproxen (NAPROSYN) 500 MG tablet  Every 12 hours PRN     12 /14/20 0327           Antony MaduraHumes, Keeya Dyckman, PA-C 12/20/19 2220    Melene PlanFloyd, Dan, DO 12/21/19 [id number]

## 2019-12-12 NOTE — ED Notes (Signed)
RN notification of discontinued blood draw per provider at bedside.

## 2019-12-12 NOTE — Telephone Encounter (Signed)
I left the pt a message that I was returning her call to schedule her an appt. 

## 2020-01-02 ENCOUNTER — Encounter: Payer: Self-pay | Admitting: Nurse Practitioner

## 2020-01-02 ENCOUNTER — Other Ambulatory Visit: Payer: Self-pay

## 2020-01-02 ENCOUNTER — Other Ambulatory Visit: Payer: Self-pay | Admitting: Nurse Practitioner

## 2020-01-03 ENCOUNTER — Other Ambulatory Visit: Payer: Self-pay

## 2020-01-03 DIAGNOSIS — E8881 Metabolic syndrome: Secondary | ICD-10-CM

## 2020-01-03 MED ORDER — RYBELSUS 7 MG PO TABS: 1.0000 | ORAL_TABLET | Freq: Every day | ORAL | 1 refills | Status: DC

## 2020-01-03 MED ORDER — TRIAMCINOLONE ACETONIDE 55 MCG/ACT NA AERO: 1.0000 | INHALATION_SPRAY | Freq: Two times a day (BID) | NASAL | 2 refills | Status: DC

## 2020-01-09 ENCOUNTER — Encounter: Payer: Self-pay | Admitting: Nurse Practitioner

## 2020-02-26 NOTE — Progress Notes (Signed)
Not seen

## 2020-03-06 ENCOUNTER — Encounter: Payer: Self-pay | Admitting: Orthopaedic Surgery

## 2020-03-13 ENCOUNTER — Ambulatory Visit: Payer: No Typology Code available for payment source | Admitting: Orthopaedic Surgery

## 2020-03-13 ENCOUNTER — Encounter: Payer: Self-pay | Admitting: Orthopaedic Surgery

## 2020-03-13 ENCOUNTER — Other Ambulatory Visit: Payer: Self-pay

## 2020-03-13 VITALS — Ht 67.5 in | Wt 274.0 lb

## 2020-03-13 DIAGNOSIS — G8929 Other chronic pain: Secondary | ICD-10-CM | POA: Diagnosis not present

## 2020-03-13 DIAGNOSIS — M25561 Pain in right knee: Secondary | ICD-10-CM

## 2020-03-13 MED ORDER — METHYLPREDNISOLONE ACETATE 40 MG/ML IJ SUSP
80.0000 mg | INTRAMUSCULAR | Status: AC | PRN
  Administered 2020-03-13: 80 mg via INTRA_ARTICULAR

## 2020-03-13 MED ORDER — LIDOCAINE HCL 1 % IJ SOLN
2.0000 mL | INTRAMUSCULAR | Status: AC | PRN
  Administered 2020-03-13: 2 mL

## 2020-03-13 MED ORDER — BUPIVACAINE HCL 0.5 % IJ SOLN
2.0000 mL | INTRAMUSCULAR | Status: AC | PRN
  Administered 2020-03-13: 2 mL via INTRA_ARTICULAR

## 2020-03-13 NOTE — Progress Notes (Signed)
Office Visit Note   Patient: Audrey [name]           Date of Birth: May 14, 1986           MRN: [id number] Visit Date: 03/13/2020              Requested by: Minette Brine, [address] [place],  [address] [address] PCP: Minette Brine, FNP   Assessment & Plan: Visit Diagnoses:  1. Chronic pain of right knee     Plan: Osteoarthritis right knee per prior films.  Changes predominate the medial compartment.  Having recurrent symptoms of arthritis.  Good response to cortisone in the past.  Will inject with cortisone today  Follow-Up Instructions: Return if symptoms worsen or fail to improve.   Orders:  Orders Placed This Encounter  Procedures  . Large Joint Inj: R knee   No orders of the defined types were placed in this encounter.     Procedures: Large Joint Inj: R knee on 03/13/2020 4:15 PM Indications: pain and diagnostic evaluation Details: 25 G 1.5 in needle, anteromedial approach  Arthrogram: No  Medications: 2 mL lidocaine 1 %; 2 mL bupivacaine 0.5 %; 80 mg methylPREDNISolone acetate 40 MG/ML Procedure, treatment alternatives, risks and benefits explained, specific risks discussed. Consent was given by the patient. Immediately prior to procedure a time out was called to verify the correct patient, procedure, equipment, support staff and site/side marked as required. Patient was prepped and draped in the usual sterile fashion.       Clinical Data: No additional findings.   Subjective: Chief Complaint  Patient presents with  . Right Knee - Pain  Patient presents today for her recurrent right knee pain. She was last evaluated in November of 2020 and received a cortisone injection. Patient states that her knee started hurting two weeks ago. She has been taking Ibuprofen and applying Voltaren gel as needed. She is wanting to get another cortisone injection today.   HPI  Review of Systems   Objective: Vital Signs: Ht 5' 7.5" (1.715 m)   Wt 274 lb  (124.3 kg)   BMI 42.28 kg/m   Physical Exam Constitutional:      Appearance: She is well-developed.  Eyes:     Pupils: Pupils are equal, round, and reactive to light.  Pulmonary:     Effort: Pulmonary effort is normal.  Skin:    General: Skin is warm and dry.  Neurological:     Mental Status: She is alert and oriented to person, place, and time.  Psychiatric:        Behavior: Behavior normal.     Ortho Exam awake alert and oriented x3.  Comfortable sitting.  Right knee with predominant medial joint pain.  Little bit of patella crepitation but full extension of flexed over 105 degrees without instability.  The knee was not hot warm red or effused.  Specialty Comments:  No specialty comments available.  Imaging: No results found.   PMFS History: Patient Active Problem List   Diagnosis Date Noted  . Dizziness 08/16/2019  . Cephalalgia 08/16/2019  . Class 3 severe obesity with body mass index (BMI) of 40.0 to 44.9 in adult [facility]) 01/27/2019  . Chronic pain of right knee 01/27/2019  . History of gestational diabetes 01/27/2019  . Breast lump on left side at 3 o'clock position 01/27/2019  . Cesarean delivery delivered 01/26/2019  . Gastroesophageal reflux disease 01/26/2019  . Morbidly obese ([place]) 01/23/2017  . Common migraine with  intractable migraine 01/23/2017  . S/P cesarean section 11/18/2014  . Fetal heart rate nonreactive   . [redacted] weeks gestation of pregnancy   . Non-reactive NST (non-stress test) 11/15/2014  . Migraine 07/12/2014   Past Medical History:  Diagnosis Date  . Common migraine with intractable migraine 01/23/2017  . Diabetes mellitus without complication (HCC)   . Fetal demise 05/19/2013  . GERD (gastroesophageal reflux disease)    with pregnancy  . Gestational diabetes   . Headache    migraines  . Hemorrhage after delivery of fetus 08/12/2006  . Morbidly obese (HCC) 01/23/2017  . Preeclampsia   . Pregnancy induced hypertension   . S/P cesarean  section 05/20/2013  . S/P cesarean section 11/18/2014    Family History  Adopted: Yes  Problem Relation Age of Onset  . Diabetes Mother   . Asthma Other   . Hyperlipidemia Other   . Obesity Other     Past Surgical History:  Procedure Laterality Date  . CESAREAN SECTION    . CESAREAN SECTION N/A 05/19/2013   Procedure: CESAREAN SECTION;  Surgeon: Sherron Monday, MD;  Location: [facility];  Service: Obstetrics;  Laterality: N/A;  . CESAREAN SECTION WITH BILATERAL TUBAL LIGATION Bilateral 11/17/2014   Procedure: CESAREAN SECTION WITH BILATERAL TUBAL LIGATION;  Surgeon: Sherian Rein, MD;  Location: [facility];  Service: Obstetrics;  Laterality: Bilateral;  . TOENAIL EXCISION    . WISDOM TOOTH EXTRACTION     Social History   Occupational History    Employer: [employer]  Tobacco Use  . Smoking status: Never Smoker  . Smokeless tobacco: Never Used  Substance and Sexual Activity  . Alcohol use: No  . Drug use: No  . Sexual activity: Not Currently

## 2020-03-22 ENCOUNTER — Telehealth: Payer: No Typology Code available for payment source | Admitting: Emergency Medicine

## 2020-03-22 ENCOUNTER — Telehealth: Payer: No Typology Code available for payment source

## 2020-03-22 ENCOUNTER — Encounter (HOSPITAL_COMMUNITY): Payer: Self-pay

## 2020-03-22 DIAGNOSIS — H9202 Otalgia, left ear: Secondary | ICD-10-CM

## 2020-03-22 MED ORDER — CIPROFLOXACIN-DEXAMETHASONE 0.3-0.1 % OT SUSP: 4.0000 [drp] | Freq: Two times a day (BID) | OTIC | 0 refills | Status: DC

## 2020-03-22 NOTE — Progress Notes (Signed)
E Visit for Swimmer's Ear  We are sorry that you are not feeling well. Here is how we plan to help!  I have prescribed: Ciprodex otic suspension 4 drops in affected ear twice daily for 7 days    In certain cases swimmer's ear may progress to a more serious bacterial infection of the middle or inner ear.  If you have a fever 102 and up and significantly worsening symptoms, this could indicate a more serious infection moving to the middle/inner and needs face to face evaluation in an office by a provider.  Your symptoms should improve over the next 3 days and should resolve in about 7 days.  HOME CARE:   Wash your hands frequently.  Do not place the tip of the bottle on your ear or touch it with your fingers.  You can take Acetominophen 650 mg every 4-6 hours as needed for pain.  If pain is severe or moderate, you can apply a heating pad (set on low) or hot water bottle (wrapped in a towel) to outer ear for 20 minutes.  This will also increase drainage.  Avoid ear plugs  Do not use Q-tips  After showers, help the water run out by tilting your head to one side.  GET HELP RIGHT AWAY IF:   Fever is over 102.2 degrees.  You develop progressive ear pain or hearing loss.  Ear symptoms persist longer than 3 days after treatment.  MAKE SURE YOU:   Understand these instructions.  Will watch your condition.  Will get help right away if you are not doing well or get worse.  TO PREVENT SWIMMER'S EAR:  Use a bathing cap or custom fitted swim molds to keep your ears dry.  Towel off after swimming to dry your ears.  Tilt your head or pull your earlobes to allow the water to escape your ear canal.  If there is still water in your ears, consider using a hairdryer on the lowest setting.  Thank you for choosing an e-visit. Your e-visit answers were reviewed by a board certified advanced clinical practitioner to complete your personal care plan. Depending upon the condition, your  plan could have included both over the counter or prescription medications. Please review your pharmacy choice. Be sure that the pharmacy you have chosen is open so that you can pick up your prescription now.  If there is a problem you may message your provider in MyChart to have the prescription routed to another pharmacy. Your safety is important to [place]. If you have drug allergies check your prescription carefully.  For the next 24 hours, you can use MyChart to ask questions about today's visit, request a non-urgent call back, or ask for a work or school excuse from your e-visit provider. You will get an email in the next two days asking about your experience. I hope that your e-visit has been valuable and will speed your recovery.      Approximately 5 minutes was used in reviewing the patient's chart, questionnaire, prescribing medications, and documentation.

## 2020-03-27 ENCOUNTER — Encounter: Payer: Self-pay | Admitting: Nurse Practitioner

## 2020-03-29 ENCOUNTER — Ambulatory Visit: Payer: Self-pay | Admitting: Nurse Practitioner

## 2020-04-30 ENCOUNTER — Encounter: Payer: Self-pay | Admitting: Nurse Practitioner

## 2020-05-01 ENCOUNTER — Ambulatory Visit: Payer: Self-pay | Admitting: Nurse Practitioner

## 2020-05-03 ENCOUNTER — Other Ambulatory Visit: Payer: Self-pay

## 2020-05-03 ENCOUNTER — Encounter: Payer: Self-pay | Admitting: Nurse Practitioner

## 2020-05-03 ENCOUNTER — Ambulatory Visit (INDEPENDENT_AMBULATORY_CARE_PROVIDER_SITE_OTHER): Payer: No Typology Code available for payment source | Admitting: Nurse Practitioner

## 2020-05-03 VITALS — BP 122/80 | HR 89 | Temp 97.9°F | Ht 68.4 in | Wt 282.4 lb

## 2020-05-03 DIAGNOSIS — R42 Dizziness and giddiness: Secondary | ICD-10-CM

## 2020-05-03 DIAGNOSIS — R0789 Other chest pain: Secondary | ICD-10-CM | POA: Diagnosis not present

## 2020-05-03 DIAGNOSIS — K5900 Constipation, unspecified: Secondary | ICD-10-CM

## 2020-05-03 DIAGNOSIS — E66813 Obesity, class 3: Secondary | ICD-10-CM

## 2020-05-03 DIAGNOSIS — E8881 Metabolic syndrome: Secondary | ICD-10-CM

## 2020-05-03 DIAGNOSIS — R5383 Other fatigue: Secondary | ICD-10-CM

## 2020-05-03 DIAGNOSIS — H9202 Otalgia, left ear: Secondary | ICD-10-CM | POA: Diagnosis not present

## 2020-05-03 DIAGNOSIS — E88819 Insulin resistance, unspecified: Secondary | ICD-10-CM

## 2020-05-03 DIAGNOSIS — Z6841 Body Mass Index (BMI) 40.0 and over, adult: Secondary | ICD-10-CM

## 2020-05-03 NOTE — Patient Instructions (Signed)
J.JChilton Si Smoothie, liver focus vitamins

## 2020-05-03 NOTE — Progress Notes (Signed)
This visit occurred during the SARS-CoV-2 public health emergency.  Safety protocols were in place, including screening questions prior to the visit, additional usage of staff PPE, and extensive cleaning of exam room while observing appropriate contact time as indicated for disinfecting solutions.  Subjective:     Patient ID: Audrey [name] , female    DOB: Oct 31, 1986 , 34 y.o.   MRN: [id number]   Chief Complaint  Patient presents with  . Dizziness    patient stated she has been having some dizziness on and off when she has headaches  . Otalgia    HPI  Reports has been having ear pain for at least since November which has been persistent.   Wt Readings from Last 3 Encounters: 05/03/20 : 282 lb 6.4 oz (128.1 kg) 03/13/20 : 274 lb (124.3 kg) 12/12/19 : 274 lb (124.3 kg)    Dizziness This is a new problem. The current episode started in the past 7 days. The problem occurs intermittently. The problem has been gradually improving. Associated symptoms include headaches. Pertinent negatives include no abdominal pain, coughing, fever, nausea, sore throat, vertigo or weakness. Associated symptoms comments: Left ear pain - had ear drops in March which helped. . Treatments tried: allegra and nasal.  Otalgia  There is pain in the left ear. This is a new problem. The current episode started 1 to 4 weeks ago. The problem occurs constantly. The problem has been gradually worsening. There has been no fever. The patient is experiencing no pain. Associated symptoms include headaches. Pertinent negatives include no abdominal pain, coughing or sore throat. She has tried ear drops for the symptoms.     Past Medical History:  Diagnosis Date  . Common migraine with intractable migraine 01/23/2017  . Diabetes mellitus without complication (HCC)   . Fetal demise 05/19/2013  . GERD (gastroesophageal reflux disease)    with pregnancy  . Gestational diabetes   . Headache    migraines  . Hemorrhage after  delivery of fetus 08/12/2006  . Morbidly obese (HCC) 01/23/2017  . Preeclampsia   . Pregnancy induced hypertension   . S/P cesarean section 05/20/2013  . S/P cesarean section 11/18/2014     Family History  Adopted: Yes  Problem Relation Age of Onset  . Diabetes Mother   . Asthma Other   . Hyperlipidemia Other   . Obesity Other      Current Outpatient Medications:  .  aspirin-acetaminophen-caffeine (EXCEDRIN MIGRAINE) 250-250-65 MG tablet, Take 2 tablets by mouth every 8 (eight) hours as needed for headache., Disp: , Rfl:  .  Erenumab-aooe (AIMOVIG) 140 MG/ML SOAJ, Inject 140 mg into the skin every 30 (thirty) days., Disp: 3 pen, Rfl: 1 .  ferrous sulfate 325 (65 FE) MG tablet, Take 1 tablet (325 mg total) by mouth daily., Disp: 30 tablet, Rfl: 3 .  ibuprofen (ADVIL) 200 MG tablet, Take 200 mg by mouth every 6 (six) hours as needed., Disp: , Rfl:  .  Semaglutide (RYBELSUS) 7 MG TABS, Take 1 tablet by mouth daily., Disp: 60 tablet, Rfl: 1 .  triamcinolone (NASACORT) 55 MCG/ACT AERO nasal inhaler, Place 1 spray into the nose 2 (two) times daily., Disp: 1 Inhaler, Rfl: 2 .  Ubrogepant (UBRELVY) 50 MG TABS, Take 1 tablet by mouth daily., Disp: 30 tablet, Rfl: 2 .  naproxen (NAPROSYN) 500 MG tablet, Take 1 tablet (500 mg total) by mouth every 12 (twelve) hours as needed for mild pain or moderate pain. (Patient not taking: Reported on 05/03/2020),  Disp: 30 tablet, Rfl: 0 .  valACYclovir (VALTREX) 1000 MG tablet, Take 2 tablets (2,000 mg total) by mouth 2 (two) times daily. (Patient not taking: Reported on 05/03/2020), Disp: 10 tablet, Rfl: 1   Allergies  Allergen Reactions  . Imitrex [Sumatriptan]     Nausea     Review of Systems  Constitutional: Negative for fever.  HENT: Positive for ear pain. Negative for sore throat.   Respiratory: Negative for cough.   Gastrointestinal: Negative for abdominal pain and nausea.  Neurological: Positive for dizziness and headaches. Negative for vertigo  and weakness.  Psychiatric/Behavioral: Negative.      Today's Vitals   05/03/20 0851  BP: 122/80  Pulse: 89  Temp: 97.9 F (36.6 C)  Weight: 282 lb 6.4 oz (128.1 kg)  Height: 5' 8.4" (1.737 m)  PainSc: 0-No pain   Body mass index is 42.44 kg/m.   Objective:  Physical Exam Constitutional:      Appearance: Normal appearance.  Cardiovascular:     Rate and Rhythm: Normal rate and regular rhythm.     Pulses: Normal pulses.     Heart sounds: Normal heart sounds. No murmur.  Pulmonary:     Effort: Pulmonary effort is normal. No respiratory distress.     Breath sounds: Normal breath sounds.  Neurological:     General: No focal deficit present.     Mental Status: She is alert and oriented to person, place, and time.     Cranial Nerves: No cranial nerve deficit.  Psychiatric:        Mood and Affect: Mood normal.        Behavior: Behavior normal.        Thought Content: Thought content normal.        Judgment: Judgment normal.         Assessment And Plan:     1. Left ear pain  Her right ear pain has been persistent and the antibiotic was ineffective  Will refer to ENT for further evaluation - Ambulatory referral to ENT  2. Dizziness  Intermittent dizziness with her headaches  Encouraged to stay well hydrated with water and try to keep the headaches under control  3. Atypical chest pain  Intermittent discomfort has had EKG done previously   This is likely not cardiac related sounds musculoskeletal  4. Insulin resistance  Her insulin levels in December were elevated, will start her on Rybelsus and have encouraged her to avoid sugary foods and drinks.   5. Class 3 severe obesity with body mass index (BMI) of 40.0 to 44.9 in adult, unspecified obesity type, unspecified whether serious comorbidity present ([place])  Increase physical activity and eat a healthy diet low in sugar and carbs  Discussed different ways to work on exercise  This could be causing her  headaches as well  6. Fatigue, unspecified type  Will refer for sleep study due to chronic fatigue - Ambulatory referral to Sleep Studies  7. Constipation, unspecified constipation type  Increase fiber in diet and samples of linzess given if beneficial call to office for a prescription, this can also affect her weight loss as well.     Minette Brine, FNP    THE PATIENT IS ENCOURAGED TO PRACTICE SOCIAL DISTANCING DUE TO THE COVID-19 PANDEMIC.

## 2020-05-21 ENCOUNTER — Other Ambulatory Visit: Payer: Self-pay

## 2020-05-21 ENCOUNTER — Encounter: Payer: Self-pay | Admitting: Nurse Practitioner

## 2020-05-21 MED ORDER — AIMOVIG 140 MG/ML ~~LOC~~ SOAJ: 140.0000 mg | SUBCUTANEOUS | 1 refills | Status: DC

## 2020-05-21 MED FILL — AIMOVIG 140 MG/ML SOAJ: 140 | 30 days supply | Qty: 1 | Fill #0

## 2020-05-31 ENCOUNTER — Other Ambulatory Visit: Payer: Self-pay | Admitting: Nurse Practitioner

## 2020-05-31 ENCOUNTER — Other Ambulatory Visit: Payer: Self-pay

## 2020-05-31 ENCOUNTER — Encounter: Payer: Self-pay | Admitting: Nurse Practitioner

## 2020-05-31 ENCOUNTER — Ambulatory Visit (INDEPENDENT_AMBULATORY_CARE_PROVIDER_SITE_OTHER): Payer: No Typology Code available for payment source | Admitting: Nurse Practitioner

## 2020-05-31 VITALS — BP 124/76 | HR 73 | Temp 97.3°F | Ht 68.6 in | Wt 273.4 lb

## 2020-05-31 DIAGNOSIS — E8881 Metabolic syndrome: Secondary | ICD-10-CM | POA: Diagnosis not present

## 2020-05-31 DIAGNOSIS — Z6841 Body Mass Index (BMI) 40.0 and over, adult: Secondary | ICD-10-CM | POA: Diagnosis not present

## 2020-05-31 DIAGNOSIS — G43009 Migraine without aura, not intractable, without status migrainosus: Secondary | ICD-10-CM | POA: Diagnosis not present

## 2020-05-31 MED ORDER — RYBELSUS 14 MG PO TABS: ORAL_TABLET | ORAL | 3 refills | Status: DC

## 2020-05-31 MED ORDER — AJOVY 225 MG/1.5ML ~~LOC~~ SOAJ: 1.0000 "pen " | SUBCUTANEOUS | 3 refills | Status: DC

## 2020-05-31 MED FILL — RYBELSUS 14 MG TABS: 14 | 30 days supply | Qty: 30 | Fill #0

## 2020-05-31 MED FILL — AJOVY 225 MG/1.5ML SOAJ: 225 | 30 days supply | Qty: 2 | Fill #0

## 2020-05-31 NOTE — Patient Instructions (Signed)
Congratulations on your 9 lb weight loss

## 2020-05-31 NOTE — Progress Notes (Signed)
This visit occurred during the SARS-CoV-2 public health emergency.  Safety protocols were in place, including screening questions prior to the visit, additional usage of staff PPE, and extensive cleaning of exam room while observing appropriate contact time as indicated for disinfecting solutions.  Subjective:     Patient ID: Audrey [name] , female    DOB: 02-03-1986 , 34 y.o.   MRN: [id number]   Chief Complaint  Patient presents with  . Weight Check    HPI  Here for weight check, she is tolerating Rybelsus well. She had started walking however her work schedule has changed. Working 5 days a week. She has two children ages 35 and 51 y/o.  She has increased her water intake as well.   Wt Readings from Last 3 Encounters: 05/31/20 : 273 lb 6.4 oz (124 kg) 05/03/20 : 282 lb 6.4 oz (128.1 kg) 03/13/20 : 274 lb (124.3 kg)      Past Medical History:  Diagnosis Date  . Common migraine with intractable migraine 01/23/2017  . Diabetes mellitus without complication ([place])   . Fetal demise 05/19/2013  . GERD (gastroesophageal reflux disease)    with pregnancy  . Gestational diabetes   . Headache    migraines  . Hemorrhage after delivery of fetus 08/12/2006  . Morbidly obese ([place]) 01/23/2017  . Preeclampsia   . Pregnancy induced hypertension   . S/P cesarean section 05/20/2013  . S/P cesarean section 11/18/2014     Family History  Adopted: Yes  Problem Relation Age of Onset  . Diabetes Mother   . Asthma Other   . Hyperlipidemia Other   . Obesity Other      Current Outpatient Medications:  .  aspirin-acetaminophen-caffeine (EXCEDRIN MIGRAINE) 250-250-65 MG tablet, Take 2 tablets by mouth every 8 (eight) hours as needed for headache., Disp: , Rfl:  .  Erenumab-aooe (AIMOVIG) 140 MG/ML SOAJ, Inject 140 mg into the skin every 30 (thirty) days., Disp: 3 pen, Rfl: 1 .  ferrous sulfate 325 (65 FE) MG tablet, Take 1 tablet (325 mg total) by mouth daily., Disp: 30 tablet, Rfl: 3 .   ibuprofen (ADVIL) 200 MG tablet, Take 200 mg by mouth every 6 (six) hours as needed., Disp: , Rfl:  .  triamcinolone (NASACORT) 55 MCG/ACT AERO nasal inhaler, Place 1 spray into the nose 2 (two) times daily., Disp: 1 Inhaler, Rfl: 2 .  Ubrogepant (UBRELVY) 50 MG TABS, Take 1 tablet by mouth daily., Disp: 30 tablet, Rfl: 2 .  Fremanezumab-vfrm (AJOVY) 225 MG/1.5ML SOAJ, Inject 1 pen into the skin every 30 (thirty) days., Disp: 1 pen, Rfl: 3 .  naproxen (NAPROSYN) 500 MG tablet, Take 1 tablet (500 mg total) by mouth every 12 (twelve) hours as needed for mild pain or moderate pain. (Patient not taking: Reported on 05/03/2020), Disp: 30 tablet, Rfl: 0 .  Semaglutide (RYBELSUS) 14 MG TABS, Take 30 minutes before breakfast, Disp: 30 tablet, Rfl: 3 .  valACYclovir (VALTREX) 1000 MG tablet, Take 2 tablets (2,000 mg total) by mouth 2 (two) times daily. (Patient not taking: Reported on 05/03/2020), Disp: 10 tablet, Rfl: 1   Allergies  Allergen Reactions  . Imitrex [Sumatriptan]     Nausea     Review of Systems  Constitutional: Negative.   Respiratory: Negative.   Cardiovascular: Negative.  Negative for chest pain, palpitations and leg swelling.  Neurological: Negative.  Negative for dizziness and headaches.  Psychiatric/Behavioral: Negative.      Today's Vitals   05/31/20 0827  BP:  124/76  Pulse: 73  Temp: (!) 97.3 F (36.3 C)  TempSrc: Oral  Weight: 273 lb 6.4 oz (124 kg)  Height: 5' 8.6" (1.742 m)  PainSc: 5   PainLoc: Head   Body mass index is 40.85 kg/m.   Objective:  Physical Exam Constitutional:      General: She is not in acute distress.    Appearance: Normal appearance. She is obese.  Cardiovascular:     Rate and Rhythm: Normal rate and regular rhythm.     Pulses: Normal pulses.     Heart sounds: Normal heart sounds. No murmur.  Pulmonary:     Effort: Pulmonary effort is normal. No respiratory distress.     Breath sounds: Normal breath sounds.  Skin:    Capillary Refill:  Capillary refill takes less than 2 seconds.  Neurological:     General: No focal deficit present.     Mental Status: She is alert and oriented to person, place, and time.     Cranial Nerves: No cranial nerve deficit.  Psychiatric:        Mood and Affect: Mood normal.        Behavior: Behavior normal.        Thought Content: Thought content normal.        Judgment: Judgment normal.         Assessment And Plan:    1. Migraine without aura and without status migrainosus, not intractable  Headaches are persistent and having them almost daily will try to get Ajovy pending insurance approval, will have to do a Prior Authorization - Fremanezumab-vfrm (AJOVY) 225 MG/1.5ML SOAJ; Inject 1 pen into the skin every 30 (thirty) days.  Dispense: 1 pen; Refill: 3  2. Class 3 severe obesity with body mass index (BMI) of 40.0 to 44.9 in adult, unspecified obesity type, unspecified whether serious comorbidity present [facility])  She has lost 9 lbs since her last visit  Encouraged to increase physical activity and eat a healthy diet low in sugar and carbs  3. Insulin resistance  Tolerating Rybelsus well, will increase to 14 mg daily  Will check insulin levels today - Semaglutide (RYBELSUS) 14 MG TABS; Take 30 minutes before breakfast  Dispense: 30 tablet; Refill: 3 - Hemoglobin A1c - Insulin, random - BMP8+eGFR    Minette Brine, FNP    THE PATIENT IS ENCOURAGED TO PRACTICE SOCIAL DISTANCING DUE TO THE COVID-19 PANDEMIC.

## 2020-06-01 LAB — INSULIN, RANDOM: INSULIN: 33.2 u[IU]/mL — ABNORMAL HIGH (ref 2.6–24.9)

## 2020-06-01 LAB — BMP8+EGFR
BUN/Creatinine Ratio: 13 (ref 9–23)
BUN: 12 mg/dL (ref 6–20)
CO2: 22 mmol/L (ref 20–29)
Calcium: 9.1 mg/dL (ref 8.7–10.2)
Chloride: 103 mmol/L (ref 96–106)
Creatinine, Ser: 0.89 mg/dL (ref 0.57–1.00)
GFR calc Af Amer: 98 mL/min/{1.73_m2} (ref >59)
GFR calc non Af Amer: 85 mL/min/{1.73_m2} (ref >59)
Glucose: 75 mg/dL (ref 65–99)
Potassium: 4.2 mmol/L (ref 3.5–5.2)
Sodium: 139 mmol/L (ref 134–144)

## 2020-06-01 LAB — HEMOGLOBIN A1C
Est. average glucose Bld gHb Est-mCnc: 88 mg/dL
Hgb A1c MFr Bld: 4.7 % — ABNORMAL LOW (ref 4.8–5.6)

## 2020-07-04 ENCOUNTER — Encounter: Payer: Self-pay | Admitting: Nurse Practitioner

## 2020-07-08 ENCOUNTER — Encounter: Payer: Self-pay | Admitting: Orthopaedic Surgery

## 2020-07-13 ENCOUNTER — Telehealth: Payer: Self-pay

## 2020-07-13 ENCOUNTER — Encounter: Payer: Self-pay | Admitting: Orthopaedic Surgery

## 2020-07-13 ENCOUNTER — Ambulatory Visit (INDEPENDENT_AMBULATORY_CARE_PROVIDER_SITE_OTHER): Payer: No Typology Code available for payment source | Admitting: Orthopaedic Surgery

## 2020-07-13 ENCOUNTER — Other Ambulatory Visit: Payer: Self-pay

## 2020-07-13 DIAGNOSIS — M25561 Pain in right knee: Secondary | ICD-10-CM

## 2020-07-13 DIAGNOSIS — M1711 Unilateral primary osteoarthritis, right knee: Secondary | ICD-10-CM | POA: Insufficient documentation

## 2020-07-13 DIAGNOSIS — G8929 Other chronic pain: Secondary | ICD-10-CM | POA: Diagnosis not present

## 2020-07-13 DIAGNOSIS — M170 Bilateral primary osteoarthritis of knee: Secondary | ICD-10-CM | POA: Insufficient documentation

## 2020-07-13 MED FILL — AJOVY 225 MG/1.5ML SOAJ: 225 | 30 days supply | Qty: 2 | Fill #1

## 2020-07-13 NOTE — Progress Notes (Signed)
Office Visit Note   Patient: Audrey [name]           Date of Birth: Jul 12, 1986           MRN: [id number] Visit Date: 07/13/2020              Requested by: Arnette Felts, FNP [address] [place],  [place] [address] PCP: Arnette Felts, FNP   Assessment & Plan: Visit Diagnoses:  1. Morbidly obese (HCC)   2. Chronic pain of right knee   3. Unilateral primary osteoarthritis, right knee     Plan: [facility] tricompartmental osteoarthritis right knee.  Will inject with cortisone today and precertify viscosupplementation. This patient is diagnosed with osteoarthritis of the knee(s).    Radiographs show evidence of joint space narrowing, osteophytes, subchondral sclerosis and/or subchondral cysts.  This patient has knee pain which interferes with functional and activities of daily living.    This patient has experienced inadequate response, adverse effects and/or intolerance with conservative treatments such as acetaminophen, NSAIDS, topical creams, physical therapy or regular exercise, knee bracing and/or weight loss.   This patient has experienced inadequate response or has a contraindication to intra articular steroid injections for at least 3 months.   This patient is not scheduled to have a total knee replacement within 6 months of starting treatment with viscosupplementation.   Follow-Up Instructions: Return Precertify viscosupplementation.   Orders:  No orders of the defined types were placed in this encounter.  No orders of the defined types were placed in this encounter.     Procedures: No procedures performed   Clinical Data: No additional findings.   Subjective: Chief Complaint  Patient presents with   Right Knee - Pain  Recurrent symptoms of osteoarthritis right knee.  Prior films demonstrate tricompartmental degenerative changes with about 1 degree of varus.  Has responded to cortisone in the past.  Last injection was about 4 months  ago.  HPI  Review of Systems   Objective: Vital Signs: There were no vitals taken for this visit.  Physical Exam Constitutional:      Appearance: She is well-developed.  Eyes:     Pupils: Pupils are equal, round, and reactive to light.  Pulmonary:     Effort: Pulmonary effort is normal.  Skin:    General: Skin is warm and dry.  Neurological:     Mental Status: She is alert and oriented to person, place, and time.  Psychiatric:        Behavior: Behavior normal.     Ortho Exam right knee was not hot red warm or swollen.  No effusion.  Predominately medial joint pain.  Mild patellar crepitation but no pain with compression.  Specialty Comments:  No specialty comments available.  Imaging: No results found.   PMFS History: Patient Active Problem List   Diagnosis Date Noted   Unilateral primary osteoarthritis, right knee 07/13/2020   Dizziness 08/16/2019   Cephalalgia 08/16/2019   Class 3 severe obesity with body mass index (BMI) of 40.0 to 44.9 in adult [facility]) 01/27/2019   Chronic pain of right knee 01/27/2019   History of gestational diabetes 01/27/2019   Breast lump on left side at 3 o'clock position 01/27/2019   Cesarean delivery delivered 01/26/2019   Gastroesophageal reflux disease 01/26/2019   Morbidly obese (HCC) 01/23/2017   Common migraine with intractable migraine 01/23/2017   S/P cesarean section 11/18/2014   Fetal heart rate nonreactive    [redacted] weeks gestation of pregnancy  Non-reactive NST (non-stress test) 11/15/2014   Migraine 07/12/2014   Past Medical History:  Diagnosis Date   Common migraine with intractable migraine 01/23/2017   Diabetes mellitus without complication (HCC)    Fetal demise 05/19/2013   GERD (gastroesophageal reflux disease)    with pregnancy   Gestational diabetes    Headache    migraines   Hemorrhage after delivery of fetus 08/12/2006   Morbidly obese (HCC) 01/23/2017   Preeclampsia    Pregnancy  induced hypertension    S/P cesarean section 05/20/2013   S/P cesarean section 11/18/2014    Family History  Adopted: Yes  Problem Relation Age of Onset   Diabetes Mother    Asthma Other    Hyperlipidemia Other    Obesity Other     Past Surgical History:  Procedure Laterality Date   CESAREAN SECTION     CESAREAN SECTION N/A 05/19/2013   Procedure: CESAREAN SECTION;  Surgeon: Sherron Monday, MD;  Location: [facility];  Service: Obstetrics;  Laterality: N/A;   CESAREAN SECTION WITH BILATERAL TUBAL LIGATION Bilateral 11/17/2014   Procedure: CESAREAN SECTION WITH BILATERAL TUBAL LIGATION;  Surgeon: Sherian Rein, MD;  Location: [facility];  Service: Obstetrics;  Laterality: Bilateral;   TOENAIL EXCISION     WISDOM TOOTH EXTRACTION     Social History   Occupational History    Employer: [employer]  Tobacco Use   Smoking status: Never Smoker   Smokeless tobacco: Never Used  Vaping Use   Vaping Use: Never used  Substance and Sexual Activity   Alcohol use: No   Drug use: No   Sexual activity: Not Currently     Valeria Batman, MD   Note - This record has been created using [employer].  Chart creation errors have been sought, but may not always  have been located. Such creation errors do not reflect on  the standard of medical care.

## 2020-07-13 NOTE — Telephone Encounter (Signed)
Please get approval for right knee gel injection-Dr. Cleophas Dunker pt.

## 2020-07-16 NOTE — Telephone Encounter (Signed)
Noted  

## 2020-07-18 ENCOUNTER — Other Ambulatory Visit: Payer: Self-pay | Admitting: *Deleted

## 2020-07-18 ENCOUNTER — Ambulatory Visit: Payer: No Typology Code available for payment source | Attending: Internal Medicine

## 2020-07-18 DIAGNOSIS — Z20822 Contact with and (suspected) exposure to covid-19: Secondary | ICD-10-CM

## 2020-07-19 LAB — NOVEL CORONAVIRUS, NAA: SARS-CoV-2, NAA: NOT DETECTED

## 2020-07-19 LAB — SARS-COV-2, NAA 2 DAY TAT

## 2020-07-20 ENCOUNTER — Other Ambulatory Visit: Payer: Self-pay | Admitting: Nurse Practitioner

## 2020-07-20 ENCOUNTER — Encounter: Payer: Self-pay | Admitting: Nurse Practitioner

## 2020-07-20 ENCOUNTER — Telehealth: Payer: Self-pay

## 2020-07-20 DIAGNOSIS — G8929 Other chronic pain: Secondary | ICD-10-CM

## 2020-07-20 NOTE — Telephone Encounter (Signed)
Talked with Arline Asp at [facility] to get patient's benefits for Orthovisc-[id number], right knee and to initiate PA.  Per Arline Asp patient needs a referral from her current PCP to start PA for Orthovisc, right knee, previous referral has expired. Patient will be covered at 100% for Orthovisc, right knee.   Talked with patient and advised her of message below.  Patient stated that she would send a mychart message to her PCP.

## 2020-07-23 ENCOUNTER — Telehealth: Payer: Self-pay

## 2020-07-23 NOTE — Telephone Encounter (Signed)
I left pt v/m notifying her that her PA for ajovy has been approved from 07/20/20 through 01/19/21 and she can pick it up at the pharmacy. YL,RMA

## 2020-08-20 ENCOUNTER — Encounter: Payer: Self-pay | Admitting: Nurse Practitioner

## 2020-08-20 ENCOUNTER — Other Ambulatory Visit: Payer: Self-pay

## 2020-08-20 ENCOUNTER — Ambulatory Visit (INDEPENDENT_AMBULATORY_CARE_PROVIDER_SITE_OTHER): Payer: No Typology Code available for payment source | Admitting: Nurse Practitioner

## 2020-08-20 VITALS — BP 118/80 | HR 89 | Temp 98.1°F | Ht 68.6 in | Wt 268.8 lb

## 2020-08-20 DIAGNOSIS — Z0000 Encounter for general adult medical examination without abnormal findings: Secondary | ICD-10-CM | POA: Diagnosis not present

## 2020-08-20 DIAGNOSIS — D649 Anemia, unspecified: Secondary | ICD-10-CM | POA: Diagnosis not present

## 2020-08-20 DIAGNOSIS — R197 Diarrhea, unspecified: Secondary | ICD-10-CM | POA: Diagnosis not present

## 2020-08-20 DIAGNOSIS — E8881 Metabolic syndrome: Secondary | ICD-10-CM

## 2020-08-20 DIAGNOSIS — Z8632 Personal history of gestational diabetes: Secondary | ICD-10-CM

## 2020-08-20 DIAGNOSIS — Z6841 Body Mass Index (BMI) 40.0 and over, adult: Secondary | ICD-10-CM

## 2020-08-20 DIAGNOSIS — B351 Tinea unguium: Secondary | ICD-10-CM

## 2020-08-20 DIAGNOSIS — Z1159 Encounter for screening for other viral diseases: Secondary | ICD-10-CM | POA: Diagnosis not present

## 2020-08-20 DIAGNOSIS — R0789 Other chest pain: Secondary | ICD-10-CM

## 2020-08-20 NOTE — Patient Instructions (Addendum)
Health Maintenance, Female Adopting a healthy lifestyle and getting preventive care are important in promoting health and wellness. Ask your health care provider about:  The right schedule for you to have regular tests and exams.  Things you can do on your own to prevent diseases and keep yourself healthy. What should I know about diet, weight, and exercise? Eat a healthy diet   Eat a diet that includes plenty of vegetables, fruits, low-fat dairy products, and lean protein.  Do not eat a lot of foods that are high in solid fats, added sugars, or sodium. Maintain a healthy weight Body mass index (BMI) is used to identify weight problems. It estimates body fat based on height and weight. Your health care provider can help determine your BMI and help you achieve or maintain a healthy weight. Get regular exercise Get regular exercise. This is one of the most important things you can do for your health. Most adults should:  Exercise for at least 150 minutes each week. The exercise should increase your heart rate and make you sweat (moderate-intensity exercise).  Do strengthening exercises at least twice a week. This is in addition to the moderate-intensity exercise.  Spend less time sitting. Even light physical activity can be beneficial. Watch cholesterol and blood lipids Have your blood tested for lipids and cholesterol at 35 years of age, then have this test every 5 years. Have your cholesterol levels checked more often if:  Your lipid or cholesterol levels are high.  You are older than 35 years of age.  You are at high risk for heart disease. What should I know about cancer screening? Depending on your health history and family history, you may need to have cancer screening at various ages. This may include screening for:  Breast cancer.  Cervical cancer.  Colorectal cancer.  Skin cancer.  Lung cancer. What should I know about heart disease, diabetes, and high blood  pressure? Blood pressure and heart disease  High blood pressure causes heart disease and increases the risk of stroke. This is more likely to develop in people who have high blood pressure readings, are of African descent, or are overweight.  Have your blood pressure checked: ? Every 3-5 years if you are 18-39 years of age. ? Every year if you are 40 years old or older. Diabetes Have regular diabetes screenings. This checks your fasting blood sugar level. Have the screening done:  Once every three years after age 40 if you are at a normal weight and have a low risk for diabetes.  More often and at a younger age if you are overweight or have a high risk for diabetes. What should I know about preventing infection? Hepatitis B If you have a higher risk for hepatitis B, you should be screened for this virus. Talk with your health care provider to find out if you are at risk for hepatitis B infection. Hepatitis C Testing is recommended for:  Everyone born from 1945 through 1965.  Anyone with known risk factors for hepatitis C. Sexually transmitted infections (STIs)  Get screened for STIs, including gonorrhea and chlamydia, if: ? You are sexually active and are younger than 35 years of age. ? You are older than 35 years of age and your health care provider tells you that you are at risk for this type of infection. ? Your sexual activity has changed since you were last screened, and you are at increased risk for chlamydia or gonorrhea. Ask your health care provider if   you are at risk.  Ask your health care provider about whether you are at high risk for HIV. Your health care provider may recommend a prescription medicine to help prevent HIV infection. If you choose to take medicine to prevent HIV, you should first get tested for HIV. You should then be tested every 3 months for as long as you are taking the medicine. Pregnancy  If you are about to stop having your period (premenopausal) and  you may become pregnant, seek counseling before you get pregnant.  Take 400 to 800 micrograms (mcg) of folic acid every day if you become pregnant.  Ask for birth control (contraception) if you want to prevent pregnancy. Osteoporosis and menopause Osteoporosis is a disease in which the bones lose minerals and strength with aging. This can result in bone fractures. If you are 85 years old or older, or if you are at risk for osteoporosis and fractures, ask your health care provider if you should:  Be screened for bone loss.  Take a calcium or vitamin D supplement to lower your risk of fractures.  Be given hormone replacement therapy (HRT) to treat symptoms of menopause. Follow these instructions at home: Lifestyle  Do not use any products that contain nicotine or tobacco, such as cigarettes, e-cigarettes, and chewing tobacco. If you need help quitting, ask your health care provider.  Do not use street drugs.  Do not share needles.  Ask your health care provider for help if you need support or information about quitting drugs. Alcohol use  Do not drink alcohol if: ? Your health care provider tells you not to drink. ? You are pregnant, may be pregnant, or are planning to become pregnant.  If you drink alcohol: ? Limit how much you use to 0-1 drink a day. ? Limit intake if you are breastfeeding.  Be aware of how much alcohol is in your drink. In the [place] one drink equals one 12 oz bottle of beer (355 mL), one 5 oz glass of wine (148 mL), or one 1 oz glass of hard liquor (44 mL). General instructions  Schedule regular health, dental, and eye exams.  Stay current with your vaccines.  Tell your health care provider if: ? You often feel depressed. ? You have ever been abused or do not feel safe at home. Summary  Adopting a healthy lifestyle and getting preventive care are important in promoting health and wellness.  Follow your health care provider's instructions about healthy  diet, exercising, and getting tested or screened for diseases.  Follow your health care provider's instructions on monitoring your cholesterol and blood pressure. This information is not intended to replace advice given to you by your health care provider. Make sure you discuss any questions you have with your health care provider. Document Revised: 12/08/2018 Document Reviewed: 12/08/2018 Elsevier Patient Education  [employer].  I recommend you wait at least 4 weeks after having your 2nd covid vaccine to have the flu vaccine at the minimal you can do 2 weeks.

## 2020-08-20 NOTE — Progress Notes (Signed)
I,Yamilka Roman [employer] as a Education administrator for [employer], FNP.,have documented all relevant documentation on the behalf of Minette Brine, FNP,as directed by  Minette Brine, FNP while in the presence of Minette Brine, [place].  This visit occurred during the SARS-CoV-2 public health emergency.  Safety protocols were in place, including screening questions prior to the visit, additional usage of staff PPE, and extensive cleaning of exam room while observing appropriate contact time as indicated for disinfecting solutions.  Subjective:     Patient ID: Audrey [name] , female    DOB: 06-Jan-1986 , 34 y.o.   MRN: [id number]   Chief Complaint  Patient presents with  . Annual Exam    HPI  Here for HM   She has not taken Rybelsus in the last 3 weeks. She is taking probiotic gummies. She is taking 2 probiotics. She was given a sample of linzess when she was having constipation in May.  She feels every time she eats food she will have loose bowels.    Wt Readings from Last 3 Encounters: 08/20/20 : 268 lb 12.8 oz (121.9 kg) 05/31/20 : 273 lb 6.4 oz (124 kg) 05/03/20 : 282 lb 6.4 oz (128.1 kg)     Past Medical History:  Diagnosis Date  . Common migraine with intractable migraine 01/23/2017  . Diabetes mellitus without complication ([place])   . Fetal demise 05/19/2013  . GERD (gastroesophageal reflux disease)    with pregnancy  . Gestational diabetes   . Headache    migraines  . Hemorrhage after delivery of fetus 08/12/2006  . Morbidly obese ([place]) 01/23/2017  . Preeclampsia   . Pregnancy induced hypertension   . S/P cesarean section 05/20/2013  . S/P cesarean section 11/18/2014     Family History  Adopted: Yes  Problem Relation Age of Onset  . Diabetes Mother   . Asthma Other   . Hyperlipidemia Other   . Obesity Other      Current Outpatient Medications:  .  aspirin-acetaminophen-caffeine (EXCEDRIN MIGRAINE) 250-250-65 MG tablet, Take 2 tablets by mouth every 8 (eight) hours as needed  for headache., Disp: , Rfl:  .  Fremanezumab-vfrm (AJOVY) 225 MG/1.5ML SOAJ, Inject 1 pen into the skin every 30 (thirty) days., Disp: 1 pen, Rfl: 3 .  ibuprofen (ADVIL) 200 MG tablet, Take 200 mg by mouth every 6 (six) hours as needed., Disp: , Rfl:  .  Semaglutide (RYBELSUS) 14 MG TABS, Take 30 minutes before breakfast, Disp: 30 tablet, Rfl: 3 .  triamcinolone (NASACORT) 55 MCG/ACT AERO nasal inhaler, Place 1 spray into the nose 2 (two) times daily., Disp: 1 Inhaler, Rfl: 2 .  Ubrogepant (UBRELVY) 50 MG TABS, Take 1 tablet by mouth daily., Disp: 30 tablet, Rfl: 2   Allergies  Allergen Reactions  . Imitrex [Sumatriptan]     Nausea      The patient states she uses tubal ligation for birth control.  Patient's last menstrual period was 08/20/2020.. Negative for Dysmenorrhea and Negative for Menorrhagia. Negative for: breast discharge, breast lump(s), breast pain and breast self exam. Associated symptoms include abnormal vaginal bleeding. Pertinent negatives include abnormal bleeding (hematology), anxiety, decreased libido, depression, difficulty falling sleep, dyspareunia, history of infertility, nocturia, sexual dysfunction, sleep disturbances, urinary incontinence, urinary urgency, vaginal discharge and vaginal itching. Diet regular. The patient states her exercise level is minimal - was walking but in the last 2 weeks. She has started the [facility] CMA program which is for one year.  Her kids are now back in school.  The patient's tobacco use is:  Social History   Tobacco Use  Smoking Status Never Smoker  Smokeless Tobacco Never Used   She has been exposed to passive smoke. The patient's alcohol use is:  Social History   Substance and Sexual Activity  Alcohol Use No   Additional information:she will be seen at [facility] in September.    Review of Systems  Constitutional: Negative.   HENT: Negative.   Eyes: Negative.   Respiratory: Negative.   Cardiovascular: Negative.   Negative for chest pain, palpitations and leg swelling.  Gastrointestinal:       Loose bowels, will have to have a BM after eating any type of foods  Endocrine: Negative.  Negative for polydipsia, polyphagia and polyuria.  Genitourinary: Negative.   Musculoskeletal: Negative.   Skin: Negative.   Allergic/Immunologic: Negative.   Neurological: Negative.   Hematological: Negative.   Psychiatric/Behavioral: Negative.      Today's Vitals   08/20/20 1410  BP: 118/80  Pulse: 89  Temp: 98.1 F (36.7 C)  TempSrc: Oral  Weight: 268 lb 12.8 oz (121.9 kg)  Height: 5' 8.6" (1.742 m)  PainSc: 0-No pain   Body mass index is 40.16 kg/m.   Objective:  Physical Exam Constitutional:      General: She is not in acute distress.    Appearance: Normal appearance. She is well-developed. She is obese.  HENT:     Head: Normocephalic and atraumatic.     Right Ear: Hearing normal.     Left Ear: Hearing normal.     Nose:     Comments: Deferred - masked    Mouth/Throat:     Comments: Deferred - masked Eyes:     General: Lids are normal.     Extraocular Movements: Extraocular movements intact.     Conjunctiva/sclera: Conjunctivae normal.     Pupils: Pupils are equal, round, and reactive to light.     Funduscopic exam:    Right eye: No papilledema.        Left eye: No papilledema.  Neck:     Thyroid: No thyroid mass.     Vascular: No carotid bruit.  Cardiovascular:     Rate and Rhythm: Normal rate and regular rhythm.     Pulses: Normal pulses.     Heart sounds: Normal heart sounds. No murmur heard.   Pulmonary:     Effort: Pulmonary effort is normal. No respiratory distress.     Breath sounds: Normal breath sounds. No wheezing.  Abdominal:     General: Abdomen is flat. Bowel sounds are normal. There is no distension.     Palpations: Abdomen is soft.     Tenderness: There is no abdominal tenderness.     Comments: Mildly tender to right lower abdomen, she is on her menstrual cycle today   Genitourinary:    Rectum: Guaiac result negative.  Musculoskeletal:        General: No swelling. Normal range of motion.     Cervical back: Full passive range of motion without pain, normal range of motion and neck supple.     Right lower leg: No edema.     Left lower leg: No edema.  Skin:    General: Skin is warm and dry.     Capillary Refill: Capillary refill takes less than 2 seconds.  Neurological:     General: No focal deficit present.     Mental Status: She is alert and oriented to person, place, and time.  Cranial Nerves: No cranial nerve deficit.     Sensory: No sensory deficit.  Psychiatric:        Mood and Affect: Mood normal.        Behavior: Behavior normal.        Thought Content: Thought content normal.        Judgment: Judgment normal.         Assessment And Plan:     1. Encounter for general adult medical examination w/o abnormal findings . Behavior modifications discussed and diet history reviewed.   . Pt will continue to exercise regularly and modify diet with low GI, plant based foods and decrease intake of processed foods.  . Recommend intake of daily multivitamin, Vitamin D, and calcium.  . Recommend for preventive screenings, as well as recommend immunizations that include influenza (she will get done at work), TDAP (she is to get from [facility])  2. Encounter for hepatitis C screening test for low risk patient  Will check Hepatitis C screening due to recent recommendations to screen all adults 18 years and older - Hepatitis C antibody  3. Class 3 severe obesity with body mass index (BMI) of 40.0 to 44.9 in adult, unspecified obesity type, unspecified whether serious comorbidity present (HCC)  Chronic  Discussed healthy diet and regular exercise options   Encouraged to exercise at least 150 minutes per week with 2 days of strength training  4. Insulin resistance  She is to continue with the rybelsus - CMP14+EGFR - Lipid panel -  Insulin, random  5. Anemia, unspecified type  Will check CBC pending results will add iron studies - CBC  6. History of gestational diabetes - Ambulatory referral to Ophthalmology  7. Diarrhea, unspecified type  She feels this has been ongoing for several months  I will refer to GI for further evaluation, I did discuss with her at times rybelsus can cause GI upset but she does not feel this is related due to the timing - Ambulatory referral to Gastroenterology  8. Onychomycosis  Multiple nails are thickened, will refer to podiatry  - Ambulatory referral to Podiatry  9. Atypical chest pain  We have done an EKG previously which was essentially normal  She is adopted so it is difficult to know her full family history  Will refer to Cardiology for further evaluation  - Ambulatory referral to Cardiology     Patient was given opportunity to ask questions. Patient verbalized understanding of the plan and was able to repeat key elements of the plan. All questions were answered to their satisfaction.   Minette Brine, FNP   I, Minette Brine, FNP, have reviewed all documentation for this visit. The documentation on 08/26/20 for the exam, diagnosis, procedures, and orders are all accurate and complete.  THE PATIENT IS ENCOURAGED TO PRACTICE SOCIAL DISTANCING DUE TO THE COVID-19 PANDEMIC.

## 2020-08-21 LAB — CMP14+EGFR
ALT: 17 IU/L (ref 0–32)
AST: 20 IU/L (ref 0–40)
Albumin/Globulin Ratio: 1.4 (ref 1.2–2.2)
Albumin: 4.2 g/dL (ref 3.8–4.8)
Alkaline Phosphatase: 77 IU/L (ref 48–121)
BUN/Creatinine Ratio: 11 (ref 9–23)
BUN: 9 mg/dL (ref 6–20)
Bilirubin Total: 0.5 mg/dL (ref 0.0–1.2)
CO2: 24 mmol/L (ref 20–29)
Calcium: 8.7 mg/dL (ref 8.7–10.2)
Chloride: 104 mmol/L (ref 96–106)
Creatinine, Ser: 0.81 mg/dL (ref 0.57–1.00)
GFR calc Af Amer: 109 mL/min/{1.73_m2} (ref >59)
GFR calc non Af Amer: 94 mL/min/{1.73_m2} (ref >59)
Globulin, Total: 2.9 g/dL (ref 1.5–4.5)
Glucose: 73 mg/dL (ref 65–99)
Potassium: 4.1 mmol/L (ref 3.5–5.2)
Sodium: 141 mmol/L (ref 134–144)
Total Protein: 7.1 g/dL (ref 6.0–8.5)

## 2020-08-21 LAB — HEPATITIS C ANTIBODY: Hep C Virus Ab: <0.1 s/co ratio (ref 0.0–0.9)

## 2020-08-21 LAB — CBC
Hematocrit: 33.9 % — ABNORMAL LOW (ref 34.0–46.6)
Hemoglobin: 10.2 g/dL — ABNORMAL LOW (ref 11.1–15.9)
MCH: 24.1 pg — ABNORMAL LOW (ref 26.6–33.0)
MCHC: 30.1 g/dL — ABNORMAL LOW (ref 31.5–35.7)
MCV: 80 fL (ref 79–97)
Platelets: 402 10*3/uL (ref 150–450)
RBC: 4.23 x10E6/uL (ref 3.77–5.28)
RDW: 15.8 % — ABNORMAL HIGH (ref 11.7–15.4)
WBC: 10.7 10*3/uL (ref 3.4–10.8)

## 2020-08-21 LAB — LIPID PANEL
Chol/HDL Ratio: 3.7 ratio (ref 0.0–4.4)
Cholesterol, Total: 153 mg/dL (ref 100–199)
HDL: 41 mg/dL (ref >39)
LDL Chol Calc (NIH): 97 mg/dL (ref 0–99)
Triglycerides: 76 mg/dL (ref 0–149)
VLDL Cholesterol Cal: 15 mg/dL (ref 5–40)

## 2020-08-21 LAB — INSULIN, RANDOM: INSULIN: 39.2 u[IU]/mL — ABNORMAL HIGH (ref 2.6–24.9)

## 2020-08-22 ENCOUNTER — Encounter: Payer: Self-pay | Admitting: Nurse Practitioner

## 2020-08-27 ENCOUNTER — Encounter: Payer: Self-pay | Admitting: Nurse Practitioner

## 2020-09-10 ENCOUNTER — Emergency Department (HOSPITAL_BASED_OUTPATIENT_CLINIC_OR_DEPARTMENT_OTHER): Payer: No Typology Code available for payment source

## 2020-09-10 ENCOUNTER — Emergency Department (HOSPITAL_BASED_OUTPATIENT_CLINIC_OR_DEPARTMENT_OTHER)
Admission: EM | Admit: 2020-09-10 | Discharge: 2020-09-10 | Disposition: A | Discharge status: Home / Self Care | Payer: No Typology Code available for payment source | Attending: Emergency Medicine | Admitting: Emergency Medicine

## 2020-09-10 ENCOUNTER — Other Ambulatory Visit: Payer: Self-pay

## 2020-09-10 ENCOUNTER — Encounter (HOSPITAL_BASED_OUTPATIENT_CLINIC_OR_DEPARTMENT_OTHER): Payer: Self-pay

## 2020-09-10 DIAGNOSIS — K219 Gastro-esophageal reflux disease without esophagitis: Secondary | ICD-10-CM | POA: Diagnosis not present

## 2020-09-10 DIAGNOSIS — E119 Type 2 diabetes mellitus without complications: Secondary | ICD-10-CM | POA: Insufficient documentation

## 2020-09-10 DIAGNOSIS — Z7982 Long term (current) use of aspirin: Secondary | ICD-10-CM | POA: Insufficient documentation

## 2020-09-10 DIAGNOSIS — R0789 Other chest pain: Secondary | ICD-10-CM | POA: Insufficient documentation

## 2020-09-10 LAB — BASIC METABOLIC PANEL
Anion gap: 11 (ref 5–15)
BUN: 12 mg/dL (ref 6–20)
CO2: 25 mmol/L (ref 22–32)
Calcium: 9.1 mg/dL (ref 8.9–10.3)
Chloride: 102 mmol/L (ref 98–111)
Creatinine, Ser: 0.7 mg/dL (ref 0.44–1.00)
GFR calc Af Amer: >60 mL/min (ref >60)
GFR calc non Af Amer: >60 mL/min (ref >60)
Glucose, Bld: 92 mg/dL (ref 70–99)
Potassium: 3.6 mmol/L (ref 3.5–5.1)
Sodium: 138 mmol/L (ref 135–145)

## 2020-09-10 LAB — PREGNANCY, URINE: Preg Test, Ur: NEGATIVE

## 2020-09-10 LAB — CBC
HCT: 38.7 % (ref 36.0–46.0)
Hemoglobin: 11.3 g/dL — ABNORMAL LOW (ref 12.0–15.0)
MCH: 24.2 pg — ABNORMAL LOW (ref 26.0–34.0)
MCHC: 29.2 g/dL — ABNORMAL LOW (ref 30.0–36.0)
MCV: 83 fL (ref 80.0–100.0)
Platelets: 388 10*3/uL (ref 150–400)
RBC: 4.66 MIL/uL (ref 3.87–5.11)
RDW: 16.3 % — ABNORMAL HIGH (ref 11.5–15.5)
WBC: 9.8 10*3/uL (ref 4.0–10.5)
nRBC: 0 % (ref 0.0–0.2)

## 2020-09-10 LAB — TROPONIN I (HIGH SENSITIVITY)
Troponin I (High Sensitivity): 2 ng/L (ref <18)
Troponin I (High Sensitivity): <2 ng/L (ref <18)

## 2020-09-10 MED ORDER — PANTOPRAZOLE SODIUM 20 MG PO TBEC: 20.0000 mg | DELAYED_RELEASE_TABLET | Freq: Every day | ORAL | 0 refills | Status: DC

## 2020-09-10 MED FILL — PANTOPRAZOLE SOD DR 20 MG T: 20 | 30 days supply | Qty: 30 | Fill #0

## 2020-09-10 NOTE — ED Triage Notes (Signed)
Pt states that she has been having intermittent CP for a few months reports pain is central to left chest with radiation into back. Denies any other symptoms.

## 2020-09-10 NOTE — ED Provider Notes (Signed)
[facility] EMERGENCY DEPARTMENT Provider Note  CSN: [id number] Arrival date & time: 09/10/20 1012    History Chief Complaint  Patient presents with  . Chest Pain    HPI  Audrey [name] is a 34 y.o. female with history of multiple medical problems, but no known CAD has had intermittent episodes of chest pain off and on for at least 18 months. She states pain is a sharp, left parasternal pain, radiates up to her L shoulder and into her back. Not associated with SOB, cough or fever. She does have some belching with it. She reports the pain typically comes for a few days and then resolves for a few weeks. She reports pain returned last night. Not worse with eating or deep breath. No fevers. No abd pain, N/V/D   Past Medical History:  Diagnosis Date  . Common migraine with intractable migraine 01/23/2017  . Diabetes mellitus without complication (HCC)   . Fetal demise 05/19/2013  . GERD (gastroesophageal reflux disease)    with pregnancy  . Gestational diabetes   . Headache    migraines  . Hemorrhage after delivery of fetus 08/12/2006  . Morbidly obese (HCC) 01/23/2017  . Preeclampsia   . Pregnancy induced hypertension   . S/P cesarean section 05/20/2013  . S/P cesarean section 11/18/2014    Past Surgical History:  Procedure Laterality Date  . CESAREAN SECTION    . CESAREAN SECTION N/A 05/19/2013   Procedure: CESAREAN SECTION;  Surgeon: Sherron Monday, MD;  Location: [facility];  Service: Obstetrics;  Laterality: N/A;  . CESAREAN SECTION WITH BILATERAL TUBAL LIGATION Bilateral 11/17/2014   Procedure: CESAREAN SECTION WITH BILATERAL TUBAL LIGATION;  Surgeon: Sherian Rein, MD;  Location: [facility];  Service: Obstetrics;  Laterality: Bilateral;  . TOENAIL EXCISION    . WISDOM TOOTH EXTRACTION      Family History  Adopted: Yes  Problem Relation Age of Onset  . Diabetes Mother   . Asthma Other   . Hyperlipidemia Other   . Obesity Other     Social History    Tobacco Use  . Smoking status: Never Smoker  . Smokeless tobacco: Never Used  Vaping Use  . Vaping Use: Never used  Substance Use Topics  . Alcohol use: No  . Drug use: No     Home Medications Prior to Admission medications   Medication Sig Start Date End Date Taking? Authorizing Provider  aspirin-acetaminophen-caffeine (EXCEDRIN MIGRAINE) [phone] MG tablet Take 2 tablets by mouth every 8 (eight) hours as needed for headache.    [provider]  Fremanezumab-vfrm (AJOVY) 225 MG/1.5ML SOAJ Inject 1 pen into the skin every 30 (thirty) days. 05/31/20   Arnette Felts, FNP  ibuprofen (ADVIL) 200 MG tablet Take 200 mg by mouth every 6 (six) hours as needed.    [provider]  pantoprazole (PROTONIX) 20 MG tablet Take 1 tablet (20 mg total) by mouth daily. 09/10/20   Pollyann Savoy, MD  Semaglutide (RYBELSUS) 14 MG TABS Take 30 minutes before breakfast 05/31/20   Arnette Felts, FNP  triamcinolone (NASACORT) 55 MCG/ACT AERO nasal inhaler Place 1 spray into the nose 2 (two) times daily. 01/03/20 01/02/21  Arnette Felts, FNP  Ubrogepant (UBRELVY) 50 MG TABS Take 1 tablet by mouth daily. 11/03/19   Arnette Felts, FNP     Allergies    Imitrex [sumatriptan]   Review of Systems   Review of Systems A comprehensive review of systems was completed and negative except as noted in  HPI.    Physical Exam BP 139/90 (BP Location: Left Arm)   Pulse 88   Temp 97.9 F (36.6 C) (Oral)   Resp 18   Ht 5\' 7"  (1.702 m)   Wt 124.7 kg   LMP 08/20/2020   SpO2 100%   BMI 43.07 kg/m   Physical Exam Vitals and nursing note reviewed.  Constitutional:      Appearance: Normal appearance.  HENT:     Head: Normocephalic and atraumatic.     Nose: Nose normal.     Mouth/Throat:     Mouth: Mucous membranes are moist.  Eyes:     Extraocular Movements: Extraocular movements intact.     Conjunctiva/sclera: Conjunctivae normal.  Cardiovascular:     Rate and Rhythm: Normal rate.   Pulmonary:     Effort: Pulmonary effort is normal.     Breath sounds: Normal breath sounds.  Chest:     Chest wall: Tenderness (tender over the L parasternal area) present.  Abdominal:     General: Abdomen is flat.     Palpations: Abdomen is soft.     Tenderness: There is no abdominal tenderness.  Musculoskeletal:        General: No swelling. Normal range of motion.     Cervical back: Neck supple.  Skin:    General: Skin is warm and dry.  Neurological:     General: No focal deficit present.     Mental Status: She is alert.  Psychiatric:        Mood and Affect: Mood normal.      ED Results / Procedures / Treatments   Labs (all labs ordered are listed, but only abnormal results are displayed) Labs Reviewed  CBC - Abnormal; Notable for the following components:      Result Value   Hemoglobin 11.3 (*)    MCH 24.2 (*)    MCHC 29.2 (*)    RDW 16.3 (*)    All other components within normal limits  BASIC METABOLIC PANEL  PREGNANCY, URINE  TROPONIN I (HIGH SENSITIVITY)  TROPONIN I (HIGH SENSITIVITY)    EKG EKG Interpretation  Date/Time:  Monday September 10 2020 10:17:58 EDT Ventricular Rate:  78 PR Interval:  154 QRS Duration: 84 QT Interval:  364 QTC Calculation: 414 R Axis:   -11 Text Interpretation: Normal sinus rhythm Cannot rule out Anterior infarct , age undetermined Abnormal ECG No significant change since last tracing Confirmed by [id number] [phone]) on 09/10/2020 10:31:21 AM    Radiology DG Chest 2 View  Result Date: 09/10/2020 CLINICAL DATA:  Chest pain EXAM: CHEST - 2 VIEW COMPARISON:  12/11/2019 FINDINGS: Midline trachea. Normal heart size and mediastinal contours. No pleural effusion or pneumothorax. Clear lungs. IMPRESSION: No acute cardiopulmonary disease. Electronically Signed   By: 12/13/2019 M.D.   On: 09/10/2020 10:48    Procedures Procedures  Medications Ordered in the ED Medications - No data to display   MDM  Rules/Calculators/A&P MDM Patient with reproducible chest pain, also with some features consistent with GERD. Low likelihood of CAD/ACS. Initial EKG, CXR and Trop are neg. Will complete a delta Trop while in the ED. She is scheduled to see Cardiology in October, will give a trial of PPI in the meantime to see if that helps control her symptoms as well.  ED Course  I have reviewed the triage vital signs and the nursing notes.  Pertinent labs & imaging results that were available during my care of the patient were reviewed  by me and considered in my medical decision making (see chart for details).  Clinical Course as of Sep 10 1345  Mon Sep 10, 2020  1345 Second Trop remains negative. Plan discharge with PPI, PCP followup and Cards as scheduled. RTED for any other concerns.    [CS]    Clinical Course User Index [CS] Pollyann Savoy, MD    Final Clinical Impression(s) / ED Diagnoses Final diagnoses:  Chest wall pain  Gastroesophageal reflux disease, unspecified whether esophagitis present    Rx / DC Orders ED Discharge Orders         Ordered    pantoprazole (PROTONIX) 20 MG tablet  Daily        09/10/20 1346           Pollyann Savoy, MD 09/10/20 1346

## 2020-09-11 ENCOUNTER — Encounter: Payer: Self-pay | Admitting: Podiatry

## 2020-09-12 ENCOUNTER — Ambulatory Visit: Payer: No Typology Code available for payment source | Admitting: Podiatry

## 2020-09-25 ENCOUNTER — Encounter: Payer: Self-pay | Admitting: Nurse Practitioner

## 2020-09-25 MED FILL — PANTOPRAZOLE SOD DR 20 MG T: 20 | 30 days supply | Qty: 30 | Fill #0

## 2020-09-25 MED FILL — AJOVY 225 MG/1.5ML SOAJ: 225 | 30 days supply | Qty: 2 | Fill #2

## 2020-10-03 ENCOUNTER — Ambulatory Visit: Payer: No Typology Code available for payment source | Admitting: Nurse Practitioner

## 2020-10-03 ENCOUNTER — Ambulatory Visit (INDEPENDENT_AMBULATORY_CARE_PROVIDER_SITE_OTHER): Payer: No Typology Code available for payment source | Admitting: Cardiovascular Disease

## 2020-10-03 ENCOUNTER — Encounter: Payer: Self-pay | Admitting: Cardiovascular Disease

## 2020-10-03 ENCOUNTER — Other Ambulatory Visit: Payer: Self-pay | Admitting: Nurse Practitioner

## 2020-10-03 ENCOUNTER — Ambulatory Visit (INDEPENDENT_AMBULATORY_CARE_PROVIDER_SITE_OTHER): Payer: No Typology Code available for payment source | Admitting: Nurse Practitioner

## 2020-10-03 ENCOUNTER — Other Ambulatory Visit: Payer: Self-pay

## 2020-10-03 ENCOUNTER — Encounter: Payer: Self-pay | Admitting: Nurse Practitioner

## 2020-10-03 VITALS — BP 102/74 | HR 74 | Ht 68.0 in | Wt 270.0 lb

## 2020-10-03 VITALS — BP 126/80 | HR 78 | Temp 97.3°F | Ht 67.4 in | Wt 269.4 lb

## 2020-10-03 DIAGNOSIS — Z6841 Body Mass Index (BMI) 40.0 and over, adult: Secondary | ICD-10-CM | POA: Diagnosis not present

## 2020-10-03 DIAGNOSIS — R0602 Shortness of breath: Secondary | ICD-10-CM | POA: Diagnosis not present

## 2020-10-03 DIAGNOSIS — R0789 Other chest pain: Secondary | ICD-10-CM

## 2020-10-03 DIAGNOSIS — R079 Chest pain, unspecified: Secondary | ICD-10-CM | POA: Diagnosis not present

## 2020-10-03 DIAGNOSIS — B002 Herpesviral gingivostomatitis and pharyngotonsillitis: Secondary | ICD-10-CM | POA: Diagnosis not present

## 2020-10-03 DIAGNOSIS — E8881 Metabolic syndrome: Secondary | ICD-10-CM | POA: Diagnosis not present

## 2020-10-03 DIAGNOSIS — Z01812 Encounter for preprocedural laboratory examination: Secondary | ICD-10-CM

## 2020-10-03 DIAGNOSIS — K219 Gastro-esophageal reflux disease without esophagitis: Secondary | ICD-10-CM

## 2020-10-03 HISTORY — DX: Other chest pain: R07.89

## 2020-10-03 MED ORDER — VALACYCLOVIR HCL 1 G PO TABS: ORAL_TABLET | ORAL | 1 refills | Status: DC

## 2020-10-03 MED ORDER — RYBELSUS 7 MG PO TABS: 1.0000 | ORAL_TABLET | Freq: Every day | ORAL | 0 refills | Status: DC

## 2020-10-03 MED ORDER — METOPROLOL TARTRATE 100 MG PO TABS: ORAL_TABLET | ORAL | 0 refills | Status: DC

## 2020-10-03 MED FILL — RYBELSUS 7 MG TABS: 7 | 30 days supply | Qty: 30 | Fill #0

## 2020-10-03 MED FILL — valACYclovir HCL 1 GM TABS: 1 | 90 days supply | Qty: 90 | Fill #0

## 2020-10-03 MED FILL — METOPROLOL TARTRATE 100 MG: 100 | 1 days supply | Qty: 1 | Fill #0

## 2020-10-03 NOTE — Progress Notes (Signed)
Cardiology Office Note   Date:  10/03/2020   ID:  AERICA [name], DOB [birth year], MRN [id number]  PCP:  Audrey Felts, Audrey [name]  Cardiologist:   Audrey Si, Audrey [name]   No chief complaint on file.    History of Present Illness: Audrey [name] is a 34 y.o. female with insulin resistance and obesity who is being seen today for the evaluation of chest pain at the request of Audrey Felts, Audrey [name].  She is reported many months of substernal chest pain.  Sometimes radiates to the left of her chest or to her back.  The pain is very sharp and 9 out of 10 in severity.  When it occurs it sometimes last for a few days at a time.  It is waking her up from sleep at times.  It does not change with exertion.  She walks for exercise a few times per week.  When she does that she has some shortness of breath but no chest pain.  She denies LE edema, orthopnea or PND.   She has muscle camps in her legs when sleeping.    She has been seen in the ED several times for this.  She was thought to have GERD and given a prescription for pantoprazole.  She filled it for the first time last week.  She takes Tums but they don't seem to help.  She notes frequent belching.  She had GERD during pregnancy but this feels different.  In the ED cardiac enzymes and EKG have been unremarkable.  LFTs and bilirubin were also unremarkable.  She does wonder if this could be related to her gallbladder.  Symptoms are not typically after eating.  She has not noticed the more after eating spicy foods, though she does not eat very many spicy foods.  She rarely has foods with red sauce.  Past Medical History:  Diagnosis Date   Atypical chest pain 10/03/2020   Common migraine with intractable migraine 01/23/2017   Diabetes mellitus without complication (HCC)    Fetal demise 05/19/2013   GERD (gastroesophageal reflux disease)    with pregnancy   Gestational diabetes    Headache    migraines   Hemorrhage after delivery of fetus 08/12/2006     Morbidly obese (HCC) 01/23/2017   Preeclampsia    Pregnancy induced hypertension    S/P cesarean section 05/20/2013   S/P cesarean section 11/18/2014    Past Surgical History:  Procedure Laterality Date   CESAREAN SECTION     CESAREAN SECTION N/A 05/19/2013   Procedure: CESAREAN SECTION;  Surgeon: Audrey Monday, Audrey [name];  Location: [facility];  Service: Obstetrics;  Laterality: N/A;   CESAREAN SECTION WITH BILATERAL TUBAL LIGATION Bilateral 11/17/2014   Procedure: CESAREAN SECTION WITH BILATERAL TUBAL LIGATION;  Surgeon: Audrey Rein, Audrey [name];  Location: [facility];  Service: Obstetrics;  Laterality: Bilateral;   TOENAIL EXCISION     WISDOM TOOTH EXTRACTION       Current Outpatient Medications  Medication Sig Dispense Refill   Fremanezumab-vfrm (AJOVY) 225 MG/1.5ML SOAJ Inject 1 pen into the skin every 30 (thirty) days. 1 pen 3   ibuprofen (ADVIL) 200 MG tablet Take 200 mg by mouth every 6 (six) hours as needed.     pantoprazole (PROTONIX) 20 MG tablet Take 1 tablet (20 mg total) by mouth daily. 30 tablet 0   triamcinolone (NASACORT) 55 MCG/ACT AERO nasal inhaler Place 1 spray into the nose 2 (two) times daily. 1 Inhaler 2   aspirin-acetaminophen-caffeine (EXCEDRIN MIGRAINE) [phone]  MG tablet Take 2 tablets by mouth every 8 (eight) hours as needed for headache. (Patient not taking: Reported on 10/03/2020)     metoprolol tartrate (LOPRESSOR) 100 MG tablet TAKE 1 TABLET 2 HOURS PRIOR TO CT (Patient not taking: Reported on 10/03/2020) 1 tablet 0   Semaglutide (RYBELSUS) 7 MG TABS Take 1 tablet by mouth daily. Take 30 minutes before breakfast 30 tablet 0   valACYclovir (VALTREX) 1000 MG tablet Take at onset of symptoms of cold sore 1 tablet by mouth daily x 3 days with exacerbations 90 tablet 1   No current facility-administered medications for this visit.    Allergies:   Imitrex [sumatriptan]    Social History:  The patient  reports that she has never smoked. She has never  used smokeless tobacco. She reports that she does not drink alcohol and does not use drugs.   Family History:  The patient's family history includes Asthma in an other family member; Diabetes in her maternal grandmother, mother, and paternal grandmother; Hyperlipidemia in an other family member; Hypertension in her maternal grandmother and paternal grandmother; Obesity in an other family member. She was adopted.    ROS:  Please see the history of present illness.   Otherwise, review of systems are positive for none.   All other systems are reviewed and negative.    PHYSICAL EXAM: VS:  BP 102/74    Pulse 74    Ht 5\' 8"  (1.727 m)    Wt 270 lb (122.5 kg)    SpO2 99%    BMI 41.05 kg/m  , BMI Body mass index is 41.05 kg/m. GENERAL:  Well appearing HEENT:  Pupils equal round and reactive, fundi not visualized, oral mucosa unremarkable NECK:  No jugular venous distention, waveform within normal limits, carotid upstroke brisk and symmetric, no bruits, no thyromegaly LYMPHATICS:  No cervical adenopathy LUNGS:  Clear to auscultation bilaterally HEART:  RRR.  PMI not displaced or sustained,S1 and S2 within normal limits, no S3, no S4, no clicks, no rubs, no murmurs ABD:  Flat, positive bowel sounds normal in frequency in pitch, no bruits, no rebound, no guarding, no midline pulsatile mass, no hepatomegaly, no splenomegaly EXT:  2 plus pulses throughout, no edema, no cyanosis no clubbing SKIN:  No rashes no nodules NEURO:  Cranial nerves II through XII grossly intact, motor grossly intact throughout PSYCH:  Cognitively intact, oriented to person place and time   EKG:  EKG is not ordered today. The ekg ordered 09/11/2020 demonstrates Sinus rhythm.  Rate 78 bpm.   Recent Labs: 08/20/2020: ALT 17 09/10/2020: BUN 12; Creatinine, Ser 0.70; Hemoglobin 11.3; Platelets 388; Potassium 3.6; Sodium 138    Lipid Panel    Component Value Date/Time   CHOL 153 08/20/2020 1508   TRIG 76 08/20/2020 1508   HDL  41 08/20/2020 1508   CHOLHDL 3.7 08/20/2020 1508   CHOLHDL 4.0 08/16/2019 1524   LDLCALC 97 08/20/2020 1508   LDLCALC 108 (H) 08/16/2019 1524      Wt Readings from Last 3 Encounters:  10/03/20 269 lb 6.4 oz (122.2 kg)  10/03/20 270 lb (122.5 kg)  09/10/20 275 lb (124.7 kg)      ASSESSMENT AND PLAN:  # Atypical chest pain:  Symptoms seem most consistent with GERD.  They last for several days at a time, are worse when laying down, and are not worse with exertion.  We will get a coronary CT-a to ensure that she does not have any evidence of obstructive  coronary disease.  She has follow-up with gastroenterology next week.  I did recommend that she start taking the pantoprazole every day to see if this helps.   Current medicines are reviewed at length with the patient today.  The patient does not have concerns regarding medicines.  The following changes have been made:  no change  Labs/ tests ordered today include:   Orders Placed This Encounter  Procedures   CT CORONARY MORPH W/CTA COR W/SCORE W/CA W/CM &/OR WO/CM   CT CORONARY FRACTIONAL FLOW RESERVE DATA PREP   CT CORONARY FRACTIONAL FLOW RESERVE FLUID ANALYSIS   Basic metabolic panel     Disposition:   FU with Nohemi Nicklaus C. Duke Salvia, Audrey [name], [facility] as needed     Signed, Jahzier Villalon C. Duke Salvia, Audrey [name], [facility]  10/03/2020 12:42 PM    [facility]

## 2020-10-03 NOTE — Progress Notes (Signed)
I,Yamilka Roman [employer] as a Neurosurgeon for [employer], FNP.,have documented all relevant documentation on the behalf of Arnette Felts, FNP,as directed by  Arnette Felts, FNP while in the presence of Arnette Felts, FNP.  This visit occurred during the SARS-CoV-2 public health emergency.  Safety protocols were in place, including screening questions prior to the visit, additional usage of staff PPE, and extensive cleaning of exam room while observing appropriate contact time as indicated for disinfecting solutions.  Subjective:     Patient ID: Audrey [name] , female    DOB: [birth year] , 34 y.o.   MRN: [id number]   Chief Complaint  Patient presents with  . insulin resistance    HPI  Patient here for a f/u on her insulin resistance.  Wt Readings from Last 3 Encounters: 10/03/20 : 269 lb 6.4 oz (122.2 kg) 10/03/20 : 270 lb (122.5 kg) 09/10/20 : 275 lb (124.7 kg)    Past Medical History:  Diagnosis Date  . Atypical chest pain 10/03/2020  . Common migraine with intractable migraine 01/23/2017  . Diabetes mellitus without complication (HCC)   . Fetal demise 05/19/2013  . GERD (gastroesophageal reflux disease)    with pregnancy  . Gestational diabetes   . Headache    migraines  . Hemorrhage after delivery of fetus 08/12/2006  . Morbidly obese (HCC) 01/23/2017  . Preeclampsia   . Pregnancy induced hypertension   . S/P cesarean section 05/20/2013  . S/P cesarean section 11/18/2014     Family History  Adopted: Yes  Problem Relation Age of Onset  . Diabetes Mother   . Asthma Other   . Hyperlipidemia Other   . Obesity Other   . Diabetes Maternal Grandmother   . Hypertension Maternal Grandmother   . Diabetes Paternal Grandmother   . Hypertension Paternal Grandmother      Current Outpatient Medications:  .  Fremanezumab-vfrm (AJOVY) 225 MG/1.5ML SOAJ, Inject 1 pen into the skin every 30 (thirty) days., Disp: 1 pen, Rfl: 3 .  ibuprofen (ADVIL) 200 MG tablet, Take 200 mg by  mouth every 6 (six) hours as needed., Disp: , Rfl:  .  pantoprazole (PROTONIX) 20 MG tablet, Take 1 tablet (20 mg total) by mouth daily., Disp: 30 tablet, Rfl: 0 .  triamcinolone (NASACORT) 55 MCG/ACT AERO nasal inhaler, Place 1 spray into the nose 2 (two) times daily., Disp: 1 Inhaler, Rfl: 2 .  aspirin-acetaminophen-caffeine (EXCEDRIN MIGRAINE) 250-250-65 MG tablet, Take 2 tablets by mouth every 8 (eight) hours as needed for headache. (Patient not taking: Reported on 10/03/2020), Disp: , Rfl:  .  metoprolol tartrate (LOPRESSOR) 100 MG tablet, TAKE 1 TABLET 2 HOURS PRIOR TO CT (Patient not taking: Reported on 10/03/2020), Disp: 1 tablet, Rfl: 0 .  Semaglutide (RYBELSUS) 7 MG TABS, Take 1 tablet by mouth daily. Take 30 minutes before breakfast, Disp: 30 tablet, Rfl: 0 .  valACYclovir (VALTREX) 1000 MG tablet, Take at onset of symptoms of cold sore 1 tablet by mouth daily x 3 days with exacerbations, Disp: 90 tablet, Rfl: 1   Allergies  Allergen Reactions  . Imitrex [Sumatriptan]     Nausea     Review of Systems  Constitutional: Negative.   Respiratory: Negative.   Cardiovascular: Negative.  Negative for chest pain, palpitations and leg swelling.  Gastrointestinal: Negative.   Genitourinary: Negative.   Musculoskeletal: Negative.   Skin: Negative.   Psychiatric/Behavioral: Negative.      Today's Vitals   10/03/20 1143  BP: 126/80  Pulse: 78  Temp: Marland Kitchen)  97.3 F (36.3 C)  TempSrc: Oral  Weight: 269 lb 6.4 oz (122.2 kg)  Height: 5' 7.4" (1.712 m)  PainSc: 0-No pain   Body mass index is 41.69 kg/m.   Objective:  Physical Exam Vitals reviewed.  Constitutional:      General: She is not in acute distress.    Appearance: Normal appearance.  Cardiovascular:     Rate and Rhythm: Normal rate and regular rhythm.     Pulses: Normal pulses.     Heart sounds: Normal heart sounds. No murmur heard.   Pulmonary:     Effort: Pulmonary effort is normal. No respiratory distress.     Breath  sounds: Normal breath sounds.  Skin:    Capillary Refill: Capillary refill takes less than 2 seconds.  Neurological:     General: No focal deficit present.     Mental Status: She is alert and oriented to person, place, and time.     Cranial Nerves: No cranial nerve deficit.  Psychiatric:        Mood and Affect: Mood normal.        Behavior: Behavior normal.        Thought Content: Thought content normal.        Judgment: Judgment normal.         Assessment And Plan:     1. Class 3 severe obesity with body mass index (BMI) of 40.0 to 44.9 in adult, unspecified obesity type, unspecified whether serious comorbidity present (HCC)  Chronic, she had stopped taking the Rybelsus while at the 14 mg dose however when she started back she began at the 14 mg dose, encouraged her to go back to the low dose as this can caused GI upset. - Semaglutide (RYBELSUS) 7 MG TABS; Take 1 tablet by mouth daily. Take 30 minutes before breakfast  Dispense: 30 tablet; Refill: 0  2. Oral herpes simplex infection  No sores at this time however will provide an as needed rx - valACYclovir (VALTREX) 1000 MG tablet; Take at onset of symptoms of cold sore 1 tablet by mouth daily x 3 days with exacerbations  Dispense: 90 tablet; Refill: 1  3. Insulin resistance  Advised to not just discontinue medication can then caused additional GI upset. - Semaglutide (RYBELSUS) 7 MG TABS; Take 1 tablet by mouth daily. Take 30 minutes before breakfast  Dispense: 30 tablet; Refill: 0     Patient was given opportunity to ask questions. Patient verbalized understanding of the plan and was able to repeat key elements of the plan. All questions were answered to their satisfaction.  Arnette Felts, FNP   I, Arnette Felts, FNP, have reviewed all documentation for this visit. The documentation on 10/04/20 for the exam, diagnosis, procedures, and orders are all accurate and complete.  THE PATIENT IS ENCOURAGED TO PRACTICE SOCIAL  DISTANCING DUE TO THE COVID-19 PANDEMIC.

## 2020-10-03 NOTE — Patient Instructions (Signed)
Medication Instructions:  TAKE METOPROLOL 100 MG 2 HOURS  PRIOR TO CT  *If you need a refill on your cardiac medications before your next appointment, please call your pharmacy*  Lab Work: BMET 1 WEEK PRIOR TO CT  If you have labs (blood work) drawn today and your tests are completely normal, you will receive your results only by:  [place] (if you have MyChart) OR  A paper copy in the mail If you have any lab test that is abnormal or we need to change your treatment, we will call you to review the results.  Testing/Procedures: Your physician has requested that you have cardiac CT. Cardiac computed tomography (CT) is a painless test that uses an x-ray machine to take clear, detailed pictures of your heart. For further information please visit [facility]. Please follow instruction sheet as given. ONCE INSURANCE REVIEWED THE OFFICE WILL CALL YOU TO SCHEDULE. IF YOU DO NOT HEAR FROM THE OFFICE IN 2 WEEKS CALL TO FOLLOW UP   Follow-Up: AS NEEDED  Other Instructions Your cardiac CT will be scheduled at one of the below locations:   [facility] [address] [place], [place] [address] (336) [place] [address] [place], [place] [address] [phone]  If scheduled at [facility], please arrive at the [facility] main entrance of [facility] 30 minutes prior to test start time. Proceed to the [facility] Radiology Department (first floor) to check-in and test prep.  If scheduled at [facility], please arrive 15 mins early for check-in and test prep.  Please follow these instructions carefully (unless otherwise directed):  Hold all erectile dysfunction medications at least 3 days (72 hrs) prior to test.  On the Night Before the Test:  Be sure to Drink plenty of water.  Do not consume any caffeinated/decaffeinated beverages or chocolate 12  hours prior to your test.  Do not take any antihistamines 12 hours prior to your test.  If the patient has contrast allergy: ? Patient will need a prescription for Prednisone and very clear instructions (as follows): 1. Prednisone 50 mg - take 13 hours prior to test 2. Take another Prednisone 50 mg 7 hours prior to test 3. Take another Prednisone 50 mg 1 hour prior to test 4. Take Benadryl 50 mg 1 hour prior to test  Patient must complete all four doses of above prophylactic medications.  Patient will need a ride after test due to Benadryl.  On the Day of the Test:  Drink plenty of water. Do not drink any water within one hour of the test.  Do not eat any food 4 hours prior to the test.  You may take your regular medications prior to the test.   Take metoprolol (Lopressor) two hours prior to test.  HOLD Furosemide/Hydrochlorothiazide morning of the test.  FEMALES- please wear underwire-free bra if available      After the Test:  Drink plenty of water.  After receiving IV contrast, you may experience a mild flushed feeling. This is normal.  On occasion, you may experience a mild rash up to 24 hours after the test. This is not dangerous. If this occurs, you can take Benadryl 25 mg and increase your fluid intake.  If you experience trouble breathing, this can be serious. If it is severe call 911 IMMEDIATELY. If it is mild, please call our office.  If you take any of these medications: Glipizide/Metformin,  Avandament, Glucavance, please do not take 48 hours after completing test unless otherwise instructed.   Once we have confirmed authorization from your insurance company, we will call you to set up a date and time for your test. Based on how quickly your insurance processes prior authorizations requests, please allow up to 4 weeks to be contacted for scheduling your Cardiac CT appointment. Be advised that routine Cardiac CT appointments could be scheduled as many as 8 weeks  after your provider has ordered it.  For non-scheduling related questions, please contact the cardiac imaging nurse navigator should you have any questions/concerns: Marchia Bond, Cardiac Imaging Nurse Navigator Burley Saver, Interim Cardiac Imaging Nurse [facility] [facility] Direct Office Dial: [phone]   For scheduling needs, including cancellations and rescheduling, please call Vivien Rota at [phone], option 3.    Cardiac CT Angiogram A cardiac CT angiogram is a procedure to look at the heart and the area around the heart. It may be done to help find the cause of chest pains or other symptoms of heart disease. During this procedure, a substance called contrast dye is injected into the blood vessels in the area to be checked. A large X-ray machine, called a CT scanner, then takes detailed pictures of the heart and the surrounding area. The procedure is also sometimes called a coronary CT angiogram, coronary artery scanning, or CTA. A cardiac CT angiogram allows the health care provider to see how well blood is flowing to and from the heart. The health care provider will be able to see if there are any problems, such as:  Blockage or narrowing of the coronary arteries in the heart.  Fluid around the heart.  Signs of weakness or disease in the muscles, valves, and tissues of the heart. Tell a health care provider about:  Any allergies you have. This is especially important if you have had a previous allergic reaction to contrast dye.  All medicines you are taking, including vitamins, herbs, eye drops, creams, and over-the-counter medicines.  Any blood disorders you have.  Any surgeries you have had.  Any medical conditions you have.  Whether you are pregnant or may be pregnant.  Any anxiety disorders, chronic pain, or other conditions you have that may increase your stress or prevent you from lying still. What are the risks? Generally, this is a safe  procedure. However, problems may occur, including:  Bleeding.  Infection.  Allergic reactions to medicines or dyes.  Damage to other structures or organs.  Kidney damage from the contrast dye that is used.  Increased risk of cancer from radiation exposure. This risk is low. Talk with your health care provider about: ? The risks and benefits of testing. ? How you can receive the lowest dose of radiation. What happens before the procedure?  Wear comfortable clothing and remove any jewelry, glasses, dentures, and hearing aids.  Follow instructions from your health care provider about eating and drinking. This may include: ? For 12 hours before the procedure -- avoid caffeine. This includes tea, coffee, soda, energy drinks, and diet pills. Drink plenty of water or other fluids that do not have caffeine in them. Being well hydrated can prevent complications. ? For 4-6 hours before the procedure -- stop eating and drinking. The contrast dye can cause nausea, but this is less likely if your stomach is empty.  Ask your health care provider about changing or stopping your regular medicines. This is especially important if you are taking diabetes medicines,  blood thinners, or medicines to treat problems with erections (erectile dysfunction). What happens during the procedure?   Hair on your chest may need to be removed so that small sticky patches called electrodes can be placed on your chest. These will transmit information that helps to monitor your heart during the procedure.  An IV will be inserted into one of your veins.  You might be given a medicine to control your heart rate during the procedure. This will help to ensure that good images are obtained.  You will be asked to lie on an exam table. This table will slide in and out of the CT machine during the procedure.  Contrast dye will be injected into the IV. You might feel warm, or you may get a metallic taste in your mouth.  You  will be given a medicine called nitroglycerin. This will relax or dilate the arteries in your heart.  The table that you are lying on will move into the CT machine tunnel for the scan.  The person running the machine will give you instructions while the scans are being done. You may be asked to: ? Keep your arms above your head. ? Hold your breath. ? Stay very still, even if the table is moving.  When the scanning is complete, you will be moved out of the machine.  The IV will be removed. The procedure may vary among health care providers and hospitals. What can I expect after the procedure? After your procedure, it is common to have:  A metallic taste in your mouth from the contrast dye.  A feeling of warmth.  A headache from the nitroglycerin. Follow these instructions at home:  Take over-the-counter and prescription medicines only as told by your health care provider.  If you are told, drink enough fluid to keep your urine pale yellow. This will help to flush the contrast dye out of your body.  Most people can return to their normal activities right after the procedure. Ask your health care provider what activities are safe for you.  It is up to you to get the results of your procedure. Ask your health care provider, or the department that is doing the procedure, when your results will be ready.  Keep all follow-up visits as told by your health care provider. This is important. Contact a health care provider if:  You have any symptoms of allergy to the contrast dye. These include: ? Shortness of breath. ? Rash or hives. ? A racing heartbeat. Summary  A cardiac CT angiogram is a procedure to look at the heart and the area around the heart. It may be done to help find the cause of chest pains or other symptoms of heart disease.  During this procedure, a large X-ray machine, called a CT scanner, takes detailed pictures of the heart and the surrounding area after a contrast  dye has been injected into blood vessels in the area.  Ask your health care provider about changing or stopping your regular medicines before the procedure. This is especially important if you are taking diabetes medicines, blood thinners, or medicines to treat erectile dysfunction.  If you are told, drink enough fluid to keep your urine pale yellow. This will help to flush the contrast dye out of your body. This information is not intended to replace advice given to you by your health care provider. Make sure you discuss any questions you have with your health care provider. Document Revised: 08/10/2019 Document Reviewed: 08/10/2019  Elsevier Patient Education  [employer].

## 2020-10-11 ENCOUNTER — Encounter: Payer: Self-pay | Admitting: Physician Assistant

## 2020-10-11 ENCOUNTER — Ambulatory Visit (INDEPENDENT_AMBULATORY_CARE_PROVIDER_SITE_OTHER): Payer: No Typology Code available for payment source | Admitting: Physician Assistant

## 2020-10-11 ENCOUNTER — Other Ambulatory Visit: Payer: No Typology Code available for payment source

## 2020-10-11 ENCOUNTER — Other Ambulatory Visit: Payer: Self-pay | Admitting: Physician Assistant

## 2020-10-11 VITALS — BP 126/74 | HR 76 | Ht 68.0 in | Wt 270.0 lb

## 2020-10-11 DIAGNOSIS — R109 Unspecified abdominal pain: Secondary | ICD-10-CM | POA: Diagnosis not present

## 2020-10-11 DIAGNOSIS — R197 Diarrhea, unspecified: Secondary | ICD-10-CM

## 2020-10-11 DIAGNOSIS — R0789 Other chest pain: Secondary | ICD-10-CM | POA: Diagnosis not present

## 2020-10-11 MED ORDER — DICYCLOMINE HCL 10 MG PO CAPS: 10.0000 mg | ORAL_CAPSULE | Freq: Three times a day (TID) | ORAL | 2 refills | Status: DC

## 2020-10-11 MED FILL — DICYCLOMINE 10 MG CAPSULE: 10 | 22 days supply | Qty: 90 | Fill #0

## 2020-10-11 MED FILL — valACYclovir HCL 1 GM TABS: 1 | 30 days supply | Qty: 30 | Fill #0

## 2020-10-11 NOTE — Patient Instructions (Addendum)
If you are age 35 or older, your body mass index should be between 23-30. Your Body mass index is 41.05 kg/m. If this is out of the aforementioned range listed, please consider follow up with your Primary Care Provider.  If you are age 46 or younger, your body mass index should be between 19-25. Your Body mass index is 41.05 kg/m. If this is out of the aformentioned range listed, please consider follow up with your Primary Care Provider.   Your provider has requested that you go to the basement level for lab work before leaving today. Press "B" on the elevator. The lab is located at the first door on the left as you exit the elevator.  Due to recent changes in healthcare laws, you may see the results of your imaging and laboratory studies on MyChart before your provider has had a chance to review them.  We understand that in some cases there may be results that are confusing or concerning to you. Not all laboratory results come back in the same time frame and the provider may be waiting for multiple results in order to interpret others.  Please give [place] 48 hours in order for your provider to thoroughly review all the results before contacting the office for clarification of your results.   We have sent the following medications to your pharmacy for you to pick up at your convenience:  START: dicyclomine 10mg  one tablet three times daily 20-30 minutes before meals.  You are scheduled for follow up appointment on 11-27-20 at 2:00pm.  Thank you for entrusting me with your care and choosing [facility].  [facility], Hyacinth Meeker

## 2020-10-11 NOTE — Progress Notes (Signed)
Chief Complaint: Diarrhea  HPI:    Mrs. Koehne is a 34 year old African-American female with past medical history as listed below including diabetes and reflux, who was referred to me by Arnette Felts, FNP for a complaint of diarrhea.      Today, the patient presents to clinic and tells me that she has pretty much always had a bowel movement after eating for her whole life but it was typically solid and over the past 6 to 8 months this is changed to a looser stool which is even sometimes liquid.  This occurs about 30 minutes after she eats anything and is pretty urgent with preceding lower abdominal cramping which typically gets some better after she is able to get all of her stool out which may take 3 bowel movements.  Patient thought this was related to her Rybelsus which was started around the same timeframe, but she stopped it for 2 months and the symptoms only improved slightly.  She is now back on this medicine.    Also complains of some atypical chest pain for which she has been to the ER.  They think this is related to reflux, at las visit, 09/10/20, she was started on Pantoprazole 20 mg daily to see if this helps prevent any further episodes which typically occur about once a month and last for 2 to 3 days and radiate into her back/ between her shoulder blades.  Patient has not had another episode since being on the Pantoprazole yet.    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    Denies fever, chills, blood in her stool, weight loss or symptoms that awaken her from sleep.     Past Medical History:  Diagnosis Date   Atypical chest pain 10/03/2020   Common migraine with intractable migraine 01/23/2017   Diabetes mellitus without complication (HCC)    Fetal demise 05/19/2013   GERD (gastroesophageal reflux disease)    with pregnancy   Gestational diabetes    Headache    migraines   Hemorrhage after delivery of fetus 08/12/2006   Morbidly obese (HCC) 01/23/2017   Preeclampsia     Pregnancy induced hypertension    S/P cesarean section 05/20/2013   S/P cesarean section 11/18/2014    Past Surgical History:  Procedure Laterality Date   CESAREAN SECTION     CESAREAN SECTION N/A 05/19/2013   Procedure: CESAREAN SECTION;  Surgeon: Sherron Monday, MD;  Location: [facility];  Service: Obstetrics;  Laterality: N/A;   CESAREAN SECTION WITH BILATERAL TUBAL LIGATION Bilateral 11/17/2014   Procedure: CESAREAN SECTION WITH BILATERAL TUBAL LIGATION;  Surgeon: Sherian Rein, MD;  Location: [facility];  Service: Obstetrics;  Laterality: Bilateral;   TOENAIL EXCISION     WISDOM TOOTH EXTRACTION      Current Outpatient Medications  Medication Sig Dispense Refill   aspirin-acetaminophen-caffeine (EXCEDRIN MIGRAINE) 250-250-65 MG tablet Take 2 tablets by mouth every 8 (eight) hours as needed for headache. (Patient not taking: Reported on 10/03/2020)     Fremanezumab-vfrm (AJOVY) 225 MG/1.5ML SOAJ Inject 1 pen into the skin every 30 (thirty) days. 1 pen 3   ibuprofen (ADVIL) 200 MG tablet Take 200 mg by mouth every 6 (six) hours as needed.     metoprolol tartrate (LOPRESSOR) 100 MG tablet TAKE 1 TABLET 2 HOURS PRIOR TO CT (Patient not taking: Reported on 10/03/2020) 1 tablet 0   pantoprazole (PROTONIX) 20 MG tablet Take 1 tablet (20 mg total) by mouth daily. 30 tablet 0   Semaglutide (RYBELSUS)  7 MG TABS Take 1 tablet by mouth daily. Take 30 minutes before breakfast 30 tablet 0   triamcinolone (NASACORT) 55 MCG/ACT AERO nasal inhaler Place 1 spray into the nose 2 (two) times daily. 1 Inhaler 2   valACYclovir (VALTREX) 1000 MG tablet Take at onset of symptoms of cold sore 1 tablet by mouth daily x 3 days with exacerbations 90 tablet 1   No current facility-administered medications for this visit.    Allergies as of 10/11/2020 - Review Complete 10/03/2020  Allergen Reaction Noted   Imitrex [sumatriptan]  01/23/2017    Family History  Adopted: Yes  Problem Relation Age of  Onset   Diabetes Mother    Asthma Other    Hyperlipidemia Other    Obesity Other    Diabetes Maternal Grandmother    Hypertension Maternal Grandmother    Diabetes Paternal Grandmother    Hypertension Paternal Grandmother     Social History   Socioeconomic History   Marital status: Single    Spouse name: Not on file   Number of children: 1   Years of education: 12+   Highest education level: Not on file  Occupational History    Employer: [employer]  Tobacco Use   Smoking status: Never Smoker   Smokeless tobacco: Never Used  Building services engineer Use: Never used  Substance and Sexual Activity   Alcohol use: No   Drug use: No   Sexual activity: Not Currently  Other Topics Concern   Not on file  Social History Narrative   Patient lives at home with daughter.    Patient has has 1 child and one on the way.    Patient has some college education.    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          Social Determinants of Health   Financial Resource Strain:    Difficulty of Paying Living Expenses: Not on file  Food Insecurity:    Worried About Programme researcher, broadcasting/film/video in the Last Year: Not on file   [employer] of Food in the Last Year: Not on file  Transportation Needs:    Lack of Transportation (Medical): Not on file   Lack of Transportation (Non-Medical): Not on file  Physical Activity:    Days of Exercise per Week: Not on file   Minutes of Exercise per Session: Not on file  Stress:    Feeling of Stress : Not on file  Social Connections:    Frequency of Communication with Friends and Family: Not on file   Frequency of Social Gatherings with Friends and Family: Not on file   Attends Religious Services: Not on file   Active Member of Clubs or Organizations: Not on file   Attends Banker Meetings: Not on file   Marital Status: Not on file  Intimate Partner Violence:    Fear of Current or Ex-Partner: Not on file   Emotionally Abused: Not on  file   Physically Abused: Not on file   Sexually Abused: Not on file    Review of Systems:    Constitutional: No weight loss, fever or chills Skin: No rash  Cardiovascular: No chest pain Respiratory: No SOB  Gastrointestinal: See HPI and otherwise negative Genitourinary: No dysuria  Neurological: No headache, dizziness or syncope Musculoskeletal: No new muscle or joint pain Hematologic: No bleeding  Psychiatric: No history of depression or anxiety   Physical Exam:  Vital signs: BP 126/74    Pulse 76  Ht 5\' 8"  (1.727 m)    Wt 270 lb (122.5 kg)    BMI 41.05 kg/m   Constitutional:   Pleasant obese AA female appears to be in NAD, Well developed, Well nourished, alert and cooperative Head:  Normocephalic and atraumatic. Eyes:   PEERL, EOMI. No icterus. Conjunctiva pink. Ears:  Normal auditory acuity. Neck:  Supple Throat: Oral cavity and pharynx without inflammation, swelling or lesion.  Respiratory: Respirations even and unlabored. Lungs clear to auscultation bilaterally.   No wheezes, crackles, or rhonchi.  Cardiovascular: Normal S1, S2. No MRG. Regular rate and rhythm. No peripheral edema, cyanosis or pallor.  Gastrointestinal:  Soft, nondistended, nontender. No rebound or guarding. Normal bowel sounds. No appreciable masses or hepatomegaly. Rectal:  Not performed.  Msk:  Symmetrical without gross deformities. Without edema, no deformity or joint abnormality.  Neurologic:  Alert and  oriented x4;  grossly normal neurologically.  Skin:   Dry and intact without significant lesions or rashes. Psychiatric:  Demonstrates good judgement and reason without abnormal affect or behaviors.  RELEVANT LABS AND IMAGING: CBC    Component Value Date/Time   WBC 9.8 09/10/2020 1035   RBC 4.66 09/10/2020 1035   HGB 11.3 (L) 09/10/2020 1035   HGB 10.2 (L) 08/20/2020 1508   HCT 38.7 09/10/2020 1035   HCT 33.9 (L) 08/20/2020 1508   PLT 388 09/10/2020 1035   PLT 402 08/20/2020 1508   MCV  83.0 09/10/2020 1035   MCV 80 08/20/2020 1508   MCH 24.2 (L) 09/10/2020 1035   MCHC 29.2 (L) 09/10/2020 1035   RDW 16.3 (H) 09/10/2020 1035   RDW 15.8 (H) 08/20/2020 1508   LYMPHSABS 3.2 02/06/2014 1222   MONOABS 0.8 02/06/2014 1222   EOSABS 0.2 02/06/2014 1222   BASOSABS 0.0 02/06/2014 1222    CMP     Component Value Date/Time   NA 138 09/10/2020 1035   NA 141 08/20/2020 1508   K 3.6 09/10/2020 1035   CL 102 09/10/2020 1035   CO2 25 09/10/2020 1035   GLUCOSE 92 09/10/2020 1035   BUN 12 09/10/2020 1035   BUN 9 08/20/2020 1508   CREATININE 0.70 09/10/2020 1035   CREATININE 0.70 08/16/2019 1524   CALCIUM 9.1 09/10/2020 1035   PROT 7.1 08/20/2020 1508   ALBUMIN 4.2 08/20/2020 1508   AST 20 08/20/2020 1508   ALT 17 08/20/2020 1508   ALKPHOS 77 08/20/2020 1508   BILITOT 0.5 08/20/2020 1508   GFRNONAA >60 09/10/2020 1035   GFRAA >60 09/10/2020 1035    Assessment: 1.  Diarrhea: Over the past 6 to 8 months, typically 30 minutes after eating with lower abdominal cramping, no increase in stress; consider IBS versus other 2.  Lower abdominal cramping: With above, typically relieved after bowel movement 3.  Atypical chest pain: Seen in the ER multiple occasions, cardiac etiology ruled out, they think this is related to reflux, has not had another episode on Pantoprazole 20 mg daily  Plan: 1.  Ordered a GI pathogen panel 2.  Recommend the patient continue her Pantoprazole 20 mg daily 3.  Started the patient on Dicyclomine 10 mg 3 times daily, 20-30 minutes before meals #90 with 3 refills. 4.  Discussed IBS.  Explained patient's symptoms do not get any better with medication above then she may need further work-up with EGD/colonoscopy given atypical chest pain as well. 5.  Patient to follow in clinic with me in 6-8 weeks or sooner if necessary.  She was assigned to Dr. Orvan FalconerBeavers  this afternoon.  Hyacinth Meeker, PA-C [facility] 10/11/2020, 1:13 PM  Cc: Arnette Felts,  FNP

## 2020-10-11 NOTE — Progress Notes (Signed)
Reviewed and agree with management plans. ? ?Ellieana Dolecki L. Adan Beal, MD, MPH  ?

## 2020-10-30 ENCOUNTER — Encounter: Payer: Self-pay | Admitting: Podiatry

## 2020-10-31 ENCOUNTER — Ambulatory Visit: Payer: No Typology Code available for payment source | Admitting: Podiatry

## 2020-11-12 MED FILL — AJOVY 225 MG/1.5ML SOAJ: 225 | 30 days supply | Qty: 2 | Fill #3

## 2020-11-26 ENCOUNTER — Ambulatory Visit: Payer: No Typology Code available for payment source | Admitting: Nurse Practitioner

## 2020-11-27 ENCOUNTER — Ambulatory Visit: Payer: No Typology Code available for payment source | Admitting: Physician Assistant

## 2020-12-11 ENCOUNTER — Other Ambulatory Visit: Payer: Self-pay | Admitting: Nurse Practitioner

## 2020-12-11 DIAGNOSIS — G43009 Migraine without aura, not intractable, without status migrainosus: Secondary | ICD-10-CM

## 2020-12-12 ENCOUNTER — Other Ambulatory Visit: Payer: Self-pay | Admitting: Nurse Practitioner

## 2020-12-12 MED FILL — AJOVY 225 MG/1.5ML SOAJ: 225 | 30 days supply | Qty: 2 | Fill #0

## 2020-12-24 ENCOUNTER — Encounter: Payer: Self-pay | Admitting: Nurse Practitioner

## 2020-12-24 ENCOUNTER — Other Ambulatory Visit: Payer: Self-pay | Admitting: Nurse Practitioner

## 2020-12-24 ENCOUNTER — Ambulatory Visit (INDEPENDENT_AMBULATORY_CARE_PROVIDER_SITE_OTHER): Payer: No Typology Code available for payment source | Admitting: Nurse Practitioner

## 2020-12-24 ENCOUNTER — Other Ambulatory Visit: Payer: Self-pay

## 2020-12-24 VITALS — BP 138/90 | HR 71 | Temp 97.7°F | Ht 68.0 in | Wt 274.0 lb

## 2020-12-24 DIAGNOSIS — R030 Elevated blood-pressure reading, without diagnosis of hypertension: Secondary | ICD-10-CM | POA: Diagnosis not present

## 2020-12-24 DIAGNOSIS — E88819 Insulin resistance, unspecified: Secondary | ICD-10-CM

## 2020-12-24 DIAGNOSIS — R42 Dizziness and giddiness: Secondary | ICD-10-CM | POA: Diagnosis not present

## 2020-12-24 DIAGNOSIS — G43009 Migraine without aura, not intractable, without status migrainosus: Secondary | ICD-10-CM | POA: Diagnosis not present

## 2020-12-24 DIAGNOSIS — E8881 Metabolic syndrome: Secondary | ICD-10-CM | POA: Diagnosis not present

## 2020-12-24 MED ORDER — KETOROLAC TROMETHAMINE 60 MG/2ML IM SOLN
60.0000 mg | Freq: Once | INTRAMUSCULAR | Status: AC
  Administered 2020-12-24: 60 mg via INTRAMUSCULAR

## 2020-12-24 MED ORDER — MAGNESIUM 250 MG PO TABS: 1.0000 | ORAL_TABLET | Freq: Every evening | ORAL | 0 refills | Status: DC

## 2020-12-24 MED ORDER — TRIAMCINOLONE ACETONIDE 40 MG/ML IJ SUSP
40.0000 mg | Freq: Once | INTRAMUSCULAR | Status: AC
  Administered 2020-12-24: 40 mg via INTRAMUSCULAR

## 2020-12-24 MED ORDER — NURTEC 75 MG PO TBDP: 1.0000 | ORAL_TABLET | Freq: Every day | ORAL | 2 refills | Status: DC | PRN

## 2020-12-24 MED FILL — NURTEC 75 MG TBDP: 75 | 30 days supply | Qty: 16 | Fill #0

## 2020-12-24 NOTE — Progress Notes (Signed)
Rutherford Nail as a scribe for Minette Brine, FNP.,have documented all relevant documentation on the behalf of Minette Brine, FNP,as directed by  Minette Brine, FNP while in the presence of Minette Brine, [place].  This visit occurred during the SARS-CoV-2 public health emergency.  Safety protocols were in place, including screening questions prior to the visit, additional usage of staff PPE, and extensive cleaning of exam room while observing appropriate contact time as indicated for disinfecting solutions.  Subjective:     Patient ID: Audrey [name] , female    DOB: [birth year] , 34 y.o.   MRN: [id number]   Chief Complaint  Patient presents with  . Headache    HPI  Pt here today for headaches.    She has been taking Ajovy which has been doing well. She has had lack of sleep.  She has taken [place] previously but was not effective. She is not exercising regularly. She does admit to drinking not enough water and will drink sodas. She has cut back on bread and pastas. She does admit to eating more sweets than what she should.  Feels like she is not resting her mind. She will take melatonin gummies.  She has noticed this in the last 6 months, she has a new position at work.  Her job is not as physical.     Headache  This is a chronic problem. The current episode started more than 1 year ago. The problem occurs constantly. The problem has been gradually worsening. The pain is located in the temporal region. Pertinent negatives include no dizziness. She has tried triptans (she has taken excedrin migraine and occasionally will take ibuprofen - 800 mg two times a day for the last week.Marland Kitchen allergic to triptans.) for the symptoms. There is no history of cancer or cluster headaches.     Past Medical History:  Diagnosis Date  . Arthritis   . Atypical chest pain 10/03/2020  . Common migraine with intractable migraine 01/23/2017  . Diabetes mellitus without complication ([place])   . Fetal demise  05/19/2013  . GERD (gastroesophageal reflux disease)    with pregnancy  . Gestational diabetes   . Headache    migraines  . Hemorrhage after delivery of fetus 08/12/2006  . Morbidly obese ([place]) 01/23/2017  . Preeclampsia   . Pregnancy induced hypertension   . S/P cesarean section 05/20/2013  . S/P cesarean section 11/18/2014     Family History  Adopted: Yes  Problem Relation Age of Onset  . Diabetes Mother   . Asthma Other   . Hyperlipidemia Other   . Obesity Other   . Diabetes Maternal Grandmother   . Hypertension Maternal Grandmother   . Diabetes Paternal Grandmother   . Hypertension Paternal Grandmother      Current Outpatient Medications:  .  AJOVY 225 MG/1.5ML SOAJ, INJECT 1 PEN INTO THE SKIN EVERY 30 (THIRTY) DAYS., Disp: 1.5 mL, Rfl: 3 .  aspirin-acetaminophen-caffeine (EXCEDRIN MIGRAINE) 250-250-65 MG tablet, Take 2 tablets by mouth every 8 (eight) hours as needed for headache. , Disp: , Rfl:  .  dicyclomine (BENTYL) 10 MG capsule, Take 1 capsule (10 mg total) by mouth 4 (four) times daily -  before meals and at bedtime., Disp: 90 capsule, Rfl: 2 .  ibuprofen (ADVIL) 200 MG tablet, Take 200 mg by mouth every 6 (six) hours as needed., Disp: , Rfl:  .  Magnesium 250 MG TABS, Take 1 tablet (250 mg total) by mouth every evening. Take with evening meal, Disp:  30 tablet, Rfl: 0 .  metoprolol tartrate (LOPRESSOR) 100 MG tablet, TAKE 1 TABLET 2 HOURS PRIOR TO CT, Disp: 1 tablet, Rfl: 0 .  pantoprazole (PROTONIX) 20 MG tablet, Take 1 tablet (20 mg total) by mouth daily., Disp: 30 tablet, Rfl: 0 .  Rimegepant Sulfate (NURTEC) 75 MG TBDP, Take 1 tablet by mouth daily as needed., Disp: 30 tablet, Rfl: 2 .  Semaglutide (RYBELSUS) 7 MG TABS, Take 1 tablet by mouth daily. Take 30 minutes before breakfast, Disp: 30 tablet, Rfl: 0 .  triamcinolone (NASACORT) 55 MCG/ACT AERO nasal inhaler, Place 1 spray into the nose 2 (two) times daily., Disp: 1 Inhaler, Rfl: 2 .  valACYclovir (VALTREX)  1000 MG tablet, Take at onset of symptoms of cold sore 1 tablet by mouth daily x 3 days with exacerbations, Disp: 90 tablet, Rfl: 1   Allergies  Allergen Reactions  . Imitrex [Sumatriptan]     Nausea     Review of Systems  Constitutional: Negative.  Negative for fatigue.  HENT: Negative.   Endocrine: Negative for polydipsia, polyphagia and polyuria.  Musculoskeletal: Negative.   Skin: Negative.   Neurological: Negative for dizziness and headaches.  Psychiatric/Behavioral: Negative.      Today's Vitals   12/24/20 1158 12/24/20 1200  BP:  138/90  Pulse:  71  Temp: 97.7 F (36.5 C)   TempSrc: Oral   Weight: 274 lb (124.3 kg)   Height: 5' 8"  (1.727 m)   PainSc: 9    PainLoc: Head    Body mass index is 41.66 kg/m.   Objective:  Physical Exam Constitutional:      General: She is not in acute distress.    Appearance: She is well-developed. She is obese.  HENT:     Head: Normocephalic.  Cardiovascular:     Rate and Rhythm: Normal rate and regular rhythm.  Pulmonary:     Effort: Pulmonary effort is normal.     Breath sounds: Normal breath sounds.  Skin:    General: Skin is warm and dry.  Neurological:     Mental Status: She is alert and oriented to person, place, and time.     Cranial Nerves: No cranial nerve deficit.  Psychiatric:        Mood and Affect: Mood normal. Mood is not anxious.        Speech: Speech normal.        Behavior: Behavior normal.         Assessment And Plan:     1. Migraine without aura and without status migrainosus, not intractable  She has had a consistent headache for the last week, excedrin migraine and ibuprofen have been ineffective. She has tried [place] without good benefit. She has seen neurology several years ago, will refer back to Neurology. Will try her on Nurtec pending insurance coverage - I have given her a sample from the office. Will treat with kenalog and toradol injection to help break the cycle.  - Ambulatory referral  to Neurology - Magnesium 250 MG TABS; Take 1 tablet (250 mg total) by mouth every evening. Take with evening meal  Dispense: 30 tablet; Refill: 0 - Rimegepant Sulfate (NURTEC) 75 MG TBDP; Take 1 tablet by mouth daily as needed.  Dispense: 30 tablet; Refill: 2 - ketorolac (TORADOL) injection 60 mg - triamcinolone acetonide (KENALOG-40) injection 40 mg  2. Elevated blood-pressure reading, without diagnosis of hypertension  Unsure if elevated blood pressure is related to headache or if actual elevated blood pressure  She  is to take magnesium with evening meal  3. Dizziness  Will check thyroid levels  Could be related to migraine vs dehydration  Negative orthostats - TSH  4. Insulin resistance  Continue with rybelsus   Will check insulin levels and HgbA1c. - Hemoglobin A1c - Insulin, random - BMP8+eGFR     Patient was given opportunity to ask questions. Patient verbalized understanding of the plan and was able to repeat key elements of the plan. All questions were answered to their satisfaction.  Minette Brine, FNP   I, Minette Brine, FNP, have reviewed all documentation for this visit. The documentation on 12/24/20 for the exam, diagnosis, procedures, and orders are all accurate and complete.   THE PATIENT IS ENCOURAGED TO PRACTICE SOCIAL DISTANCING DUE TO THE COVID-19 PANDEMIC.

## 2020-12-24 NOTE — Patient Instructions (Signed)

## 2020-12-25 ENCOUNTER — Encounter: Payer: Self-pay | Admitting: Nurse Practitioner

## 2020-12-26 LAB — BMP8+EGFR
BUN/Creatinine Ratio: 14 (ref 9–23)
BUN: 10 mg/dL (ref 6–20)
CO2: 22 mmol/L (ref 20–29)
Calcium: 8.9 mg/dL (ref 8.7–10.2)
Chloride: 103 mmol/L (ref 96–106)
Creatinine, Ser: 0.72 mg/dL (ref 0.57–1.00)
GFR calc Af Amer: 125 mL/min/{1.73_m2} (ref >59)
GFR calc non Af Amer: 109 mL/min/{1.73_m2} (ref >59)
Glucose: 61 mg/dL — ABNORMAL LOW (ref 65–99)
Potassium: 4.2 mmol/L (ref 3.5–5.2)
Sodium: 140 mmol/L (ref 134–144)

## 2020-12-26 LAB — HEMOGLOBIN A1C
Est. average glucose Bld gHb Est-mCnc: 94 mg/dL
Hgb A1c MFr Bld: 4.9 % (ref 4.8–5.6)

## 2020-12-26 LAB — TSH: TSH: 1.47 u[IU]/mL (ref 0.450–4.500)

## 2020-12-26 LAB — INSULIN, RANDOM: INSULIN: 35.4 u[IU]/mL — ABNORMAL HIGH (ref 2.6–24.9)

## 2020-12-27 ENCOUNTER — Ambulatory Visit: Payer: Self-pay | Admitting: Nurse Practitioner

## 2021-01-08 ENCOUNTER — Encounter: Payer: Self-pay | Admitting: Nurse Practitioner

## 2021-01-08 ENCOUNTER — Ambulatory Visit (INDEPENDENT_AMBULATORY_CARE_PROVIDER_SITE_OTHER): Payer: No Typology Code available for payment source | Admitting: Nurse Practitioner

## 2021-01-08 ENCOUNTER — Other Ambulatory Visit: Payer: Self-pay

## 2021-01-08 ENCOUNTER — Ambulatory Visit: Payer: No Typology Code available for payment source

## 2021-01-08 ENCOUNTER — Other Ambulatory Visit: Payer: Self-pay | Admitting: Nurse Practitioner

## 2021-01-08 VITALS — BP 124/80 | HR 58 | Temp 97.8°F | Ht 68.2 in | Wt 277.4 lb

## 2021-01-08 DIAGNOSIS — L709 Acne, unspecified: Secondary | ICD-10-CM | POA: Diagnosis not present

## 2021-01-08 DIAGNOSIS — L309 Dermatitis, unspecified: Secondary | ICD-10-CM | POA: Diagnosis not present

## 2021-01-08 DIAGNOSIS — R030 Elevated blood-pressure reading, without diagnosis of hypertension: Secondary | ICD-10-CM

## 2021-01-08 MED ORDER — CLINDAMYCIN PHOSPHATE 1 % EX SOLN: CUTANEOUS | 0 refills | Status: DC

## 2021-01-08 MED ORDER — CLOBETASOL PROPIONATE 0.05 % EX LOTN: 1.0000 "application " | TOPICAL_LOTION | Freq: Two times a day (BID) | CUTANEOUS | 0 refills | Status: DC

## 2021-01-08 MED FILL — CLINDAMYCIN PH 1% SOLUTION: 1 | 14 days supply | Qty: 60 | Fill #0

## 2021-01-08 MED FILL — CLOBETASOL 0.05% TOPICAL LO: 0.05 | 30 days supply | Qty: 118 | Fill #0

## 2021-01-08 NOTE — Progress Notes (Signed)
I,Yamilka Roman [employer] as a Neurosurgeon for [employer], FNP.,have documented all relevant documentation on the behalf of Arnette Felts, FNP,as directed by  Arnette Felts, FNP while in the presence of Arnette Felts, FNP. This visit occurred during the SARS-CoV-2 public health emergency.  Safety protocols were in place, including screening questions prior to the visit, additional usage of staff PPE, and extensive cleaning of exam room while observing appropriate contact time as indicated for disinfecting solutions.  Subjective:     Patient ID: Audrey [name] , female    DOB: March 19, 1986 , 35 y.o.   MRN: [id number]   Chief Complaint  Patient presents with  . Hypertension  . Rash    Patient stated she has a rash on her face and she is unsure if the injection she got last triggered it     HPI  Patient presents today for a blood pressure check. She also reports having a rash on her face and hands.  Reports she has always had problems with her face and acne. She has tried proactiv in the past.  She had seen a dermatologist.  She uses tumeric soap. She has an exfoliating brush. When her menstrual cycle she does have a flare.   Hypertension This is a chronic problem. The current episode started more than 1 year ago. Pertinent negatives include no headaches.  Rash This is a chronic problem. The current episode started 1 to 4 weeks ago. The problem has been gradually improving since onset. Pertinent negatives include no fatigue.     Past Medical History:  Diagnosis Date  . Arthritis   . Atypical chest pain 10/03/2020  . Common migraine with intractable migraine 01/23/2017  . Diabetes mellitus without complication (HCC)   . Fetal demise 05/19/2013  . GERD (gastroesophageal reflux disease)    with pregnancy  . Gestational diabetes   . Headache    migraines  . Hemorrhage after delivery of fetus 08/12/2006  . Morbidly obese (HCC) 01/23/2017  . Preeclampsia   . Pregnancy induced hypertension   .  S/P cesarean section 05/20/2013  . S/P cesarean section 11/18/2014     Family History  Adopted: Yes  Problem Relation Age of Onset  . Diabetes Mother   . Asthma Other   . Hyperlipidemia Other   . Obesity Other   . Diabetes Maternal Grandmother   . Hypertension Maternal Grandmother   . Diabetes Paternal Grandmother   . Hypertension Paternal Grandmother      Current Outpatient Medications:  .  AJOVY 225 MG/1.5ML SOAJ, INJECT 1 PEN INTO THE SKIN EVERY 30 (THIRTY) DAYS., Disp: 1.5 mL, Rfl: 3 .  aspirin-acetaminophen-caffeine (EXCEDRIN MIGRAINE) 250-250-65 MG tablet, Take 2 tablets by mouth every 8 (eight) hours as needed for headache. , Disp: , Rfl:  .  clindamycin (CLEOCIN-T) 1 % external solution, Apply to affected area 2 times daily, Disp: 60 mL, Rfl: 0 .  Clobetasol Propionate 0.05 % lotion, Apply 1 application topically 2 (two) times daily., Disp: 118 mL, Rfl: 0 .  dicyclomine (BENTYL) 10 MG capsule, Take 1 capsule (10 mg total) by mouth 4 (four) times daily -  before meals and at bedtime., Disp: 90 capsule, Rfl: 2 .  ibuprofen (ADVIL) 200 MG tablet, Take 200 mg by mouth every 6 (six) hours as needed., Disp: , Rfl:  .  Magnesium 250 MG TABS, Take 1 tablet (250 mg total) by mouth every evening. Take with evening meal, Disp: 30 tablet, Rfl: 0 .  metoprolol tartrate (LOPRESSOR) 100  MG tablet, TAKE 1 TABLET 2 HOURS PRIOR TO CT, Disp: 1 tablet, Rfl: 0 .  pantoprazole (PROTONIX) 20 MG tablet, Take 1 tablet (20 mg total) by mouth daily., Disp: 30 tablet, Rfl: 0 .  Rimegepant Sulfate (NURTEC) 75 MG TBDP, Take 1 tablet by mouth daily as needed., Disp: 30 tablet, Rfl: 2 .  Semaglutide (RYBELSUS) 7 MG TABS, Take 1 tablet by mouth daily. Take 30 minutes before breakfast, Disp: 30 tablet, Rfl: 0 .  triamcinolone (NASACORT) 55 MCG/ACT AERO nasal inhaler, Place 1 spray into the nose 2 (two) times daily., Disp: 1 Inhaler, Rfl: 2 .  valACYclovir (VALTREX) 1000 MG tablet, Take at onset of symptoms of  cold sore 1 tablet by mouth daily x 3 days with exacerbations, Disp: 90 tablet, Rfl: 1   Allergies  Allergen Reactions  . Imitrex [Sumatriptan]     Nausea     Review of Systems  Constitutional: Negative.  Negative for fatigue.  HENT: Negative.   Endocrine: Negative for polydipsia, polyphagia and polyuria.  Musculoskeletal: Negative.   Skin: Positive for rash.  Neurological: Negative for dizziness and headaches.  Psychiatric/Behavioral: Negative.      Today's Vitals   01/08/21 1204  BP: 124/80  Pulse: (!) 58  Temp: 97.8 F (36.6 C)  TempSrc: Oral  Weight: 277 lb 6.4 oz (125.8 kg)  Height: 5' 8.2" (1.732 m)  PainSc: 0-No pain   Body mass index is 41.93 kg/m.   Objective:  Physical Exam Constitutional:      General: She is not in acute distress.    Appearance: She is well-developed. She is obese.  HENT:     Head: Normocephalic.  Cardiovascular:     Rate and Rhythm: Normal rate and regular rhythm.  Pulmonary:     Effort: Pulmonary effort is normal. No respiratory distress.     Breath sounds: Normal breath sounds. No wheezing.  Skin:    General: Skin is warm and dry.     Findings: Rash (papules present to the face. she also has a dry rash to her hands) present.  Neurological:     Mental Status: She is alert and oriented to person, place, and time.     Cranial Nerves: No cranial nerve deficit.  Psychiatric:        Mood and Affect: Mood normal. Mood is not anxious.        Speech: Speech normal.        Behavior: Behavior normal.        Thought Content: Thought content normal.        Judgment: Judgment normal.         Assessment And Plan:     1. Elevated blood-pressure reading, without diagnosis of hypertension  Today blood pressure is normal  Encouraged to continue to avoid high salt diet  2. Acne, unspecified acne type  Will refer to dermatology at patient request  She has papules present to face will treat with clindamycin facial wash - Ambulatory  referral to Dermatology - clindamycin (CLEOCIN-T) 1 % external solution; Apply to affected area 2 times daily  Dispense: 60 mL; Refill: 0  3. Dermatitis  Dermatitis vs eczema  She is to apply clobetasol to her hands which has a dry rash - Clobetasol Propionate 0.05 % lotion; Apply 1 application topically 2 (two) times daily.  Dispense: 118 mL; Refill: 0     Patient was given opportunity to ask questions. Patient verbalized understanding of the plan and was able to repeat key elements  of the plan. All questions were answered to their satisfaction.  Arnette Felts, FNP   I, Arnette Felts, FNP, have reviewed all documentation for this visit. The documentation on 01/16/21 for the exam, diagnosis, procedures, and orders are all accurate and complete.   THE PATIENT IS ENCOURAGED TO PRACTICE SOCIAL DISTANCING DUE TO THE COVID-19 PANDEMIC.

## 2021-01-16 ENCOUNTER — Encounter: Payer: Self-pay | Admitting: Nurse Practitioner

## 2021-01-29 ENCOUNTER — Encounter: Payer: Self-pay | Admitting: Orthopaedic Surgery

## 2021-01-29 ENCOUNTER — Telehealth: Payer: Self-pay

## 2021-01-29 NOTE — Telephone Encounter (Signed)
Noted  

## 2021-01-29 NOTE — Telephone Encounter (Signed)
Patient wants to get approval for right knee visco injections. This is Dr.Whitfield's patient. It looks like it was requested in July of last year. Thanks!

## 2021-01-31 ENCOUNTER — Other Ambulatory Visit: Payer: Self-pay

## 2021-01-31 ENCOUNTER — Ambulatory Visit (INDEPENDENT_AMBULATORY_CARE_PROVIDER_SITE_OTHER): Payer: No Typology Code available for payment source | Admitting: Orthopaedic Surgery

## 2021-01-31 ENCOUNTER — Encounter: Payer: Self-pay | Admitting: Orthopaedic Surgery

## 2021-01-31 VITALS — Ht 68.0 in | Wt 277.0 lb

## 2021-01-31 DIAGNOSIS — M1711 Unilateral primary osteoarthritis, right knee: Secondary | ICD-10-CM | POA: Diagnosis not present

## 2021-01-31 MED ORDER — BUPIVACAINE HCL 0.25 % IJ SOLN
2.0000 mL | INTRAMUSCULAR | Status: AC | PRN
  Administered 2021-01-31: 2 mL via INTRA_ARTICULAR

## 2021-01-31 NOTE — Progress Notes (Signed)
Office Visit Note   Patient: Audrey [name]           Date of Birth: October 06, 1986           MRN: [id number] Visit Date: 01/31/2021              Requested by: Arnette Felts, FNP [address] [place],  [place] [address] PCP: Arnette Felts, FNP   Assessment & Plan: Visit Diagnoses:  1. Unilateral primary osteoarthritis, right knee     Plan: Recurrent symptoms of osteoarthritis right knee.  Did well with her last injection in March 2021.  She recently moved this past weekend with increasing pain but no injury or trauma.  Will reinject her knee and recertify Visco supplementation. This patient is diagnosed with osteoarthritis of the knee(s).    Radiographs show evidence of joint space narrowing, osteophytes, subchondral sclerosis and/or subchondral cysts.  This patient has knee pain which interferes with functional and activities of daily living.    This patient has experienced inadequate response, adverse effects and/or intolerance with conservative treatments such as acetaminophen, NSAIDS, topical creams, physical therapy or regular exercise, knee bracing and/or weight loss.   This patient has experienced inadequate response or has a contraindication to intra articular steroid injections for at least 3 months.   This patient is not scheduled to have a total knee replacement within 6 months of starting treatment with viscosupplementation.   Follow-Up Instructions: Return if symptoms worsen or fail to improve.   Orders:  Orders Placed This Encounter  Procedures  . Large Joint Inj: R knee   No orders of the defined types were placed in this encounter.     Procedures: Large Joint Inj: R knee on 01/31/2021 2:59 PM Indications: pain and diagnostic evaluation Details: 25 G 1.5 in needle, anteromedial approach  Arthrogram: No  Medications: 2 mL bupivacaine 0.25 %  12 mg betamethasone injected with Marcaine right knee Procedure, treatment alternatives, risks and  benefits explained, specific risks discussed. Consent was given by the patient. Immediately prior to procedure a time out was called to verify the correct patient, procedure, equipment, support staff and site/side marked as required. Patient was prepped and draped in the usual sterile fashion.       Clinical Data: No additional findings.   Subjective: Chief Complaint  Patient presents with  . Right Knee - Pain  Patient presents today for her right knee. She was last here in July of 2021 and received a cortisone injection. Patient states that she started hurting a few days. She just recently moved into a home with steps. She states that she is maybe over doing it and causing her knee to hurt. She wants to get another injection today. We are waiting to hear from insurance about starting gel injections.  HPI  Review of Systems   Objective: Vital Signs: Ht 5\' 8"  (1.727 m)   Wt 277 lb (125.6 kg)   BMI 42.12 kg/m   Physical Exam Constitutional:      Appearance: She is well-developed and well-nourished.  HENT:     Mouth/Throat:     Mouth: Oropharynx is clear and moist.  Eyes:     Extraocular Movements: EOM normal.     Pupils: Pupils are equal, round, and reactive to light.  Pulmonary:     Effort: Pulmonary effort is normal.  Skin:    General: Skin is warm and dry.  Neurological:     Mental Status: She is alert and oriented to  person, place, and time.  Psychiatric:        Mood and Affect: Mood and affect normal.        Behavior: Behavior normal.     Ortho Exam awake alert and oriented x3.  Comfortable sitting.  Little bit of discomfort along the medial aspect of her right knee but the knee was not hot warm or red.  No effusion.  No instability.  Full extension and flexion over 100 degrees.  No popliteal pain or mass.  No calf pain. painless range of motion both hips  Specialty Comments:  No specialty comments available.  Imaging: No results found.   PMFS  History: Patient Active Problem List   Diagnosis Date Noted  . Atypical chest pain 10/03/2020  . Unilateral primary osteoarthritis, right knee 07/13/2020  . Dizziness 08/16/2019  . Cephalalgia 08/16/2019  . Class 3 severe obesity with body mass index (BMI) of 40.0 to 44.9 in adult [facility]) 01/27/2019  . Chronic pain of right knee 01/27/2019  . History of gestational diabetes 01/27/2019  . Breast lump on left side at 3 o'clock position 01/27/2019  . Cesarean delivery delivered 01/26/2019  . Gastroesophageal reflux disease 01/26/2019  . Morbidly obese (HCC) 01/23/2017  . Common migraine with intractable migraine 01/23/2017  . S/P cesarean section 11/18/2014  . Fetal heart rate nonreactive   . [redacted] weeks gestation of pregnancy   . Non-reactive NST (non-stress test) 11/15/2014  . Migraine 07/12/2014   Past Medical History:  Diagnosis Date  . Arthritis   . Atypical chest pain 10/03/2020  . Common migraine with intractable migraine 01/23/2017  . Diabetes mellitus without complication (HCC)   . Fetal demise 05/19/2013  . GERD (gastroesophageal reflux disease)    with pregnancy  . Gestational diabetes   . Headache    migraines  . Hemorrhage after delivery of fetus 08/12/2006  . Morbidly obese (HCC) 01/23/2017  . Preeclampsia   . Pregnancy induced hypertension   . S/P cesarean section 05/20/2013  . S/P cesarean section 11/18/2014    Family History  Adopted: Yes  Problem Relation Age of Onset  . Diabetes Mother   . Asthma Other   . Hyperlipidemia Other   . Obesity Other   . Diabetes Maternal Grandmother   . Hypertension Maternal Grandmother   . Diabetes Paternal Grandmother   . Hypertension Paternal Grandmother     Past Surgical History:  Procedure Laterality Date  . CESAREAN SECTION    . CESAREAN SECTION N/A 05/19/2013   Procedure: CESAREAN SECTION;  Surgeon: Sherron Monday, MD;  Location: [facility];  Service: Obstetrics;  Laterality: N/A;  . CESAREAN SECTION WITH BILATERAL TUBAL  LIGATION Bilateral 11/17/2014   Procedure: CESAREAN SECTION WITH BILATERAL TUBAL LIGATION;  Surgeon: Sherian Rein, MD;  Location: [facility];  Service: Obstetrics;  Laterality: Bilateral;  . TOENAIL EXCISION    . WISDOM TOOTH EXTRACTION     Social History   Occupational History    Employer: [employer]  Tobacco Use  . Smoking status: Never Smoker  . Smokeless tobacco: Never Used  Vaping Use  . Vaping Use: Never used  Substance and Sexual Activity  . Alcohol use: No  . Drug use: No  . Sexual activity: Not Currently

## 2021-02-07 MED FILL — AJOVY 225 MG/1.5ML SOAJ: 225 | 30 days supply | Qty: 2 | Fill #1

## 2021-02-08 ENCOUNTER — Telehealth: Payer: Self-pay

## 2021-02-08 NOTE — Telephone Encounter (Signed)
Covered at 80% through her insurance Patient will be responsible for 20% OOP. $50.00 Co-pay  PA required for Orthovisc, right knee. Initiated PA with Eunice Blase at [employer], clinical notes need to be faxed to [phone]. Clinical notes have been faxed to [phone], attn: CM Coordinator

## 2021-02-11 MED FILL — DICYCLOMINE 10 MG CAPSULE: 10 | 22 days supply | Qty: 90 | Fill #1

## 2021-02-12 ENCOUNTER — Telehealth: Payer: Self-pay

## 2021-02-12 NOTE — Telephone Encounter (Signed)
PA has been approved for Orthovisc, right knee. Authorization# [id number] Valid 02/11/2021- 04/11/2021

## 2021-02-27 ENCOUNTER — Encounter: Payer: Self-pay | Admitting: Orthopaedic Surgery

## 2021-02-27 ENCOUNTER — Other Ambulatory Visit: Payer: Self-pay

## 2021-02-27 ENCOUNTER — Ambulatory Visit: Payer: No Typology Code available for payment source | Admitting: Orthopaedic Surgery

## 2021-02-27 VITALS — Ht 68.0 in | Wt 277.0 lb

## 2021-02-27 DIAGNOSIS — M1711 Unilateral primary osteoarthritis, right knee: Secondary | ICD-10-CM

## 2021-02-27 MED ORDER — HYALURONAN 30 MG/2ML IX SOSY
30.0000 mg | PREFILLED_SYRINGE | INTRA_ARTICULAR | Status: AC | PRN
  Administered 2021-02-27: 30 mg via INTRA_ARTICULAR

## 2021-02-27 NOTE — Progress Notes (Signed)
Office Visit Note   Patient: Audrey [name]           Date of Birth: 02-Mar-1986           MRN: [id number] Visit Date: 02/27/2021              Requested by: Arnette Felts, FNP [address] [place],  [place] [address] PCP: Arnette Felts, FNP   Assessment & Plan: Visit Diagnoses:  1. Unilateral primary osteoarthritis, right knee     Plan: First Orthovisc injection right knee.  Follow-up weekly for the next 2 weeks to complete the series  Follow-Up Instructions: Return in about 1 week (around 03/06/2021).   Orders:  Orders Placed This Encounter  Procedures  . Large Joint Inj: R knee   No orders of the defined types were placed in this encounter.     Procedures: Large Joint Inj: R knee on 02/27/2021 3:29 PM Indications: pain and joint swelling Details: 25 G 1.5 in needle  Arthrogram: No  Medications: 30 mg Hyaluronan 30 MG/2ML Outcome: tolerated well, no immediate complications Procedure, treatment alternatives, risks and benefits explained, specific risks discussed. Consent was given by the patient. Immediately prior to procedure a time out was called to verify the correct patient, procedure, equipment, support staff and site/side marked as required. Patient was prepped and draped in the usual sterile fashion.       Clinical Data: No additional findings.   Subjective: Chief Complaint  Patient presents with  . Right Knee - Follow-up    Orthovisc started 02/27/2021  Patient presents today for the first Orthovisc injection in her right knee.   HPI  Review of Systems   Objective: Vital Signs: Ht 5\' 8"  (1.727 m)   Wt 277 lb (125.6 kg)   BMI 42.12 kg/m   Physical Exam  Ortho Exam right knee with minimal medial joint pain.  No effusion.  Full extension of flexed over 90 degrees without instability  Specialty Comments:  No specialty comments available.  Imaging: No results found.   PMFS History: Patient Active Problem List   Diagnosis Date  Noted  . Atypical chest pain 10/03/2020  . Unilateral primary osteoarthritis, right knee 07/13/2020  . Dizziness 08/16/2019  . Cephalalgia 08/16/2019  . Class 3 severe obesity with body mass index (BMI) of 40.0 to 44.9 in adult [facility]) 01/27/2019  . Chronic pain of right knee 01/27/2019  . History of gestational diabetes 01/27/2019  . Breast lump on left side at 3 o'clock position 01/27/2019  . Cesarean delivery delivered 01/26/2019  . Gastroesophageal reflux disease 01/26/2019  . Morbidly obese (HCC) 01/23/2017  . Common migraine with intractable migraine 01/23/2017  . S/P cesarean section 11/18/2014  . Fetal heart rate nonreactive   . [redacted] weeks gestation of pregnancy   . Non-reactive NST (non-stress test) 11/15/2014  . Migraine 07/12/2014   Past Medical History:  Diagnosis Date  . Arthritis   . Atypical chest pain 10/03/2020  . Common migraine with intractable migraine 01/23/2017  . Diabetes mellitus without complication (HCC)   . Fetal demise 05/19/2013  . GERD (gastroesophageal reflux disease)    with pregnancy  . Gestational diabetes   . Headache    migraines  . Hemorrhage after delivery of fetus 08/12/2006  . Morbidly obese (HCC) 01/23/2017  . Preeclampsia   . Pregnancy induced hypertension   . S/P cesarean section 05/20/2013  . S/P cesarean section 11/18/2014    Family History  Adopted: Yes  Problem Relation Age  of Onset  . Diabetes Mother   . Asthma Other   . Hyperlipidemia Other   . Obesity Other   . Diabetes Maternal Grandmother   . Hypertension Maternal Grandmother   . Diabetes Paternal Grandmother   . Hypertension Paternal Grandmother     Past Surgical History:  Procedure Laterality Date  . CESAREAN SECTION    . CESAREAN SECTION N/A 05/19/2013   Procedure: CESAREAN SECTION;  Surgeon: Sherron Monday, MD;  Location: [facility];  Service: Obstetrics;  Laterality: N/A;  . CESAREAN SECTION WITH BILATERAL TUBAL LIGATION Bilateral 11/17/2014   Procedure: CESAREAN SECTION  WITH BILATERAL TUBAL LIGATION;  Surgeon: Sherian Rein, MD;  Location: [facility];  Service: Obstetrics;  Laterality: Bilateral;  . TOENAIL EXCISION    . WISDOM TOOTH EXTRACTION     Social History   Occupational History    Employer: [employer]  Tobacco Use  . Smoking status: Never Smoker  . Smokeless tobacco: Never Used  Vaping Use  . Vaping Use: Never used  Substance and Sexual Activity  . Alcohol use: No  . Drug use: No  . Sexual activity: Not Currently

## 2021-03-07 ENCOUNTER — Other Ambulatory Visit: Payer: Self-pay

## 2021-03-07 ENCOUNTER — Ambulatory Visit (INDEPENDENT_AMBULATORY_CARE_PROVIDER_SITE_OTHER): Payer: No Typology Code available for payment source | Admitting: Orthopaedic Surgery

## 2021-03-07 ENCOUNTER — Encounter: Payer: Self-pay | Admitting: Orthopaedic Surgery

## 2021-03-07 VITALS — Ht 68.0 in | Wt 277.0 lb

## 2021-03-07 DIAGNOSIS — M1711 Unilateral primary osteoarthritis, right knee: Secondary | ICD-10-CM

## 2021-03-07 MED ORDER — HYALURONAN 30 MG/2ML IX SOSY
30.0000 mg | PREFILLED_SYRINGE | INTRA_ARTICULAR | Status: AC | PRN
  Administered 2021-03-07: 30 mg via INTRA_ARTICULAR

## 2021-03-07 NOTE — Progress Notes (Signed)
Office Visit Note   Patient: Audrey [name]           Date of Birth: November 13, 1986           MRN: [id number] Visit Date: 03/07/2021              Requested by: Arnette Felts, FNP [address] [place],  [place] [address] PCP: Arnette Felts, FNP   Assessment & Plan: Visit Diagnoses:  1. Unilateral primary osteoarthritis, right knee     Plan: Second Orthovisc injection right knee.  Follow-up next week for the third and final injection. Follow-Up Instructions: Return in about 1 week (around 03/14/2021).   Orders:  Orders Placed This Encounter  Procedures  . Large Joint Inj: R knee   No orders of the defined types were placed in this encounter.     Procedures: Large Joint Inj: R knee on 03/07/2021 4:05 PM Indications: pain and joint swelling Details: 25 G 1.5 in needle  Arthrogram: No  Medications: 30 mg Hyaluronan 30 MG/2ML Outcome: tolerated well, no immediate complications Procedure, treatment alternatives, risks and benefits explained, specific risks discussed. Consent was given by the patient. Immediately prior to procedure a time out was called to verify the correct patient, procedure, equipment, support staff and site/side marked as required. Patient was prepped and draped in the usual sterile fashion.       Clinical Data: No additional findings.   Subjective: Chief Complaint  Patient presents with  . Right Knee - Follow-up    Orthovisc started 02/27/2021  Patient presents today for the second Orthovisc injection in her right knee. She started the series on 02/27/2021.   HPI  Review of Systems   Objective: Vital Signs: Ht 5\' 8"  (1.727 m)   Wt 277 lb (125.6 kg)   BMI 42.12 kg/m   Physical Exam  Ortho Exam right knee was not hot red warm or swollen.  No effusion.  Minimal medial joint pain.  Full extension flexed over 100 degrees without instability  Specialty Comments:  No specialty comments available.  Imaging: No results found.   PMFS  History: Patient Active Problem List   Diagnosis Date Noted  . Atypical chest pain 10/03/2020  . Unilateral primary osteoarthritis, right knee 07/13/2020  . Dizziness 08/16/2019  . Cephalalgia 08/16/2019  . Class 3 severe obesity with body mass index (BMI) of 40.0 to 44.9 in adult [facility]) 01/27/2019  . Chronic pain of right knee 01/27/2019  . History of gestational diabetes 01/27/2019  . Breast lump on left side at 3 o'clock position 01/27/2019  . Cesarean delivery delivered 01/26/2019  . Gastroesophageal reflux disease 01/26/2019  . Morbidly obese (HCC) 01/23/2017  . Common migraine with intractable migraine 01/23/2017  . S/P cesarean section 11/18/2014  . Fetal heart rate nonreactive   . [redacted] weeks gestation of pregnancy   . Non-reactive NST (non-stress test) 11/15/2014  . Migraine 07/12/2014   Past Medical History:  Diagnosis Date  . Arthritis   . Atypical chest pain 10/03/2020  . Common migraine with intractable migraine 01/23/2017  . Diabetes mellitus without complication (HCC)   . Fetal demise 05/19/2013  . GERD (gastroesophageal reflux disease)    with pregnancy  . Gestational diabetes   . Headache    migraines  . Hemorrhage after delivery of fetus 08/12/2006  . Morbidly obese (HCC) 01/23/2017  . Preeclampsia   . Pregnancy induced hypertension   . S/P cesarean section 05/20/2013  . S/P cesarean section 11/18/2014  Family History  Adopted: Yes  Problem Relation Age of Onset  . Diabetes Mother   . Asthma Other   . Hyperlipidemia Other   . Obesity Other   . Diabetes Maternal Grandmother   . Hypertension Maternal Grandmother   . Diabetes Paternal Grandmother   . Hypertension Paternal Grandmother     Past Surgical History:  Procedure Laterality Date  . CESAREAN SECTION    . CESAREAN SECTION N/A 05/19/2013   Procedure: CESAREAN SECTION;  Surgeon: Sherron Monday, MD;  Location: [facility];  Service: Obstetrics;  Laterality: N/A;  . CESAREAN SECTION WITH BILATERAL TUBAL  LIGATION Bilateral 11/17/2014   Procedure: CESAREAN SECTION WITH BILATERAL TUBAL LIGATION;  Surgeon: Sherian Rein, MD;  Location: [facility];  Service: Obstetrics;  Laterality: Bilateral;  . TOENAIL EXCISION    . WISDOM TOOTH EXTRACTION     Social History   Occupational History    Employer: [employer]  Tobacco Use  . Smoking status: Never Smoker  . Smokeless tobacco: Never Used  Vaping Use  . Vaping Use: Never used  Substance and Sexual Activity  . Alcohol use: No  . Drug use: No  . Sexual activity: Not Currently

## 2021-03-11 ENCOUNTER — Other Ambulatory Visit (HOSPITAL_COMMUNITY): Payer: Self-pay | Admitting: Medical

## 2021-03-13 ENCOUNTER — Other Ambulatory Visit: Payer: Self-pay

## 2021-03-13 ENCOUNTER — Ambulatory Visit (INDEPENDENT_AMBULATORY_CARE_PROVIDER_SITE_OTHER): Payer: No Typology Code available for payment source | Admitting: Orthopaedic Surgery

## 2021-03-13 ENCOUNTER — Encounter: Payer: Self-pay | Admitting: Orthopaedic Surgery

## 2021-03-13 VITALS — Ht 68.0 in | Wt 277.0 lb

## 2021-03-13 DIAGNOSIS — M1711 Unilateral primary osteoarthritis, right knee: Secondary | ICD-10-CM | POA: Diagnosis not present

## 2021-03-13 MED ORDER — HYALURONAN 30 MG/2ML IX SOSY
30.0000 mg | PREFILLED_SYRINGE | INTRA_ARTICULAR | Status: AC | PRN
  Administered 2021-03-13: 30 mg via INTRA_ARTICULAR

## 2021-03-13 NOTE — Progress Notes (Signed)
Office Visit Note   Patient: Audrey [name]           Date of Birth: 1986/09/20           MRN: [id number] Visit Date: 03/13/2021              Requested by: Arnette Felts, FNP [address] [place],  [place] [address] PCP: Arnette Felts, FNP   Assessment & Plan: Visit Diagnoses:  1. Unilateral primary osteoarthritis, right knee     Plan: Third and final Orthovisc injection right knee.  Return as needed  Follow-Up Instructions: Return if symptoms worsen or fail to improve.   Orders:  Orders Placed This Encounter  Procedures  . Large Joint Inj: R knee   No orders of the defined types were placed in this encounter.     Procedures: Large Joint Inj: R knee on 03/13/2021 3:06 PM Indications: pain and joint swelling Details: 25 G 1.5 in needle  Arthrogram: No  Medications: 30 mg Hyaluronan 30 MG/2ML Outcome: tolerated well, no immediate complications Procedure, treatment alternatives, risks and benefits explained, specific risks discussed. Consent was given by the patient. Immediately prior to procedure a time out was called to verify the correct patient, procedure, equipment, support staff and site/side marked as required. Patient was prepped and draped in the usual sterile fashion.       Clinical Data: No additional findings.   Subjective: Chief Complaint  Patient presents with  . Right Knee - Follow-up    Orthovisc started 02/27/2021  Patient presents today for the third and final Orthovisc injection in her right knee. She started the series on 02/27/2021. She is doing well and not having any pain.  Feels as though the injections have made a difference  HPI  Review of Systems   Objective: Vital Signs: Ht 5\' 8"  (1.727 m)   Wt 277 lb (125.6 kg)   BMI 42.12 kg/m   Physical Exam  Ortho Exam right knee was not hot warm or red.  No effusion.  Minimal medial joint pain.  Full extension of flexed over 100 degrees without instability  Specialty  Comments:  No specialty comments available.  Imaging: No results found.   PMFS History: Patient Active Problem List   Diagnosis Date Noted  . Atypical chest pain 10/03/2020  . Unilateral primary osteoarthritis, right knee 07/13/2020  . Dizziness 08/16/2019  . Cephalalgia 08/16/2019  . Class 3 severe obesity with body mass index (BMI) of 40.0 to 44.9 in adult [facility]) 01/27/2019  . Chronic pain of right knee 01/27/2019  . History of gestational diabetes 01/27/2019  . Breast lump on left side at 3 o'clock position 01/27/2019  . Cesarean delivery delivered 01/26/2019  . Gastroesophageal reflux disease 01/26/2019  . Morbidly obese (HCC) 01/23/2017  . Common migraine with intractable migraine 01/23/2017  . S/P cesarean section 11/18/2014  . Fetal heart rate nonreactive   . [redacted] weeks gestation of pregnancy   . Non-reactive NST (non-stress test) 11/15/2014  . Migraine 07/12/2014   Past Medical History:  Diagnosis Date  . Arthritis   . Atypical chest pain 10/03/2020  . Common migraine with intractable migraine 01/23/2017  . Diabetes mellitus without complication (HCC)   . Fetal demise 05/19/2013  . GERD (gastroesophageal reflux disease)    with pregnancy  . Gestational diabetes   . Headache    migraines  . Hemorrhage after delivery of fetus 08/12/2006  . Morbidly obese (HCC) 01/23/2017  . Preeclampsia   . Pregnancy  induced hypertension   . S/P cesarean section 05/20/2013  . S/P cesarean section 11/18/2014    Family History  Adopted: Yes  Problem Relation Age of Onset  . Diabetes Mother   . Asthma Other   . Hyperlipidemia Other   . Obesity Other   . Diabetes Maternal Grandmother   . Hypertension Maternal Grandmother   . Diabetes Paternal Grandmother   . Hypertension Paternal Grandmother     Past Surgical History:  Procedure Laterality Date  . CESAREAN SECTION    . CESAREAN SECTION N/A 05/19/2013   Procedure: CESAREAN SECTION;  Surgeon: Sherron Monday, MD;  Location: [facility];   Service: Obstetrics;  Laterality: N/A;  . CESAREAN SECTION WITH BILATERAL TUBAL LIGATION Bilateral 11/17/2014   Procedure: CESAREAN SECTION WITH BILATERAL TUBAL LIGATION;  Surgeon: Sherian Rein, MD;  Location: [facility];  Service: Obstetrics;  Laterality: Bilateral;  . TOENAIL EXCISION    . WISDOM TOOTH EXTRACTION     Social History   Occupational History    Employer: [employer]  Tobacco Use  . Smoking status: Never Smoker  . Smokeless tobacco: Never Used  Vaping Use  . Vaping Use: Never used  Substance and Sexual Activity  . Alcohol use: No  . Drug use: No  . Sexual activity: Not Currently

## 2021-03-15 ENCOUNTER — Encounter: Payer: Self-pay | Admitting: Neurology

## 2021-03-15 ENCOUNTER — Telehealth: Payer: Self-pay | Admitting: Neurology

## 2021-03-15 ENCOUNTER — Institutional Professional Consult (permissible substitution): Payer: No Typology Code available for payment source | Admitting: Neurology

## 2021-03-15 NOTE — Telephone Encounter (Signed)
This patient did not show for a new patient appointment today. 

## 2021-05-02 ENCOUNTER — Other Ambulatory Visit (HOSPITAL_COMMUNITY): Payer: Self-pay

## 2021-05-02 MED FILL — Fremanezumab-vfrm Subcutaneous Soln Auto-inj 225 MG/1.5ML: SUBCUTANEOUS | 30 days supply | Qty: 1.5 | Fill #0 | Status: AC

## 2021-05-02 MED FILL — Rimegepant Sulfate Tab Disint 75 MG: ORAL | 30 days supply | Qty: 16 | Fill #0 | Status: AC

## 2021-05-08 ENCOUNTER — Other Ambulatory Visit (HOSPITAL_COMMUNITY): Payer: Self-pay

## 2021-07-02 ENCOUNTER — Other Ambulatory Visit (HOSPITAL_COMMUNITY): Payer: Self-pay

## 2021-07-02 ENCOUNTER — Other Ambulatory Visit: Payer: Self-pay | Admitting: Nurse Practitioner

## 2021-07-02 DIAGNOSIS — G43009 Migraine without aura, not intractable, without status migrainosus: Secondary | ICD-10-CM

## 2021-07-02 MED FILL — Valacyclovir HCl Tab 1 GM: ORAL | 90 days supply | Qty: 90 | Fill #0 | Status: CN

## 2021-07-02 MED FILL — Valacyclovir HCl Tab 1 GM: ORAL | 30 days supply | Qty: 30 | Fill #0 | Status: AC

## 2021-07-03 ENCOUNTER — Other Ambulatory Visit: Payer: Self-pay | Admitting: Nurse Practitioner

## 2021-07-03 ENCOUNTER — Other Ambulatory Visit (HOSPITAL_COMMUNITY): Payer: Self-pay

## 2021-07-03 DIAGNOSIS — G43009 Migraine without aura, not intractable, without status migrainosus: Secondary | ICD-10-CM

## 2021-07-03 MED ORDER — AJOVY 225 MG/1.5ML ~~LOC~~ SOAJ
SUBCUTANEOUS | 3 refills | Status: DC
  Filled 2021-07-03: qty 1.5, 30d supply, fill #0
  Filled 2021-09-13: qty 1.5, 30d supply, fill #1
  Filled 2021-10-24 – 2021-11-06 (×2): qty 1.5, 30d supply, fill #2
  Filled 2022-01-29: qty 1.5, 30d supply, fill #3

## 2021-07-18 ENCOUNTER — Encounter: Payer: Self-pay | Admitting: Nurse Practitioner

## 2021-07-22 ENCOUNTER — Ambulatory Visit: Payer: Self-pay | Admitting: Nurse Practitioner

## 2021-07-24 ENCOUNTER — Ambulatory Visit (INDEPENDENT_AMBULATORY_CARE_PROVIDER_SITE_OTHER): Payer: No Typology Code available for payment source | Admitting: Nurse Practitioner

## 2021-07-24 ENCOUNTER — Encounter: Payer: Self-pay | Admitting: Nurse Practitioner

## 2021-07-24 ENCOUNTER — Other Ambulatory Visit (HOSPITAL_COMMUNITY): Payer: Self-pay

## 2021-07-24 ENCOUNTER — Other Ambulatory Visit: Payer: Self-pay

## 2021-07-24 VITALS — BP 118/72 | HR 75 | Temp 98.1°F | Ht 68.0 in | Wt 282.8 lb

## 2021-07-24 DIAGNOSIS — E8881 Metabolic syndrome: Secondary | ICD-10-CM | POA: Diagnosis not present

## 2021-07-24 DIAGNOSIS — K219 Gastro-esophageal reflux disease without esophagitis: Secondary | ICD-10-CM | POA: Diagnosis not present

## 2021-07-24 DIAGNOSIS — R030 Elevated blood-pressure reading, without diagnosis of hypertension: Secondary | ICD-10-CM | POA: Diagnosis not present

## 2021-07-24 DIAGNOSIS — Z8632 Personal history of gestational diabetes: Secondary | ICD-10-CM

## 2021-07-24 DIAGNOSIS — Z6841 Body Mass Index (BMI) 40.0 and over, adult: Secondary | ICD-10-CM

## 2021-07-24 LAB — POCT URINALYSIS DIPSTICK
Bilirubin, UA: NEGATIVE
Glucose, UA: NEGATIVE
Ketones, UA: NEGATIVE
Leukocytes, UA: NEGATIVE
Nitrite, UA: NEGATIVE
Protein, UA: POSITIVE — AB
Spec Grav, UA: >=1.03 — AB (ref 1.010–1.025)
Urobilinogen, UA: 0.2 E.U./dL
pH, UA: 6 (ref 5.0–8.0)

## 2021-07-24 MED ORDER — PANTOPRAZOLE SODIUM 20 MG PO TBEC
20.0000 mg | DELAYED_RELEASE_TABLET | Freq: Every day | ORAL | 1 refills | Status: DC
  Filled 2021-07-24: qty 90, 90d supply, fill #0

## 2021-07-24 MED ORDER — OZEMPIC (0.25 OR 0.5 MG/DOSE) 2 MG/1.5ML ~~LOC~~ SOPN
0.5000 mg | PEN_INJECTOR | SUBCUTANEOUS | 1 refills | Status: DC
  Filled 2021-07-24: qty 4.5, 84d supply, fill #0
  Filled 2021-08-15: qty 1.5, 28d supply, fill #0
  Filled 2021-09-13 – 2021-11-06 (×3): qty 4.5, 84d supply, fill #0

## 2021-07-24 MED ORDER — PANTOPRAZOLE SODIUM 20 MG PO TBEC
20.0000 mg | DELAYED_RELEASE_TABLET | Freq: Every day | ORAL | 0 refills | Status: DC
  Filled 2021-07-24: qty 30, 30d supply, fill #0

## 2021-07-24 NOTE — Progress Notes (Signed)
I,Briza Bark,acting as a Neurosurgeon for Arnette Felts, FNP.,have documented all relevant documentation on the behalf of Arnette Felts, FNP,as directed by  Arnette Felts, FNP while in the presence of Arnette Felts, FNP.  This visit occurred during the SARS-CoV-2 public health emergency.  Safety protocols were in place, including screening questions prior to the visit, additional usage of staff PPE, and extensive cleaning of exam room while observing appropriate contact time as indicated for disinfecting solutions.  Subjective:     Patient ID: Audrey [name] , female    DOB: 1986/03/08 , 35 y.o.   MRN: [id number]   Chief Complaint  Patient presents with   Medication Refill    HPI  Pt is here for med refill/ follow up on Pantoprazole. Symptoms began at the end of may early June. She has restarted this medication after having symptoms (chest discomfort, burning sensation, feels like need to belch). No changes in her eating habits. She does drink out of a straw. Denies nausea. Symptoms are worse in am and when she lays down at night. The last time she was seen she did have an elevated BP, so we are also checking her BP today.   Wt Readings from Last 3 Encounters: 07/24/21 : 282 lb 12.8 oz (128.3 kg) 03/13/21 : 277 lb (125.6 kg) 03/07/21 : 277 lb (125.6 kg)      Past Medical History:  Diagnosis Date   Arthritis    Atypical chest pain 10/03/2020   Common migraine with intractable migraine 01/23/2017   Diabetes mellitus without complication (HCC)    Fetal demise 05/19/2013   GERD (gastroesophageal reflux disease)    with pregnancy   Gestational diabetes    Headache    migraines   Hemorrhage after delivery of fetus 08/12/2006   Morbidly obese (HCC) 01/23/2017   Preeclampsia    Pregnancy induced hypertension    S/P cesarean section 05/20/2013   S/P cesarean section 11/18/2014     Family History  Adopted: Yes  Problem Relation Age of Onset   Diabetes Mother    Asthma Other    Hyperlipidemia  Other    Obesity Other    Diabetes Maternal Grandmother    Hypertension Maternal Grandmother    Diabetes Paternal Grandmother    Hypertension Paternal Grandmother      Current Outpatient Medications:    aspirin-acetaminophen-caffeine (EXCEDRIN MIGRAINE) 250-250-65 MG tablet, Take 2 tablets by mouth every 8 (eight) hours as needed for headache. , Disp: , Rfl:    clindamycin (CLEOCIN T) 1 % external solution, APPLY 1 APPLICATION ON WHOLE FACE ONCE DAILY IN THE MORNING, Disp: 60 mL, Rfl: 3   clindamycin (CLEOCIN T) 1 % external solution, APPLY TO AFFECTED AREA 2 TIMES DAILY, Disp: 60 mL, Rfl: 0   Clobetasol Propionate 0.05 % lotion, APPLY TO THE AFFECTED AREA(S) 2 TIMES DAILY, Disp: 118 mL, Rfl: 0   dicyclomine (BENTYL) 10 MG capsule, TAKE 1 CAPSULE (10 MG TOTAL) BY MOUTH 4 (FOUR) TIMES DAILY - BEFORE MEALS AND AT BEDTIME., Disp: 90 capsule, Rfl: 2   Fremanezumab-vfrm (AJOVY) 225 MG/1.5ML SOAJ, INJECT 1 PEN INTO THE SKIN EVERY 30 (THIRTY) DAYS., Disp: 1.5 mL, Rfl: 3   ibuprofen (ADVIL) 200 MG tablet, Take 200 mg by mouth every 6 (six) hours as needed., Disp: , Rfl:    ketoconazole (NIZORAL) 2 % shampoo, APPLY 5ML'S EXTERNALLY 2 TO 3 TIMES A WEEK.LET SIT FOR 5 MINUTES, Disp: 120 mL, Rfl: 5   Rimegepant Sulfate 75 MG TBDP, TAKE 1  TABLET BY MOUTH DAILY AS NEEDED., Disp: 30 tablet, Rfl: 2   Semaglutide,0.25 or 0.5MG /DOS, (OZEMPIC, 0.25 OR 0.5 MG/DOSE,) 2 MG/1.5ML SOPN, Inject 0.5 mg into the skin once a week., Disp: 4.5 mL, Rfl: 1   spironolactone (ALDACTONE) 50 MG tablet, TAKE 1 TABLET BY MOUTH ONCE A DAY AT NIGHT, Disp: 30 tablet, Rfl: 0   tretinoin (RETIN-A) 0.025 % cream, APPLY A PEA SIZED AMOUNT TO WHOLE FACE EVERY OTHER NIGHT FOR 2 WEEKS THEN EVERY NIGHT, Disp: 45 g, Rfl: 2   valACYclovir (VALTREX) 1000 MG tablet, TAKE AT ONSET OF SYMPTOMS OF COLD SORE 1 TABLET BY MOUTH DAILY X 3 DAYS, Disp: 90 tablet, Rfl: 1   Magnesium 250 MG TABS, Take 1 tablet (250 mg total) by mouth every evening. Take  with evening meal (Patient not taking: Reported on 07/24/2021), Disp: 30 tablet, Rfl: 0   metoprolol tartrate (LOPRESSOR) 100 MG tablet, TAKE 1 TABLET 2 HOURS PRIOR TO CT (Patient not taking: Reported on 07/24/2021), Disp: 1 tablet, Rfl: 0   pantoprazole (PROTONIX) 20 MG tablet, Take 1 tablet (20 mg total) by mouth daily., Disp: 90 tablet, Rfl: 1   triamcinolone (NASACORT) 55 MCG/ACT AERO nasal inhaler, Place 1 spray into the nose 2 (two) times daily., Disp: 1 Inhaler, Rfl: 2   Allergies  Allergen Reactions   Imitrex [Sumatriptan]     Nausea     Review of Systems  Constitutional: Negative.   Respiratory: Negative.    Cardiovascular: Negative.   Neurological: Negative.   Psychiatric/Behavioral: Negative.      Today's Vitals   07/24/21 1411  BP: 118/72  Pulse: 75  Temp: 98.1 F (36.7 C)  Weight: 282 lb 12.8 oz (128.3 kg)  Height: 5\' 8"  (1.727 m)  PainSc: 0-No pain   Body mass index is 43 kg/m.   Objective:  Physical Exam Vitals reviewed.  Constitutional:      General: She is not in acute distress.    Appearance: Normal appearance. She is well-developed. She is obese.  HENT:     Head: Normocephalic.  Cardiovascular:     Rate and Rhythm: Normal rate and regular rhythm.     Pulses: Normal pulses.     Heart sounds: Normal heart sounds. No murmur heard. Pulmonary:     Effort: Pulmonary effort is normal. No respiratory distress.     Breath sounds: Normal breath sounds. No wheezing.  Skin:    General: Skin is warm and dry.     Findings: No rash.  Neurological:     Mental Status: She is alert and oriented to person, place, and time.     Cranial Nerves: No cranial nerve deficit.     Motor: No weakness.  Psychiatric:        Mood and Affect: Mood normal. Mood is not anxious.        Speech: Speech normal.        Behavior: Behavior normal.        Thought Content: Thought content normal.        Judgment: Judgment normal.        Assessment And Plan:     1.  Gastroesophageal reflux disease without esophagitis Comments: Will sent Rx for pantoprazole Encouraged to avoid high gas producing foods  2. Elevated blood-pressure reading, without diagnosis of hypertension Comments: Resolved  3. Insulin resistance Comments: Unable to tolerate Rybelsus, will try Ozempic weekly samples given - Semaglutide,0.25 or 0.5MG /DOS, (OZEMPIC, 0.25 OR 0.5 MG/DOSE,) 2 MG/1.5ML SOPN; Inject 0.5 mg into  the skin once a week.  Dispense: 4.5 mL; Refill: 1  4. Class 3 severe obesity with body mass index (BMI) of 40.0 to 44.9 in adult, unspecified obesity type, unspecified whether serious comorbidity present (HCC) Chronic Discussed healthy diet and regular exercise options  Encouraged to exercise at least 150 minutes per week with 2 days of strength training as tolerated  5. History of gestational diabetes - Ambulatory referral to Ophthalmology - pantoprazole (PROTONIX) 20 MG tablet; Take 1 tablet (20 mg total) by mouth daily.  Dispense: 90 tablet; Refill: 1   Will give sample of Ozempic  Patient was given opportunity to ask questions. Patient verbalized understanding of the plan and was able to repeat key elements of the plan. All questions were answered to their satisfaction.  Arnette Felts, FNP   I, Arnette Felts, FNP, have reviewed all documentation for this visit. The documentation on 07/24/21 for the exam, diagnosis, procedures, and orders are all accurate and complete.   IF YOU HAVE BEEN REFERRED TO A SPECIALIST, IT MAY TAKE 1-2 WEEKS TO SCHEDULE/PROCESS THE REFERRAL. IF YOU HAVE NOT HEARD FROM US/SPECIALIST IN TWO WEEKS, PLEASE GIVE [place] A CALL AT [phone] X [phone].   THE PATIENT IS ENCOURAGED TO PRACTICE SOCIAL DISTANCING DUE TO THE COVID-19 PANDEMIC.

## 2021-07-24 NOTE — Patient Instructions (Signed)

## 2021-07-24 NOTE — Addendum Note (Signed)
Addended by: Randa Lynn T on: 07/24/2021 04:22 PM   Modules accepted: Orders

## 2021-07-30 ENCOUNTER — Other Ambulatory Visit (HOSPITAL_COMMUNITY): Payer: Self-pay

## 2021-07-31 ENCOUNTER — Encounter: Payer: Self-pay | Admitting: Nurse Practitioner

## 2021-08-15 ENCOUNTER — Other Ambulatory Visit (HOSPITAL_COMMUNITY): Payer: Self-pay

## 2021-08-16 ENCOUNTER — Other Ambulatory Visit (HOSPITAL_COMMUNITY): Payer: Self-pay

## 2021-08-26 ENCOUNTER — Other Ambulatory Visit (HOSPITAL_COMMUNITY): Payer: Self-pay

## 2021-08-26 ENCOUNTER — Encounter: Payer: No Typology Code available for payment source | Admitting: Nurse Practitioner

## 2021-08-29 ENCOUNTER — Other Ambulatory Visit: Payer: Self-pay

## 2021-09-10 ENCOUNTER — Encounter: Payer: Self-pay | Admitting: Nurse Practitioner

## 2021-09-13 ENCOUNTER — Other Ambulatory Visit (HOSPITAL_COMMUNITY): Payer: Self-pay

## 2021-09-25 ENCOUNTER — Other Ambulatory Visit: Payer: Self-pay

## 2021-09-25 ENCOUNTER — Ambulatory Visit (INDEPENDENT_AMBULATORY_CARE_PROVIDER_SITE_OTHER): Payer: No Typology Code available for payment source | Admitting: Nurse Practitioner

## 2021-09-25 ENCOUNTER — Encounter: Payer: Self-pay | Admitting: Nurse Practitioner

## 2021-09-25 VITALS — BP 114/82 | HR 70 | Temp 98.8°F | Ht 68.8 in | Wt 275.0 lb

## 2021-09-25 DIAGNOSIS — Z0000 Encounter for general adult medical examination without abnormal findings: Secondary | ICD-10-CM

## 2021-09-25 DIAGNOSIS — G43009 Migraine without aura, not intractable, without status migrainosus: Secondary | ICD-10-CM

## 2021-09-25 DIAGNOSIS — Z6841 Body Mass Index (BMI) 40.0 and over, adult: Secondary | ICD-10-CM

## 2021-09-25 DIAGNOSIS — E8881 Metabolic syndrome: Secondary | ICD-10-CM

## 2021-09-25 DIAGNOSIS — Z79899 Other long term (current) drug therapy: Secondary | ICD-10-CM | POA: Diagnosis not present

## 2021-09-25 LAB — CBC AND DIFFERENTIAL
Hemoglobin: 11 — AB (ref 12.0–16.0)
Platelets: 406 — AB (ref 150–399)
WBC: 11.3

## 2021-09-25 NOTE — Patient Instructions (Addendum)
Health Maintenance, Female Adopting a healthy lifestyle and getting preventive care are important in promoting health and wellness. Ask your health care provider about: The right schedule for you to have regular tests and exams. Things you can do on your own to prevent diseases and keep yourself healthy. What should I know about diet, weight, and exercise? Eat a healthy diet  Eat a diet that includes plenty of vegetables, fruits, low-fat dairy products, and lean protein. Do not eat a lot of foods that are high in solid fats, added sugars, or sodium. Maintain a healthy weight Body mass index (BMI) is used to identify weight problems. It estimates body fat based on height and weight. Your health care provider can help determine your BMI and help you achieve or maintain a healthy weight. Get regular exercise Get regular exercise. This is one of the most important things you can do for your health. Most adults should: Exercise for at least 150 minutes each week. The exercise should increase your heart rate and make you sweat (moderate-intensity exercise). Do strengthening exercises at least twice a week. This is in addition to the moderate-intensity exercise. Spend less time sitting. Even light physical activity can be beneficial. Watch cholesterol and blood lipids Have your blood tested for lipids and cholesterol at 36 years of age, then have this test every 5 years. Have your cholesterol levels checked more often if: Your lipid or cholesterol levels are high. You are older than 36 years of age. You are at high risk for heart disease. What should I know about cancer screening? Depending on your health history and family history, you may need to have cancer screening at various ages. This may include screening for: Breast cancer. Cervical cancer. Colorectal cancer. Skin cancer. Lung cancer. What should I know about heart disease, diabetes, and high blood pressure? Blood pressure and heart  disease High blood pressure causes heart disease and increases the risk of stroke. This is more likely to develop in people who have high blood pressure readings, are of African descent, or are overweight. Have your blood pressure checked: Every 3-5 years if you are 18-39 years of age. Every year if you are 40 years old or older. Diabetes Have regular diabetes screenings. This checks your fasting blood sugar level. Have the screening done: Once every three years after age 40 if you are at a normal weight and have a low risk for diabetes. More often and at a younger age if you are overweight or have a high risk for diabetes. What should I know about preventing infection? Hepatitis B If you have a higher risk for hepatitis B, you should be screened for this virus. Talk with your health care provider to find out if you are at risk for hepatitis B infection. Hepatitis C Testing is recommended for: Everyone born from 1945 through 1965. Anyone with known risk factors for hepatitis C. Sexually transmitted infections (STIs) Get screened for STIs, including gonorrhea and chlamydia, if: You are sexually active and are younger than 36 years of age. You are older than 36 years of age and your health care provider tells you that you are at risk for this type of infection. Your sexual activity has changed since you were last screened, and you are at increased risk for chlamydia or gonorrhea. Ask your health care provider if you are at risk. Ask your health care provider about whether you are at high risk for HIV. Your health care provider may recommend a prescription medicine   to help prevent HIV infection. If you choose to take medicine to prevent HIV, you should first get tested for HIV. You should then be tested every 3 months for as long as you are taking the medicine. Pregnancy If you are about to stop having your period (premenopausal) and you may become pregnant, seek counseling before you get  pregnant. Take 400 to 800 micrograms (mcg) of folic acid every day if you become pregnant. Ask for birth control (contraception) if you want to prevent pregnancy. Osteoporosis and menopause Osteoporosis is a disease in which the bones lose minerals and strength with aging. This can result in bone fractures. If you are 79 years old or older, or if you are at risk for osteoporosis and fractures, ask your health care provider if you should: Be screened for bone loss. Take a calcium or vitamin D supplement to lower your risk of fractures. Be given hormone replacement therapy (HRT) to treat symptoms of menopause. Follow these instructions at home: Lifestyle Do not use any products that contain nicotine or tobacco, such as cigarettes, e-cigarettes, and chewing tobacco. If you need help quitting, ask your health care provider. Do not use street drugs. Do not share needles. Ask your health care provider for help if you need support or information about quitting drugs. Alcohol use Do not drink alcohol if: Your health care provider tells you not to drink. You are pregnant, may be pregnant, or are planning to become pregnant. If you drink alcohol: Limit how much you use to 0-1 drink a day. Limit intake if you are breastfeeding. Be aware of how much alcohol is in your drink. In the [place] one drink equals one 12 oz bottle of beer (355 mL), one 5 oz glass of wine (148 mL), or one 1 oz glass of hard liquor (44 mL). General instructions Schedule regular health, dental, and eye exams. Stay current with your vaccines. Tell your health care provider if: You often feel depressed. You have ever been abused or do not feel safe at home. Summary Adopting a healthy lifestyle and getting preventive care are important in promoting health and wellness. Follow your health care provider's instructions about healthy diet, exercising, and getting tested or screened for diseases. Follow your health care provider's  instructions on monitoring your cholesterol and blood pressure. This information is not intended to replace advice given to you by your health care provider. Make sure you discuss any questions you have with your health care provider. Document Revised: 02/22/2021 Document Reviewed: 12/08/2018 Elsevier Patient Education  2022 Elsevier Inc.   You have an appt with Dr. Fabian Sharp on 12/25/2021 at 145pm

## 2021-09-25 NOTE — Progress Notes (Signed)
I,Katawbba Wiggins,acting as a Neurosurgeon for [employer], FNP.,have documented all relevant documentation on the behalf of Arnette Felts, FNP,as directed by  Arnette Felts, FNP while in the presence of Arnette Felts, FNP.   This visit occurred during the SARS-CoV-2 public health emergency.  Safety protocols were in place, including screening questions prior to the visit, additional usage of staff PPE, and extensive cleaning of exam room while observing appropriate contact time as indicated for disinfecting solutions.  Subjective:     Patient ID: Audrey [name] , female    DOB: 1986/12/15 , 35 y.o.   MRN: [id number]   Chief Complaint  Patient presents with   Annual Exam    HPI  Here for HM, the patient has her paps done at [facility], next appointment is Oct. 19th.   Wt Readings from Last 3 Encounters: 09/25/21 : 275 lb (124.7 kg) 07/24/21 : 282 lb 12.8 oz (128.3 kg) 03/13/21 : 277 lb (125.6 kg)  The last 1 week she has been walking at work.  She is now on 0.5 mg which she reports causes her GI upset.     Past Medical History:  Diagnosis Date   Arthritis    Atypical chest pain 10/03/2020   Common migraine with intractable migraine 01/23/2017   Diabetes mellitus without complication (HCC)    Fetal demise 05/19/2013   GERD (gastroesophageal reflux disease)    with pregnancy   Gestational diabetes    Headache    migraines   Hemorrhage after delivery of fetus 08/12/2006   Morbidly obese (HCC) 01/23/2017   Preeclampsia    Pregnancy induced hypertension    S/P cesarean section 05/20/2013   S/P cesarean section 11/18/2014     Family History  Adopted: Yes  Problem Relation Age of Onset   Diabetes Mother    Asthma Other    Hyperlipidemia Other    Obesity Other    Diabetes Maternal Grandmother    Hypertension Maternal Grandmother    Diabetes Paternal Grandmother    Hypertension Paternal Grandmother      Current Outpatient Medications:    aspirin-acetaminophen-caffeine  (EXCEDRIN MIGRAINE) 250-250-65 MG tablet, Take 2 tablets by mouth every 8 (eight) hours as needed for headache. , Disp: , Rfl:    clindamycin (CLEOCIN T) 1 % external solution, APPLY 1 APPLICATION ON WHOLE FACE ONCE DAILY IN THE MORNING, Disp: 60 mL, Rfl: 3   clindamycin (CLEOCIN T) 1 % external solution, APPLY TO AFFECTED AREA 2 TIMES DAILY, Disp: 60 mL, Rfl: 0   Clobetasol Propionate 0.05 % lotion, APPLY TO THE AFFECTED AREA(S) 2 TIMES DAILY, Disp: 118 mL, Rfl: 0   dicyclomine (BENTYL) 10 MG capsule, TAKE 1 CAPSULE (10 MG TOTAL) BY MOUTH 4 (FOUR) TIMES DAILY - BEFORE MEALS AND AT BEDTIME., Disp: 90 capsule, Rfl: 2   Fremanezumab-vfrm (AJOVY) 225 MG/1.5ML SOAJ, INJECT 1 PEN INTO THE SKIN EVERY 30 (THIRTY) DAYS., Disp: 1.5 mL, Rfl: 3   ibuprofen (ADVIL) 200 MG tablet, Take 200 mg by mouth every 6 (six) hours as needed., Disp: , Rfl:    ketoconazole (NIZORAL) 2 % shampoo, APPLY 5ML'S EXTERNALLY 2 TO 3 TIMES A WEEK.LET SIT FOR 5 MINUTES, Disp: 120 mL, Rfl: 5   pantoprazole (PROTONIX) 20 MG tablet, Take 1 tablet (20 mg total) by mouth daily., Disp: 90 tablet, Rfl: 1   Semaglutide,0.25 or 0.5MG /DOS, (OZEMPIC, 0.25 OR 0.5 MG/DOSE,) 2 MG/1.5ML SOPN, Inject 0.5 mg into the skin once a week., Disp: 4.5 mL, Rfl: 1  spironolactone (ALDACTONE) 50 MG tablet, TAKE 1 TABLET BY MOUTH ONCE A DAY AT NIGHT, Disp: 30 tablet, Rfl: 0   tretinoin (RETIN-A) 0.025 % cream, APPLY A PEA SIZED AMOUNT TO WHOLE FACE EVERY OTHER NIGHT FOR 2 WEEKS THEN EVERY NIGHT, Disp: 45 g, Rfl: 2   triamcinolone (NASACORT) 55 MCG/ACT AERO nasal inhaler, Place 1 spray into the nose 2 (two) times daily., Disp: 1 Inhaler, Rfl: 2   valACYclovir (VALTREX) 1000 MG tablet, TAKE AT ONSET OF SYMPTOMS OF COLD SORE 1 TABLET BY MOUTH DAILY X 3 DAYS, Disp: 90 tablet, Rfl: 1   Magnesium 250 MG TABS, Take 1 tablet (250 mg total) by mouth every evening. Take with evening meal (Patient not taking: No sig reported), Disp: 30 tablet, Rfl: 0   Allergies   Allergen Reactions   Imitrex [Sumatriptan]     Nausea      The patient states she uses tubal ligation for birth control. Patient's last menstrual period was 07/26/2021.. Negative for Dysmenorrhea and Negative for Menorrhagia. Negative for: breast discharge, breast lump(s), breast pain and breast self exam. Associated symptoms include abnormal vaginal bleeding. Pertinent negatives include abnormal bleeding (hematology), anxiety, decreased libido, depression, difficulty falling sleep, dyspareunia, history of infertility, nocturia, sexual dysfunction, sleep disturbances, urinary incontinence, urinary urgency, vaginal discharge and vaginal itching. Diet regular. The patient states her exercise level is none.   The patient's tobacco use is:  Social History   Tobacco Use  Smoking Status Never  Smokeless Tobacco Never   She has been exposed to passive smoke. The patient's alcohol use is:  Social History   Substance and Sexual Activity  Alcohol Use No   Additional information: Last pap 12/29/2017, next one scheduled for 12/29/2020-scheduled to have done in October 2022  Review of Systems  Constitutional: Negative.   HENT: Negative.    Eyes: Negative.   Respiratory: Negative.    Cardiovascular: Negative.   Gastrointestinal: Negative.   Endocrine: Negative.   Genitourinary: Negative.   Musculoskeletal: Negative.   Skin: Negative.   Allergic/Immunologic: Negative.   Neurological: Negative.   Hematological: Negative.   Psychiatric/Behavioral: Negative.      Today's Vitals   09/25/21 1408  BP: 114/82  Pulse: 70  Temp: 98.8 F (37.1 C)  TempSrc: Oral  Weight: 275 lb (124.7 kg)  Height: 5' 8.8" (1.748 m)   Body mass index is 40.85 kg/m.  Wt Readings from Last 3 Encounters:  09/25/21 275 lb (124.7 kg)  07/24/21 282 lb 12.8 oz (128.3 kg)  03/13/21 277 lb (125.6 kg)    BP Readings from Last 3 Encounters:  09/25/21 114/82  07/24/21 118/72  01/08/21 124/80    Objective:   Physical Exam Constitutional:      General: She is not in acute distress.    Appearance: Normal appearance. She is well-developed. She is obese.  HENT:     Head: Normocephalic and atraumatic.     Right Ear: Hearing, tympanic membrane, ear canal and external ear normal. There is no impacted cerumen.     Left Ear: Hearing, tympanic membrane, ear canal and external ear normal. There is no impacted cerumen.     Nose:     Comments: Deferred - masked    Mouth/Throat:     Comments: Deferred - masked Eyes:     General: Lids are normal.     Extraocular Movements: Extraocular movements intact.     Conjunctiva/sclera: Conjunctivae normal.     Pupils: Pupils are equal, round, and reactive to light.  Funduscopic exam:    Right eye: No papilledema.        Left eye: No papilledema.  Neck:     Thyroid: No thyroid mass.     Vascular: No carotid bruit.  Cardiovascular:     Rate and Rhythm: Normal rate and regular rhythm.     Pulses: Normal pulses.     Heart sounds: Normal heart sounds. No murmur heard. Pulmonary:     Effort: Pulmonary effort is normal. No respiratory distress.     Breath sounds: Normal breath sounds. No wheezing.  Abdominal:     General: Abdomen is flat. Bowel sounds are normal. There is no distension.     Palpations: Abdomen is soft.     Tenderness: There is no abdominal tenderness.  Genitourinary:    Comments: N/A Musculoskeletal:        General: No swelling or tenderness. Normal range of motion.     Cervical back: Full passive range of motion without pain, normal range of motion and neck supple.     Right lower leg: No edema.     Left lower leg: No edema.  Skin:    General: Skin is warm and dry.     Capillary Refill: Capillary refill takes less than 2 seconds.  Neurological:     General: No focal deficit present.     Mental Status: She is alert and oriented to person, place, and time.     Cranial Nerves: No cranial nerve deficit.     Sensory: No sensory deficit.      Motor: No weakness.  Psychiatric:        Mood and Affect: Mood normal.        Behavior: Behavior normal.        Thought Content: Thought content normal.        Judgment: Judgment normal.        Assessment And Plan:     1. Routine general medical examination at a health care facility Behavior modifications discussed and diet history reviewed.   Pt will continue to exercise regularly and modify diet with low GI, plant based foods and decrease intake of processed foods.  Recommend intake of daily multivitamin, Vitamin D, and calcium.  Recommend for preventive screenings, as well as recommend immunizations that include influenza, TDAP  2. Class 3 severe obesity with body mass index (BMI) of 40.0 to 44.9 in adult, unspecified obesity type, unspecified whether serious comorbidity present (HCC) Chronic Discussed healthy diet and regular exercise options  Encouraged to exercise at least 150 minutes per week with 2 days of strength training, encouraged can start with walking short distances. She is encouraged to strive for BMI less than 30 to decrease cardiac risk.   3. Insulin resistance She is to continue with Ozempic, if she needs to take the 0.25 mg dose for longer due to GI upset that is okay and encouraged to take a probiotic (Restora samples given) - Hemoglobin A1c - Insulin, random - Lipid panel - COMPLETE METABOLIC PANEL WITH GFR  4. Migraine without aura and without status migrainosus, not intractable Continue follow up with Neurology as needed  5. Other long term (current) drug therapy - CBC   Patient was given opportunity to ask questions. Patient verbalized understanding of the plan and was able to repeat key elements of the plan. All questions were answered to their satisfaction.   Arnette Felts, FNP   I, Arnette Felts, FNP, have reviewed all documentation for this visit. The documentation on 09/25/21  for the exam, diagnosis, procedures, and orders are all accurate  and complete.   THE PATIENT IS ENCOURAGED TO PRACTICE SOCIAL DISTANCING DUE TO THE COVID-19 PANDEMIC.

## 2021-09-26 LAB — CBC
HCT: 35.8 % (ref 35.0–45.0)
Hemoglobin: 11 g/dL — ABNORMAL LOW (ref 11.7–15.5)
MCH: 24.1 pg — ABNORMAL LOW (ref 27.0–33.0)
MCHC: 30.7 g/dL — ABNORMAL LOW (ref 32.0–36.0)
MCV: 78.3 fL — ABNORMAL LOW (ref 80.0–100.0)
MPV: 9.9 fL (ref 7.5–12.5)
Platelets: 406 10*3/uL — ABNORMAL HIGH (ref 140–400)
RBC: 4.57 10*6/uL (ref 3.80–5.10)
RDW: 15.7 % — ABNORMAL HIGH (ref 11.0–15.0)
WBC: 11.3 10*3/uL — ABNORMAL HIGH (ref 3.8–10.8)

## 2021-09-26 LAB — COMPLETE METABOLIC PANEL WITH GFR
AG Ratio: 1.2 (calc) (ref 1.0–2.5)
ALT: 15 U/L (ref 6–29)
AST: 18 U/L (ref 10–30)
Albumin: 4.1 g/dL (ref 3.6–5.1)
Alkaline phosphatase (APISO): 70 U/L (ref 31–125)
BUN: 12 mg/dL (ref 7–25)
CO2: 25 mmol/L (ref 20–32)
Calcium: 9.2 mg/dL (ref 8.6–10.2)
Chloride: 105 mmol/L (ref 98–110)
Creat: 0.71 mg/dL (ref 0.50–0.97)
Globulin: 3.4 g/dL (calc) (ref 1.9–3.7)
Glucose, Bld: 86 mg/dL (ref 65–99)
Potassium: 3.9 mmol/L (ref 3.5–5.3)
Sodium: 140 mmol/L (ref 135–146)
Total Bilirubin: 0.6 mg/dL (ref 0.2–1.2)
Total Protein: 7.5 g/dL (ref 6.1–8.1)
eGFR: 113 mL/min/{1.73_m2} (ref >=60)

## 2021-09-26 LAB — HEMOGLOBIN A1C
Hgb A1c MFr Bld: 4.8 % of total Hgb (ref <5.7)
Mean Plasma Glucose: 91 mg/dL
eAG (mmol/L): 5 mmol/L

## 2021-09-26 LAB — LIPID PANEL
Cholesterol: 156 mg/dL (ref <200)
HDL: 43 mg/dL — ABNORMAL LOW (ref >=50)
LDL Cholesterol (Calc): 94 mg/dL (calc)
Non-HDL Cholesterol (Calc): 113 mg/dL (calc) (ref <130)
Total CHOL/HDL Ratio: 3.6 (calc) (ref <5.0)
Triglycerides: 94 mg/dL (ref <150)

## 2021-09-26 LAB — INSULIN, RANDOM: Insulin: 51.3 u[IU]/mL — ABNORMAL HIGH

## 2021-10-18 ENCOUNTER — Encounter: Payer: Self-pay | Admitting: Nurse Practitioner

## 2021-10-24 ENCOUNTER — Other Ambulatory Visit (HOSPITAL_COMMUNITY): Payer: Self-pay

## 2021-11-01 ENCOUNTER — Other Ambulatory Visit (HOSPITAL_COMMUNITY): Payer: Self-pay

## 2021-11-05 ENCOUNTER — Encounter: Payer: Self-pay | Admitting: Nurse Practitioner

## 2021-11-06 ENCOUNTER — Other Ambulatory Visit: Payer: Self-pay | Admitting: Nurse Practitioner

## 2021-11-06 ENCOUNTER — Other Ambulatory Visit (HOSPITAL_COMMUNITY): Payer: Self-pay

## 2021-11-06 MED ORDER — DICYCLOMINE HCL 10 MG PO CAPS
10.0000 mg | ORAL_CAPSULE | Freq: Three times a day (TID) | ORAL | 2 refills | Status: DC
  Filled 2021-11-06: qty 90, 23d supply, fill #0
  Filled 2022-10-09: qty 90, 23d supply, fill #1
  Filled 2022-10-28: qty 90, 23d supply, fill #2

## 2021-11-08 ENCOUNTER — Other Ambulatory Visit (HOSPITAL_COMMUNITY): Payer: Self-pay

## 2021-11-08 MED ORDER — AMOXICILLIN-POT CLAVULANATE 500-125 MG PO TABS
1.0000 | ORAL_TABLET | Freq: Two times a day (BID) | ORAL | 0 refills | Status: DC
  Filled 2021-11-08: qty 14, 7d supply, fill #0

## 2021-12-05 ENCOUNTER — Ambulatory Visit: Payer: No Typology Code available for payment source | Admitting: Nurse Practitioner

## 2021-12-05 ENCOUNTER — Other Ambulatory Visit (HOSPITAL_COMMUNITY): Payer: Self-pay

## 2021-12-05 MED ORDER — AMOXICILLIN-POT CLAVULANATE 875-125 MG PO TABS
1.0000 | ORAL_TABLET | Freq: Two times a day (BID) | ORAL | 0 refills | Status: DC
  Filled 2021-12-05: qty 14, 7d supply, fill #0

## 2021-12-05 MED ORDER — HYDROCODONE-ACETAMINOPHEN 5-325 MG PO TABS
1.0000 | ORAL_TABLET | Freq: Four times a day (QID) | ORAL | 0 refills | Status: DC | PRN
  Filled 2021-12-05: qty 8, 2d supply, fill #0

## 2021-12-10 ENCOUNTER — Ambulatory Visit: Payer: No Typology Code available for payment source | Admitting: Nurse Practitioner

## 2021-12-25 ENCOUNTER — Encounter: Payer: Self-pay | Admitting: Nurse Practitioner

## 2021-12-25 LAB — HM DIABETES EYE EXAM

## 2022-01-21 ENCOUNTER — Encounter: Payer: Self-pay | Admitting: Nurse Practitioner

## 2022-01-29 ENCOUNTER — Other Ambulatory Visit (HOSPITAL_COMMUNITY): Payer: Self-pay

## 2022-04-07 ENCOUNTER — Ambulatory Visit (INDEPENDENT_AMBULATORY_CARE_PROVIDER_SITE_OTHER): Payer: No Typology Code available for payment source | Admitting: Nurse Practitioner

## 2022-04-07 ENCOUNTER — Other Ambulatory Visit (HOSPITAL_COMMUNITY): Payer: Self-pay

## 2022-04-07 ENCOUNTER — Other Ambulatory Visit: Payer: Self-pay | Admitting: Nurse Practitioner

## 2022-04-07 ENCOUNTER — Other Ambulatory Visit (HOSPITAL_COMMUNITY)
Admission: RE | Admit: 2022-04-07 | Discharge: 2022-04-07 | Disposition: A | Discharge status: Home / Self Care | Payer: No Typology Code available for payment source | Source: Ambulatory Visit | Attending: Nurse Practitioner | Admitting: Nurse Practitioner

## 2022-04-07 ENCOUNTER — Encounter: Payer: Self-pay | Admitting: Nurse Practitioner

## 2022-04-07 VITALS — BP 122/80 | HR 73 | Temp 98.7°F | Ht 68.8 in | Wt 261.0 lb

## 2022-04-07 DIAGNOSIS — G43009 Migraine without aura, not intractable, without status migrainosus: Secondary | ICD-10-CM

## 2022-04-07 DIAGNOSIS — N898 Other specified noninflammatory disorders of vagina: Secondary | ICD-10-CM

## 2022-04-07 DIAGNOSIS — E8881 Metabolic syndrome: Secondary | ICD-10-CM | POA: Diagnosis not present

## 2022-04-07 DIAGNOSIS — K6289 Other specified diseases of anus and rectum: Secondary | ICD-10-CM

## 2022-04-07 DIAGNOSIS — Z6838 Body mass index (BMI) 38.0-38.9, adult: Secondary | ICD-10-CM

## 2022-04-07 MED ORDER — FLUCONAZOLE 100 MG PO TABS
100.0000 mg | ORAL_TABLET | Freq: Every day | ORAL | 0 refills | Status: DC
  Filled 2022-04-07: qty 2, 5d supply, fill #0

## 2022-04-07 MED ORDER — NYSTATIN 100000 UNIT/GM EX POWD
1.0000 "application " | Freq: Three times a day (TID) | CUTANEOUS | 0 refills | Status: DC
  Filled 2022-04-07: qty 15, 5d supply, fill #0

## 2022-04-07 MED ORDER — WEGOVY 0.5 MG/0.5ML ~~LOC~~ SOAJ
0.5000 mg | SUBCUTANEOUS | 0 refills | Status: DC
  Filled 2022-04-07 – 2022-09-25 (×3): qty 2, 28d supply, fill #0

## 2022-04-07 NOTE — Progress Notes (Signed)
?Clinical biochemist as a Neurosurgeon for [employer], FNP.,have documented all relevant documentation on the behalf of Arnette Felts, FNP,as directed by  Arnette Felts, FNP while in the presence of Arnette Felts, FNP. ? ?This visit occurred during the SARS-CoV-2 public health emergency.  Safety protocols were in place, including screening questions prior to the visit, additional usage of staff PPE, and extensive cleaning of exam room while observing appropriate contact time as indicated for disinfecting solutions. ? ?Subjective:  ?  ? Patient ID: Audrey [name] , female    DOB: [birth year] , 36 y.o.   MRN: [id number] ? ? ?Chief Complaint  ?Patient presents with  ? Vaginal Pain  ? ? ?HPI ? ?Patient presents for treatment for vaginal irritation.  Began in the rectal area and now in her vaginal area. States "I feel like I am inflamed, feels like something is moving." She is not sexually active x 2 years. She has used an antifungal cream.  She had her cycle and then a few days after it began.  ? ?Wt Readings from Last 3 Encounters: ?04/07/22 : 261 lb (118.4 kg) ?09/25/21 : 275 lb (124.7 kg) ?07/24/21 : 282 lb 12.8 oz (128.3 kg) ? ? ? ?Vaginal Pain ?The patient's primary symptoms include genital itching and a genital rash. The patient's pertinent negatives include no vaginal discharge. This is a new problem. The current episode started in the past 7 days. She is not pregnant. Pertinent negatives include no abdominal pain or headaches. She is not sexually active. She uses tubal ligation for contraception. Her menstrual history has been regular. There is no history of an abdominal surgery.   ? ?Past Medical History:  ?Diagnosis Date  ? Arthritis   ? Atypical chest pain 10/03/2020  ? Common migraine with intractable migraine 01/23/2017  ? Diabetes mellitus without complication (HCC)   ? Fetal demise 05/19/2013  ? GERD (gastroesophageal reflux disease)   ? with pregnancy  ? Gestational diabetes   ? Headache   ? migraines  ?  Hemorrhage after delivery of fetus 08/12/2006  ? Morbidly obese (HCC) 01/23/2017  ? Preeclampsia   ? Pregnancy induced hypertension   ? S/P cesarean section 05/20/2013  ? S/P cesarean section 11/18/2014  ?  ? ?Family History  ?Adopted: Yes  ?Problem Relation Age of Onset  ? Diabetes Mother   ? Asthma Other   ? Hyperlipidemia Other   ? Obesity Other   ? Diabetes Maternal Grandmother   ? Hypertension Maternal Grandmother   ? Diabetes Paternal Grandmother   ? Hypertension Paternal Grandmother   ? ? ? ?Current Outpatient Medications:  ?  aspirin-acetaminophen-caffeine (EXCEDRIN MIGRAINE) 250-250-65 MG tablet, Take 2 tablets by mouth every 8 (eight) hours as needed for headache. , Disp: , Rfl:  ?  dicyclomine (BENTYL) 10 MG capsule, Take 1 capsule (10 mg total) by mouth 4 (four) times daily - before meals and at bedtime., Disp: 90 capsule, Rfl: 2 ?  fluconazole (DIFLUCAN) 100 MG tablet, Take 1 tablet by mouth now and repeat dose in 5 days, Disp: 2 tablet, Rfl: 0 ?  nystatin powder, Apply 1 application topically 3 (three) times daily., Disp: 15 g, Rfl: 0 ?  pantoprazole (PROTONIX) 20 MG tablet, Take 1 tablet (20 mg total) by mouth daily., Disp: 90 tablet, Rfl: 1 ?  Semaglutide-Weight Management (WEGOVY) 0.5 MG/0.5ML SOAJ, Inject 0.5 mg into the skin once a week., Disp: 2 mL, Rfl: 0 ?  Fremanezumab-vfrm (AJOVY) 225 MG/1.5ML SOAJ, Inject 225 mg into the  skin every 30 (thirty) days., Disp: 1.5 mL, Rfl: 3 ?  HYDROcodone-acetaminophen (NORCO/VICODIN) 5-325 MG tablet, Take 1 tablet by mouth every 6 (six) hours as needed for pain for up to 8 doses, Disp: 8 tablet, Rfl: 0 ?  ibuprofen (ADVIL) 200 MG tablet, Take 200 mg by mouth every 6 (six) hours as needed., Disp: , Rfl:  ?  Magnesium 250 MG TABS, Take 1 tablet (250 mg total) by mouth every evening. Take with evening meal (Patient not taking: No sig reported), Disp: 30 tablet, Rfl: 0 ?  metroNIDAZOLE (FLAGYL) 500 MG tablet, Take 1 tablet (500 mg total) by mouth 3 (three) times  daily for 10 days, Disp: 30 tablet, Rfl: 0 ?  triamcinolone (NASACORT) 55 MCG/ACT AERO nasal inhaler, Place 1 spray into the nose 2 (two) times daily., Disp: 1 Inhaler, Rfl: 2  ? ?Allergies  ?Allergen Reactions  ? Imitrex [Sumatriptan]   ?  Nausea  ?  ? ?Review of Systems  ?Constitutional: Negative.   ?Respiratory: Negative.    ?Cardiovascular: Negative.  Negative for chest pain, palpitations and leg swelling.  ?Gastrointestinal:  Negative for abdominal pain.  ?Genitourinary:  Positive for vaginal pain. Negative for vaginal discharge.  ?Neurological:  Negative for dizziness and headaches.  ?Psychiatric/Behavioral: Negative.     ? ?Today's Vitals  ? 04/07/22 1208  ?BP: 122/80  ?Pulse: 73  ?Temp: 98.7 ?F (37.1 ?C)  ?TempSrc: Oral  ?Weight: 261 lb (118.4 kg)  ?Height: 5' 8.8" (1.748 m)  ? ?Body mass index is 38.77 kg/m?.  ? ?Objective:  ?Physical Exam ?Vitals reviewed.  ?Constitutional:   ?   General: She is not in acute distress. ?   Appearance: She is well-developed. She is obese.  ?HENT:  ?   Head: Normocephalic.  ?Cardiovascular:  ?   Rate and Rhythm: Normal rate and regular rhythm.  ?Pulmonary:  ?   Effort: Pulmonary effort is normal. No respiratory distress.  ?   Breath sounds: Normal breath sounds. No wheezing.  ?Skin: ?   General: Skin is warm and dry.  ?   Findings: No rash (no obvious rash, moisture noted to rectal area.).  ?Neurological:  ?   Mental Status: She is alert and oriented to person, place, and time.  ?   Cranial Nerves: No cranial nerve deficit.  ?   Motor: No weakness.  ?Psychiatric:     ?   Mood and Affect: Mood normal. Mood is not anxious.     ?   Speech: Speech normal.     ?   Behavior: Behavior normal.     ?   Thought Content: Thought content normal.     ?   Judgment: Judgment normal.  ?  ? ?   ?Assessment And Plan:  ?   ?1. Vaginal irritation ?Comments: Will check for yeast infection, has not been sexually active in 2 years will not check for additional STDs ?- fluconazole (DIFLUCAN) 100 MG  tablet; Take 1 tablet by mouth now and repeat dose in 5 days  Dispense: 2 tablet; Refill: 0 ?- Cervicovaginal ancillary only ? ?2. Insulin resistance ?Comments: If we can get her Reginal LutesWegovy approved this will help these levels. Encouraged to limit intake of sugary foods. ?- Microalbumin / Creatinine Urine Ratio ?- Insulin, random ? ?3. BMI 38.0-38.9,adult ?- Semaglutide-Weight Management (WEGOVY) 0.5 MG/0.5ML SOAJ; Inject 0.5 mg into the skin once a week.  Dispense: 2 mL; Refill: 0 ? ?4. Rectal irritation ?Comments: She has rash noted to rectal area  will treat for yeast.  ?- nystatin powder; Apply 1 application topically 3 (three) times daily.  Dispense: 15 g; Refill: 0 ?  ? ? ?Patient was given opportunity to ask questions. Patient verbalized understanding of the plan and was able to repeat key elements of the plan. All questions were answered to their satisfaction.  ?Arnette Felts, FNP  ? ?I, Arnette Felts, FNP, have reviewed all documentation for this visit. The documentation on 04/22/22 for the exam, diagnosis, procedures, and orders are all accurate and complete.  ? ?IF YOU HAVE BEEN REFERRED TO A SPECIALIST, IT MAY TAKE 1-2 WEEKS TO SCHEDULE/PROCESS THE REFERRAL. IF YOU HAVE NOT HEARD FROM US/SPECIALIST IN TWO WEEKS, PLEASE GIVE [place] A CALL AT [phone].  ? ?THE PATIENT IS ENCOURAGED TO PRACTICE SOCIAL DISTANCING DUE TO THE COVID-19 PANDEMIC.   ?

## 2022-04-08 ENCOUNTER — Other Ambulatory Visit (HOSPITAL_COMMUNITY): Payer: Self-pay

## 2022-04-08 LAB — INSULIN, RANDOM: INSULIN: 44.5 u[IU]/mL — ABNORMAL HIGH (ref 2.6–24.9)

## 2022-04-09 ENCOUNTER — Other Ambulatory Visit (HOSPITAL_COMMUNITY): Payer: Self-pay

## 2022-04-09 ENCOUNTER — Ambulatory Visit: Payer: No Typology Code available for payment source | Admitting: Nurse Practitioner

## 2022-04-09 LAB — CERVICOVAGINAL ANCILLARY ONLY
Bacterial Vaginitis (gardnerella): POSITIVE — AB
Candida Glabrata: NEGATIVE
Candida Vaginitis: NEGATIVE
Comment: NEGATIVE
Comment: NEGATIVE
Comment: NEGATIVE

## 2022-04-10 ENCOUNTER — Other Ambulatory Visit: Payer: Self-pay | Admitting: Nurse Practitioner

## 2022-04-10 ENCOUNTER — Other Ambulatory Visit (HOSPITAL_COMMUNITY): Payer: Self-pay

## 2022-04-10 DIAGNOSIS — B9689 Other specified bacterial agents as the cause of diseases classified elsewhere: Secondary | ICD-10-CM

## 2022-04-10 MED ORDER — METRONIDAZOLE 500 MG PO TABS
500.0000 mg | ORAL_TABLET | Freq: Three times a day (TID) | ORAL | 0 refills | Status: DC
  Filled 2022-04-10: qty 30, 10d supply, fill #0

## 2022-04-10 MED ORDER — AJOVY 225 MG/1.5ML ~~LOC~~ SOAJ
225.0000 mg | SUBCUTANEOUS | 3 refills | Status: DC
  Filled 2022-04-10: qty 1.5, 30d supply, fill #0
  Filled 2022-05-09 – 2022-05-19 (×2): qty 1.5, 30d supply, fill #1
  Filled 2022-06-23: qty 1.5, 30d supply, fill #2
  Filled 2022-10-09: qty 1.5, 30d supply, fill #3

## 2022-04-28 ENCOUNTER — Encounter: Payer: Self-pay | Admitting: Nurse Practitioner

## 2022-05-08 ENCOUNTER — Other Ambulatory Visit: Payer: Self-pay | Admitting: Nurse Practitioner

## 2022-05-08 ENCOUNTER — Other Ambulatory Visit (HOSPITAL_COMMUNITY): Payer: Self-pay

## 2022-05-08 DIAGNOSIS — K6289 Other specified diseases of anus and rectum: Secondary | ICD-10-CM

## 2022-05-08 MED ORDER — NYSTATIN 100000 UNIT/GM EX POWD
1.0000 "application " | Freq: Three times a day (TID) | CUTANEOUS | 0 refills | Status: DC
  Filled 2022-05-08: qty 15, 5d supply, fill #0

## 2022-05-08 MED ORDER — ALBENDAZOLE 200 MG PO TABS
ORAL_TABLET | ORAL | 0 refills | Status: DC
  Filled 2022-05-08: qty 4, 14d supply, fill #0

## 2022-05-08 MED ORDER — ALBENDAZOLE 200 MG PO TABS
ORAL_TABLET | ORAL | 1 refills | Status: DC
  Filled 2022-05-08: qty 4, fill #0

## 2022-05-09 ENCOUNTER — Other Ambulatory Visit (HOSPITAL_COMMUNITY): Payer: Self-pay

## 2022-05-19 ENCOUNTER — Other Ambulatory Visit (HOSPITAL_COMMUNITY): Payer: Self-pay

## 2022-05-29 ENCOUNTER — Other Ambulatory Visit (HOSPITAL_COMMUNITY): Payer: Self-pay

## 2022-06-23 ENCOUNTER — Encounter: Payer: Self-pay | Admitting: Physician Assistant

## 2022-06-23 ENCOUNTER — Ambulatory Visit (INDEPENDENT_AMBULATORY_CARE_PROVIDER_SITE_OTHER): Payer: No Typology Code available for payment source | Admitting: Physician Assistant

## 2022-06-23 ENCOUNTER — Other Ambulatory Visit (HOSPITAL_COMMUNITY): Payer: Self-pay

## 2022-06-23 VITALS — BP 124/72 | HR 77 | Ht 68.0 in | Wt 271.0 lb

## 2022-06-23 DIAGNOSIS — L290 Pruritus ani: Secondary | ICD-10-CM | POA: Diagnosis not present

## 2022-06-23 DIAGNOSIS — K5900 Constipation, unspecified: Secondary | ICD-10-CM | POA: Diagnosis not present

## 2022-06-23 NOTE — Progress Notes (Signed)
Chief Complaint: Abdominal pain  HPI:    Audrey [name] is a 36 year old female, known to Dr. Orvan Falconer, with a past medical history as listed below, who presents to clinic today with a complaint of abdominal pain.    10/11/2020 patient seen in clinic by me and discussed that she had always had a bowel movement after eating her whole life but was typically solid and over the past 6 to 8 months to change to loose stool.  Also discussed some atypical chest pain.  At that time ordered a GI pathogen panel.  She was continued on Pantoprazole 20 mg daily as this helps with her atypical chest pain.  Also started on Dicyclomine 10 mg 3 times daily for suspected IBS.  Discussed that if symptoms do not get any better then may need an EGD and colonoscopy.    05/08/2022 patient treated for rectal irritation with Albendazole 200 mg 2 tablets once and repeat in 2 weeks.  Also given Nystatin powder.    Today, the patient tells me that about a month and a half ago to two, she started with some rectal itching and was seen by her PCP who took a look at her rectum and "did not really see much", apparently she was given some Nystatin powder which calmed things down for a while but about 2 weeks later it seemed to flare again so she bought some over-the-counter pinworm treatment as she had seen some little tiny white things in her stool and has children and thought maybe this was a possibility.  Two weeks later she still had symptoms and was prescribed Albendazole.  Now she has continued with rectal itching and tells me that she has noticed some mucus in her stool but also seen which she thinks are maybe these pinworms.  Her bowel habits have changed to more constipation with small balls and never feeling completely empty.  Tells me that previously when she was seen the Dicyclomine seem to treat all of her symptoms and now she only uses that as needed and has not needed it in quite a while.    Denies fever, chills, weight loss,  blood in her stool or symptoms that awaken her from sleep.  Past Medical History:  Diagnosis Date   Arthritis    Atypical chest pain 10/03/2020   Common migraine with intractable migraine 01/23/2017   Diabetes mellitus without complication (HCC)    Fetal demise 05/19/2013   GERD (gastroesophageal reflux disease)    with pregnancy   Gestational diabetes    Headache    migraines   Hemorrhage after delivery of fetus 08/12/2006   Morbidly obese (HCC) 01/23/2017   Preeclampsia    Pregnancy induced hypertension    S/P cesarean section 05/20/2013   S/P cesarean section 11/18/2014    Past Surgical History:  Procedure Laterality Date   CESAREAN SECTION     CESAREAN SECTION N/A 05/19/2013   Procedure: CESAREAN SECTION;  Surgeon: Sherron Monday, MD;  Location: [facility];  Service: Obstetrics;  Laterality: N/A;   CESAREAN SECTION WITH BILATERAL TUBAL LIGATION Bilateral 11/17/2014   Procedure: CESAREAN SECTION WITH BILATERAL TUBAL LIGATION;  Surgeon: Sherian Rein, MD;  Location: [facility];  Service: Obstetrics;  Laterality: Bilateral;   TOENAIL EXCISION     WISDOM TOOTH EXTRACTION      Current Outpatient Medications  Medication Sig Dispense Refill   albendazole (ALBENZA) 200 MG tablet Take 2 tablets once then repeat in 2 weeks 4 tablet 0   aspirin-acetaminophen-caffeine (EXCEDRIN  MIGRAINE) 250-250-65 MG tablet Take 2 tablets by mouth every 8 (eight) hours as needed for headache.      dicyclomine (BENTYL) 10 MG capsule Take 1 capsule (10 mg total) by mouth 4 (four) times daily - before meals and at bedtime. 90 capsule 2   fluconazole (DIFLUCAN) 100 MG tablet Take 1 tablet by mouth now and repeat dose in 5 days 2 tablet 0   Fremanezumab-vfrm (AJOVY) 225 MG/1.5ML SOAJ Inject 225 mg into the skin every 30 (thirty) days. 1.5 mL 3   HYDROcodone-acetaminophen (NORCO/VICODIN) 5-325 MG tablet Take 1 tablet by mouth every 6 (six) hours as needed for pain for up to 8 doses 8 tablet 0   ibuprofen (ADVIL)  200 MG tablet Take 200 mg by mouth every 6 (six) hours as needed.     Magnesium 250 MG TABS Take 1 tablet (250 mg total) by mouth every evening. Take with evening meal (Patient not taking: No sig reported) 30 tablet 0   metroNIDAZOLE (FLAGYL) 500 MG tablet Take 1 tablet (500 mg total) by mouth 3 (three) times daily for 10 days 30 tablet 0   nystatin powder Apply 1 application topically 3 (three) times daily. 15 g 0   pantoprazole (PROTONIX) 20 MG tablet Take 1 tablet (20 mg total) by mouth daily. 90 tablet 1   Semaglutide-Weight Management (WEGOVY) 0.5 MG/0.5ML SOAJ Inject 0.5 mg into the skin once a week. 2 mL 0   triamcinolone (NASACORT) 55 MCG/ACT AERO nasal inhaler Place 1 spray into the nose 2 (two) times daily. 1 Inhaler 2   No current facility-administered medications for this visit.    Allergies as of 06/23/2022 - Review Complete 04/07/2022  Allergen Reaction Noted   Imitrex [sumatriptan]  01/23/2017    Family History  Adopted: Yes  Problem Relation Age of Onset   Diabetes Mother    Asthma Other    Hyperlipidemia Other    Obesity Other    Diabetes Maternal Grandmother    Hypertension Maternal Grandmother    Diabetes Paternal Grandmother    Hypertension Paternal Grandmother     Social History   Socioeconomic History   Marital status: Single    Spouse name: Not on file   Number of children: 1   Years of education: 12+   Highest education level: Not on file  Occupational History    Employer: [employer]  Tobacco Use   Smoking status: Never   Smokeless tobacco: Never  Vaping Use   Vaping Use: Never used  Substance and Sexual Activity   Alcohol use: No   Drug use: No   Sexual activity: Not Currently  Other Topics Concern   Not on file  Social History Narrative   Patient lives at home with daughter.    Patient has has 1 child and one on the way.    Patient has some college education.    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          Social Determinants of Manufacturing engineer Strain: Not on file  Food Insecurity: Not on file  Transportation Needs: Not on file  Physical Activity: Not on file  Stress: Not on file  Social Connections: Not on file  Intimate Partner Violence: Not on file    Review of Systems:    Constitutional: No weight loss, fever or chills Cardiovascular: No chest pain  Respiratory: No SOB  Gastrointestinal: See HPI and otherwise negative   Physical Exam:  Vital signs: BP 124/72  Pulse 77   Ht 5\' 8"  (1.727 m)   Wt 271 lb (122.9 kg)   SpO2 98%   BMI 41.21 kg/m    Constitutional:   Pleasant overweight AA female appears to be in NAD, Well developed, Well nourished, alert and cooperative Respiratory: Respirations even and unlabored. Lungs clear to auscultation bilaterally.   No wheezes, crackles, or rhonchi.  Cardiovascular: Normal S1, S2. No MRG. Regular rate and rhythm. No peripheral edema, cyanosis or pallor.  Gastrointestinal:  Soft, nondistended, nontender. No rebound or guarding. Normal bowel sounds. No appreciable masses or hepatomegaly. Rectal:  DECLINED  Psychiatric: Oriented to person, place and time. Demonstrates good judgement and reason without abnormal affect or behaviors.  No recent labs or imaging.  Assessment: 1.  Rectal itching: For the past month to month and a half, 2 treatments for pinworms and also Nystatin powder all of which helped a little bit but symptoms continue; likely pruritus and I/moisture in the area 2.  Constipation: Over the past month or so, small balls of stool and incomplete evacuation; likely related to IBS and possibly Ozempic/Wegovy  Plan: 1.  Discussed with patient that likely she does not have pinworms given that she has had treatment x2 for this.  More than likely she is having rectal itching from irritation/moisture which is far more common, but we will order an O&P just to make sure that she does not have parasites. 2.  For now would recommend that she use a barrier  ointment such as A&D 3-4 times daily to the rectal area 3.  Also would recommend she start MiraLAX once daily in the morning to help with constipation.  Hopefully as this gets better she will have no further rectal irritation. 4.  Scheduled patient for a follow-up with me in 2 months to ensure that she is doing better.  Certainly if she is not having any more symptoms she can call and cancel.  Hyacinth Meeker, PA-C [facility] 06/23/2022, 3:35 PM  Cc: Arnette Felts, FNP

## 2022-06-25 ENCOUNTER — Other Ambulatory Visit (HOSPITAL_COMMUNITY): Payer: Self-pay

## 2022-07-04 NOTE — Progress Notes (Signed)
Reviewed.  Eladio Dentremont L. Aldean Suddeth, MD, MPH  

## 2022-07-07 ENCOUNTER — Other Ambulatory Visit: Payer: No Typology Code available for payment source

## 2022-07-07 DIAGNOSIS — K5900 Constipation, unspecified: Secondary | ICD-10-CM

## 2022-07-07 DIAGNOSIS — L290 Pruritus ani: Secondary | ICD-10-CM

## 2022-07-10 LAB — OVA AND PARASITE EXAMINATION
CONCENTRATE RESULT:: NONE SEEN
MICRO NUMBER:: 13624839
SPECIMEN QUALITY:: ADEQUATE
TRICHROME RESULT:: NONE SEEN

## 2022-08-05 ENCOUNTER — Encounter: Payer: Self-pay | Admitting: Nurse Practitioner

## 2022-08-06 ENCOUNTER — Other Ambulatory Visit (HOSPITAL_COMMUNITY): Payer: Self-pay

## 2022-08-06 ENCOUNTER — Ambulatory Visit (INDEPENDENT_AMBULATORY_CARE_PROVIDER_SITE_OTHER): Payer: No Typology Code available for payment source | Admitting: Nurse Practitioner

## 2022-08-06 ENCOUNTER — Encounter: Payer: Self-pay | Admitting: Nurse Practitioner

## 2022-08-06 VITALS — BP 130/70 | HR 68 | Temp 98.4°F | Ht 68.0 in | Wt 274.0 lb

## 2022-08-06 DIAGNOSIS — G43009 Migraine without aura, not intractable, without status migrainosus: Secondary | ICD-10-CM

## 2022-08-06 DIAGNOSIS — Z8632 Personal history of gestational diabetes: Secondary | ICD-10-CM

## 2022-08-06 DIAGNOSIS — R42 Dizziness and giddiness: Secondary | ICD-10-CM | POA: Diagnosis not present

## 2022-08-06 DIAGNOSIS — R110 Nausea: Secondary | ICD-10-CM

## 2022-08-06 DIAGNOSIS — Z6841 Body Mass Index (BMI) 40.0 and over, adult: Secondary | ICD-10-CM

## 2022-08-06 MED ORDER — MECLIZINE HCL 12.5 MG PO TABS
12.5000 mg | ORAL_TABLET | Freq: Three times a day (TID) | ORAL | 0 refills | Status: DC | PRN
  Filled 2022-08-06 – 2022-08-15 (×2): qty 30, 10d supply, fill #0

## 2022-08-06 MED ORDER — TRIAMCINOLONE ACETONIDE 40 MG/ML IJ SUSP
40.0000 mg | Freq: Once | INTRAMUSCULAR | Status: AC
  Administered 2022-08-06: 40 mg via INTRAMUSCULAR

## 2022-08-06 MED ORDER — ONDANSETRON HCL 4 MG PO TABS
4.0000 mg | ORAL_TABLET | Freq: Every day | ORAL | 1 refills | Status: DC | PRN
  Filled 2022-08-06: qty 30, 30d supply, fill #0

## 2022-08-06 MED ORDER — KETOROLAC TROMETHAMINE 60 MG/2ML IM SOLN
60.0000 mg | Freq: Once | INTRAMUSCULAR | Status: AC
  Administered 2022-08-06: 60 mg via INTRAMUSCULAR

## 2022-08-06 NOTE — Patient Instructions (Addendum)
Dizziness Dizziness is a common problem. It makes you feel unsteady or light-headed. You may feel like you are about to pass out (faint). Dizziness can lead to getting hurt if you stumble or fall. Dizziness can be caused by many things, including: Medicines. Not having enough water in your body (dehydration). Illness. Follow these instructions at home: Eating and drinking  Drink enough fluid to keep your pee (urine) pale yellow. This helps to keep you from getting dehydrated. Try to drink more clear fluids, such as water. Do not drink alcohol. Limit how much caffeine you drink or eat, if your doctor tells you to do that. Limit how much salt (sodium) you drink or eat, if your doctor tells you to do that. Activity  Avoid making quick movements. Stand up slowly from sitting in a chair, and steady yourself until you feel okay. In the morning, first sit up on the side of the bed. When you feel okay, stand up slowly while you hold onto something. Do this until you know that your balance is okay. If you need to stand in one place for a long time, move your legs often. Tighten and relax the muscles in your legs while you are standing. Do not drive or use machinery if you feel dizzy. Avoid bending down if you feel dizzy. Place items in your home so you can reach them easily without leaning over. Lifestyle Do not smoke or use any products that contain nicotine or tobacco. If you need help quitting, ask your doctor. Try to lower your stress level. You can do this by using methods such as yoga or meditation. Talk with your doctor if you need help. General instructions Watch your dizziness for any changes. Take over-the-counter and prescription medicines only as told by your doctor. Talk with your doctor if you think that you are dizzy because of a medicine that you are taking. Tell a friend or a family member that you are feeling dizzy. If he or she notices any changes in your behavior, have this  person call your doctor. Keep all follow-up visits. Contact a doctor if: Your dizziness does not go away. Your dizziness or light-headedness gets worse. You feel like you may vomit (are nauseous). You have trouble hearing. You have new symptoms. You are unsteady on your feet. You feel like the room is spinning. You have neck pain or a stiff neck. You have a fever. Get help right away if: You vomit or have watery poop (diarrhea), and you cannot eat or drink anything. You have trouble: Talking. Walking. Swallowing. Using your arms, hands, or legs. You feel generally weak. You are not thinking clearly, or you have trouble forming sentences. A friend or family member may notice this. You have: Chest pain. Pain in your belly (abdomen). Shortness of breath. Sweating. Your vision changes. You are bleeding. You have a very bad headache. These symptoms may be an emergency. Get help right away. Call your local emergency services (911 in the [place] Do not wait to see if the symptoms will go away. Do not drive yourself to the hospital. Summary Dizziness makes you feel unsteady or light-headed. You may feel like you are about to pass out (faint). Drink enough fluid to keep your pee (urine) pale yellow. Do not drink alcohol. Avoid making quick movements if you feel dizzy. Watch your dizziness for any changes. This information is not intended to replace advice given to you by your health care provider. Make sure you discuss any questions   you have with your health care provider. Document Revised: 11/19/2020 Document Reviewed: 11/19/2020 Elsevier Patient Education  2023 Else[employer] Inc.  Migraine Headache A migraine headache is an intense, throbbing pain on one side or both sides of the head. Migraine headaches may also cause other symptoms, such as nausea, vomiting, and sensitivity to light and noise. A migraine headache can last from 4 hours to 3 days. Talk with your doctor about what things  may bring on (trigger) your migraine headaches. What are the causes? The exact cause of this condition is not known. However, a migraine may be caused when nerves in the brain become irritated and release chemicals that cause inflammation of blood vessels. This inflammation causes pain. This condition may be triggered or caused by: Drinking alcohol. Smoking. Taking medicines, such as: Medicine used to treat chest pain (nitroglycerin). Birth control pills. Estrogen. Certain blood pressure medicines. Eating or drinking products that contain nitrates, glutamate, aspartame, or tyramine. Aged cheeses, chocolate, or caffeine may also be triggers. Doing physical activity. Other things that may trigger a migraine headache include: Menstruation. Pregnancy. Hunger. Stress. Lack of sleep or too much sleep. Weather changes. Fatigue. What increases the risk? The following factors may make you more likely to experience migraine headaches: Being a certain age. This condition is more common in people who are 66-30 years old. Being female. Having a family history of migraine headaches. Being Caucasian. Having a mental health condition, such as depression or anxiety. Being obese. What are the signs or symptoms? The main symptom of this condition is pulsating or throbbing pain. This pain may: Happen in any area of the head, such as on one side or both sides. Interfere with daily activities. Get worse with physical activity. Get worse with exposure to bright lights or loud noises. Other symptoms may include: Nausea. Vomiting. Dizziness. General sensitivity to bright lights, loud noises, or smells. Before you get a migraine headache, you may get warning signs (an aura). An aura may include: Seeing flashing lights or having blind spots. Seeing bright spots, halos, or zigzag lines. Having tunnel vision or blurred vision. Having numbness or a tingling feeling. Having trouble talking. Having  muscle weakness. Some people have symptoms after a migraine headache (postdromal phase), such as: Feeling tired. Difficulty concentrating. How is this diagnosed? A migraine headache can be diagnosed based on: Your symptoms. A physical exam. Tests, such as: CT scan or an MRI of the head. These imaging tests can help rule out other causes of headaches. Taking fluid from the spine (lumbar puncture) and analyzing it (cerebrospinal fluid analysis, or CSF analysis). How is this treated? This condition may be treated with medicines that: Relieve pain. Relieve nausea. Prevent migraine headaches. Treatment for this condition may also include: Acupuncture. Lifestyle changes like avoiding foods that trigger migraine headaches. Biofeedback. Cognitive behavioral therapy. Follow these instructions at home: Medicines Take over-the-counter and prescription medicines only as told by your health care provider. Ask your health care provider if the medicine prescribed to you: Requires you to avoid driving or using heavy machinery. Can cause constipation. You may need to take these actions to prevent or treat constipation: Drink enough fluid to keep your urine pale yellow. Take over-the-counter or prescription medicines. Eat foods that are high in fiber, such as beans, whole grains, and fresh fruits and vegetables. Limit foods that are high in fat and processed sugars, such as fried or sweet foods. Lifestyle Do not drink alcohol. Do not use any products that contain nicotine or tobacco,  such as cigarettes, e-cigarettes, and chewing tobacco. If you need help quitting, ask your health care provider. Get at least 8 hours of sleep every night. Find ways to manage stress, such as meditation, deep breathing, or yoga. General instructions     Keep a journal to find out what may trigger your migraine headaches. For example, write down: What you eat and drink. How much sleep you get. Any change to your  diet or medicines. If you have a migraine headache: Avoid things that make your symptoms worse, such as bright lights. It may help to lie down in a dark, quiet room. Do not drive or use heavy machinery. Ask your health care provider what activities are safe for you while you are experiencing symptoms. Keep all follow-up visits as told by your health care provider. This is important. Contact a health care provider if: You develop symptoms that are different or more severe than your usual migraine headache symptoms. You have more than 15 headache days in one month. Get help right away if: Your migraine headache becomes severe. Your migraine headache lasts longer than 72 hours. You have a fever. You have a stiff neck. You have vision loss. Your muscles feel weak or like you cannot control them. You start to lose your balance often. You have trouble walking. You faint. You have a seizure. Summary A migraine headache is an intense, throbbing pain on one side or both sides of the head. Migraines may also cause other symptoms, such as nausea, vomiting, and sensitivity to light and noise. This condition may be treated with medicines and lifestyle changes. You may also need to avoid certain things that trigger a migraine headache. Keep a journal to find out what may trigger your migraine headaches. Contact your health care provider if you have more than 15 headache days in a month or you develop symptoms that are different or more severe than your usual migraine headache symptoms. This information is not intended to replace advice given to you by your health care provider. Make sure you discuss any questions you have with your health care provider. Document Revised: 04/08/2019 Document Reviewed: 01/27/2019 Elsevier Patient Education  2023 Elsevier Inc.  [employer] light computer screen protector, blue light blocking glasses

## 2022-08-06 NOTE — Progress Notes (Signed)
I,Tianna Badgett,acting as a Neurosurgeon for [employer], FNP.,have documented all relevant documentation on the behalf of Arnette Felts, FNP,as directed by  Arnette Felts, FNP while in the presence of Arnette Felts, FNP.  Subjective:     Patient ID: Audrey [name] , female    DOB: 1986-09-05 , 36 y.o.   MRN: [id number]   Chief Complaint  Patient presents with   Dizziness    HPI  Patient presents today for dizziness.  She has been taking excedrin mostly for her headaches, she is now having dizziness that has been persistent causing the room to spin and nausuea.   Dizziness This is a new problem. The current episode started 1 to 4 weeks ago (2 weeks ago). The problem occurs intermittently. The problem has been gradually worsening. Associated symptoms include headaches, nausea and vertigo. Pertinent negatives include no abdominal pain. The symptoms are aggravated by walking and twisting (worse since at work - she now has dual monitors at work and is bigger). She has tried NSAIDs for the symptoms.  Headache  This is a chronic problem. Pain location: rotating across entire head. Associated symptoms include dizziness, nausea and photophobia. Pertinent negatives include no abdominal pain. Nothing aggravates the symptoms. She has tried NSAIDs for the symptoms. Her past medical history is significant for obesity. There is no history of hypertension.     Past Medical History:  Diagnosis Date   Arthritis    Atypical chest pain 10/03/2020   Common migraine with intractable migraine 01/23/2017   Diabetes mellitus without complication (HCC)    Fetal demise 05/19/2013   GERD (gastroesophageal reflux disease)    with pregnancy   Gestational diabetes    Headache    migraines   Hemorrhage after delivery of fetus 08/12/2006   Morbidly obese (HCC) 01/23/2017   Preeclampsia    Pregnancy induced hypertension    S/P cesarean section 05/20/2013   S/P cesarean section 11/18/2014     Family History  Adopted:  Yes  Problem Relation Age of Onset   Diabetes Mother    Asthma Other    Hyperlipidemia Other    Obesity Other    Diabetes Maternal Grandmother    Hypertension Maternal Grandmother    Diabetes Paternal Grandmother    Hypertension Paternal Grandmother      Current Outpatient Medications:    aspirin-acetaminophen-caffeine (EXCEDRIN MIGRAINE) 250-250-65 MG tablet, Take 2 tablets by mouth every 8 (eight) hours as needed for headache. , Disp: , Rfl:    dicyclomine (BENTYL) 10 MG capsule, Take 1 capsule (10 mg total) by mouth 4 (four) times daily - before meals and at bedtime., Disp: 90 capsule, Rfl: 2   Fremanezumab-vfrm (AJOVY) 225 MG/1.5ML SOAJ, Inject 225 mg into the skin every 30 (thirty) days., Disp: 1.5 mL, Rfl: 3   ibuprofen (ADVIL) 200 MG tablet, Take 200 mg by mouth every 6 (six) hours as needed., Disp: , Rfl:    Magnesium 250 MG TABS, Take 1 tablet (250 mg total) by mouth every evening. Take with evening meal, Disp: 30 tablet, Rfl: 0   meclizine (ANTIVERT) 12.5 MG tablet, Take 1 tablet (12.5 mg total) by mouth 3 (three) times daily as needed for dizziness., Disp: 30 tablet, Rfl: 0   nystatin powder, Apply 1 application topically 3 (three) times daily., Disp: 15 g, Rfl: 0   ondansetron (ZOFRAN) 4 MG tablet, Take 1 tablet (4 mg total) by mouth daily as needed for nausea or vomiting., Disp: 30 tablet, Rfl: 1   pantoprazole (PROTONIX)  20 MG tablet, Take 1 tablet (20 mg total) by mouth daily., Disp: 90 tablet, Rfl: 1   Semaglutide-Weight Management (WEGOVY) 0.5 MG/0.5ML SOAJ, Inject 0.5 mg into the skin once a week., Disp: 2 mL, Rfl: 0   triamcinolone (NASACORT) 55 MCG/ACT AERO nasal inhaler, Place 1 spray into the nose 2 (two) times daily., Disp: 1 Inhaler, Rfl: 2   Allergies  Allergen Reactions   Imitrex [Sumatriptan]     Nausea     Review of Systems  Constitutional: Negative.   Eyes:  Positive for photophobia.  Respiratory: Negative.    Cardiovascular: Negative.    Gastrointestinal:  Positive for nausea. Negative for abdominal pain.  Neurological:  Positive for dizziness, vertigo and headaches.     Today's Vitals   08/06/22 1039  BP: 130/70  Pulse: 68  Temp: 98.4 F (36.9 C)  TempSrc: Oral  Weight: 274 lb (124.3 kg)  Height: 5\' 8"  (1.727 m)   Body mass index is 41.66 kg/m.  Wt Readings from Last 3 Encounters:  08/06/22 274 lb (124.3 kg)  06/23/22 271 lb (122.9 kg)  04/07/22 261 lb (118.4 kg)    Objective:  Physical Exam Vitals reviewed.  Constitutional:      General: She is not in acute distress.    Appearance: She is well-developed. She is obese.  HENT:     Head: Normocephalic.  Cardiovascular:     Rate and Rhythm: Normal rate and regular rhythm.  Pulmonary:     Effort: Pulmonary effort is normal. No respiratory distress.     Breath sounds: Normal breath sounds. No wheezing.  Skin:    General: Skin is warm and dry.     Capillary Refill: Capillary refill takes less than 2 seconds.  Neurological:     General: No focal deficit present.     Mental Status: She is alert and oriented to person, place, and time.     Cranial Nerves: No cranial nerve deficit.     Motor: No weakness.     Coordination: Romberg sign positive (slight postive). Coordination normal.  Psychiatric:        Mood and Affect: Mood normal. Mood is not anxious.        Speech: Speech normal.        Behavior: Behavior normal.        Thought Content: Thought content normal.        Judgment: Judgment normal.         Assessment And Plan:     1. Dizziness Comments: Her dizziness may be related to her persistent migraine, will provide meclizine. Orthostats are normal. Encouraged to stay well hydrated with water - meclizine (ANTIVERT) 12.5 MG tablet; Take 1 tablet (12.5 mg total) by mouth 3 (three) times daily as needed for dizziness.  Dispense: 30 tablet; Refill: 0  2. Migraine without aura and without status migrainosus, not intractable Comments: Will treat  with toradol and kenalog to abort her headache. Encouraged to limit intake of Excedrin migraine and ibuprofen due to risk of rebound headaches. Referred to headache clinic she did not go to the previous Neurology referral made.  - ondansetron (ZOFRAN) 4 MG tablet; Take 1 tablet (4 mg total) by mouth daily as needed for nausea or vomiting.  Dispense: 30 tablet; Refill: 1 - ketorolac (TORADOL) injection 60 mg - triamcinolone acetonide (KENALOG-40) injection 40 mg - AMB referral to headache clinic  3. Nausea - ondansetron (ZOFRAN) 4 MG tablet; Take 1 tablet (4 mg total) by mouth daily as  needed for nausea or vomiting.  Dispense: 30 tablet; Refill: 1  4. Class 3 severe obesity with body mass index (BMI) of 40.0 to 44.9 in adult, unspecified obesity type, unspecified whether serious comorbidity present [facility], [place]) Comments: Encouraged to continue to focus on weight loss and healthy eating, cutting back on any processed foods, sandwich meats. She is encouraged to strive for BMI less than 30 to decrease cardiac risk. Advised to aim for at least 150 minutes of exercise per week.  5. History of gestational diabetes Comments: HgbA1c is currently normal.  - POCT Urinalysis Dipstick (81002) - Microalbumin / Creatinine Urine Ratio - Hemoglobin A1c    Patient was given opportunity to ask questions. Patient verbalized understanding of the plan and was able to repeat key elements of the plan. All questions were answered to their satisfaction.  Arnette Felts, FNP   I, Arnette Felts, FNP, have reviewed all documentation for this visit. The documentation on 08/06/22 for the exam, diagnosis, procedures, and orders are all accurate and complete.   IF YOU HAVE BEEN REFERRED TO A SPECIALIST, IT MAY TAKE 1-2 WEEKS TO SCHEDULE/PROCESS THE REFERRAL. IF YOU HAVE NOT HEARD FROM US/SPECIALIST IN TWO WEEKS, PLEASE GIVE [place] A CALL AT [phone] X [phone].   THE PATIENT IS ENCOURAGED TO PRACTICE SOCIAL DISTANCING DUE TO THE  COVID-19 PANDEMIC.

## 2022-08-12 ENCOUNTER — Encounter: Payer: Self-pay | Admitting: Physical Medicine and Rehabilitation

## 2022-08-15 ENCOUNTER — Other Ambulatory Visit (HOSPITAL_COMMUNITY): Payer: Self-pay

## 2022-08-18 ENCOUNTER — Other Ambulatory Visit (HOSPITAL_COMMUNITY): Payer: Self-pay

## 2022-08-29 ENCOUNTER — Encounter: Payer: Self-pay | Admitting: Nurse Practitioner

## 2022-09-25 ENCOUNTER — Other Ambulatory Visit (HOSPITAL_COMMUNITY): Payer: Self-pay

## 2022-09-26 ENCOUNTER — Encounter: Payer: Self-pay | Admitting: Nurse Practitioner

## 2022-10-01 ENCOUNTER — Ambulatory Visit (INDEPENDENT_AMBULATORY_CARE_PROVIDER_SITE_OTHER): Payer: No Typology Code available for payment source | Admitting: Nurse Practitioner

## 2022-10-01 ENCOUNTER — Encounter: Payer: Self-pay | Admitting: Nurse Practitioner

## 2022-10-01 ENCOUNTER — Other Ambulatory Visit (HOSPITAL_COMMUNITY): Payer: Self-pay

## 2022-10-01 VITALS — BP 130/88 | HR 72 | Temp 98.0°F | Ht 68.0 in | Wt 275.6 lb

## 2022-10-01 DIAGNOSIS — E88819 Insulin resistance, unspecified: Secondary | ICD-10-CM

## 2022-10-01 DIAGNOSIS — Z0000 Encounter for general adult medical examination without abnormal findings: Secondary | ICD-10-CM | POA: Diagnosis not present

## 2022-10-01 DIAGNOSIS — Z6838 Body mass index (BMI) 38.0-38.9, adult: Secondary | ICD-10-CM

## 2022-10-01 DIAGNOSIS — R5383 Other fatigue: Secondary | ICD-10-CM

## 2022-10-01 DIAGNOSIS — Z6841 Body Mass Index (BMI) 40.0 and over, adult: Secondary | ICD-10-CM

## 2022-10-01 DIAGNOSIS — Z8632 Personal history of gestational diabetes: Secondary | ICD-10-CM

## 2022-10-01 DIAGNOSIS — R21 Rash and other nonspecific skin eruption: Secondary | ICD-10-CM | POA: Diagnosis not present

## 2022-10-01 LAB — POCT URINALYSIS DIPSTICK
Bilirubin, UA: NEGATIVE
Glucose, UA: NEGATIVE
Ketones, UA: NEGATIVE
Leukocytes, UA: NEGATIVE
Nitrite, UA: NEGATIVE
Protein, UA: NEGATIVE
Spec Grav, UA: 1.025 (ref 1.010–1.025)
Urobilinogen, UA: 0.2 E.U./dL
pH, UA: 6.5 (ref 5.0–8.0)

## 2022-10-01 MED ORDER — VALACYCLOVIR HCL 500 MG PO TABS
500.0000 mg | ORAL_TABLET | Freq: Two times a day (BID) | ORAL | 0 refills | Status: AC
  Filled 2022-10-01: qty 10, 5d supply, fill #0

## 2022-10-01 MED ORDER — WEGOVY 0.5 MG/0.5ML ~~LOC~~ SOAJ
0.5000 mg | SUBCUTANEOUS | 0 refills | Status: DC
  Filled 2022-10-01: qty 2, 28d supply, fill #0

## 2022-10-01 NOTE — Patient Instructions (Signed)

## 2022-10-01 NOTE — Progress Notes (Signed)
Barnet Glasgow Martin,acting as a Education administrator for Minette Brine, FNP.,have documented all relevant documentation on the behalf of Minette Brine, FNP,as directed by  Minette Brine, FNP while in the presence of Minette Brine, [place].   Subjective:     Patient ID: Audrey [name] , female    DOB: 07-Aug-1986 , 36 y.o.   MRN: [id number]   Chief Complaint  Patient presents with   Annual Exam    HPI  Patient presents today for HM, Patient states compliance with medication and doesn't have any other issues.   Wt Readings from Last 3 Encounters: 10/01/22 : 275 lb 9.6 oz (125 kg) 08/06/22 : 274 lb (124.3 kg) 06/23/22 : 271 lb (122.9 kg)  She has seen GI continues on same regimen. She has an appt on 11/13 at 920 with Headache specialist.     Past Medical History:  Diagnosis Date   Arthritis    Atypical chest pain 10/03/2020   Common migraine with intractable migraine 01/23/2017   Diabetes mellitus without complication ([place])    Fetal demise 05/19/2013   GERD (gastroesophageal reflux disease)    with pregnancy   Gestational diabetes    Headache    migraines   Hemorrhage after delivery of fetus 08/12/2006   Morbidly obese ([place]) 01/23/2017   Preeclampsia    Pregnancy induced hypertension    S/P cesarean section 05/20/2013   S/P cesarean section 11/18/2014     Family History  Adopted: Yes  Problem Relation Age of Onset   Diabetes Mother    Asthma Other    Hyperlipidemia Other    Obesity Other    Diabetes Maternal Grandmother    Hypertension Maternal Grandmother    Diabetes Paternal Grandmother    Hypertension Paternal Grandmother      Current Outpatient Medications:    ondansetron (ZOFRAN) 4 MG tablet, Take 1 tablet (4 mg total) by mouth daily as needed for nausea or vomiting., Disp: 30 tablet, Rfl: 1   aspirin-acetaminophen-caffeine (EXCEDRIN MIGRAINE) 250-250-65 MG tablet, Take 2 tablets by mouth every 8 (eight) hours as needed for headache. , Disp: , Rfl:    dicyclomine (BENTYL) 10 MG  capsule, Take 1 capsule (10 mg total) by mouth 4 (four) times daily - before meals and at bedtime., Disp: 90 capsule, Rfl: 2   ferrous sulfate 325 (65 FE) MG EC tablet, Take 1 tablet (325 mg total) by mouth 2 (two) times daily., Disp: 60 tablet, Rfl: 3   Fremanezumab-vfrm (AJOVY) 225 MG/1.5ML SOAJ, Inject 225 mg into the skin every 30 (thirty) days., Disp: 1.5 mL, Rfl: 3   ibuprofen (ADVIL) 200 MG tablet, Take 200 mg by mouth every 6 (six) hours as needed., Disp: , Rfl:    Magnesium 250 MG TABS, Take 1 tablet (250 mg total) by mouth every evening. Take with evening meal, Disp: 30 tablet, Rfl: 0   meclizine (ANTIVERT) 12.5 MG tablet, Take 1 tablet (12.5 mg total) by mouth 3 (three) times daily as needed for dizziness., Disp: 30 tablet, Rfl: 0   nystatin powder, Apply 1 application topically 3 (three) times daily. (Patient not taking: Reported on 10/01/2022), Disp: 15 g, Rfl: 0   pantoprazole (PROTONIX) 20 MG tablet, Take 1 tablet (20 mg total) by mouth daily., Disp: 90 tablet, Rfl: 1   Semaglutide-Weight Management (WEGOVY) 0.5 MG/0.5ML SOAJ, Inject 0.5 mg into the skin once a week., Disp: 2 mL, Rfl: 0   triamcinolone (NASACORT) 55 MCG/ACT AERO nasal inhaler, Place 1 spray into the nose 2 (two)  times daily., Disp: 1 Inhaler, Rfl: 2   Allergies  Allergen Reactions   Imitrex [Sumatriptan]     Nausea      The patient states she uses tubal ligation for birth control.  No LMP recorded.. Negative for Dysmenorrhea and Negative for Menorrhagia. Negative for: breast discharge, breast lump(s), breast pain and breast self exam. Associated symptoms include abnormal vaginal bleeding. Pertinent negatives include abnormal bleeding (hematology), anxiety, decreased libido, depression, difficulty falling sleep, dyspareunia, history of infertility, nocturia, sexual dysfunction, sleep disturbances, urinary incontinence, urinary urgency, vaginal discharge and vaginal itching. Diet regular.  She is not drinking sugary  drinks or sodas. She is eating 2 times a day. She eats twice a day, does not eat breakfast, only eats breakfast on weekends. The patient states her exercise level is walking 3 days a week for about 30 minutes.   The patient's tobacco use is:  Social History   Tobacco Use  Smoking Status Never  Smokeless Tobacco Never   She has been exposed to passive smoke. The patient's alcohol use is:  Social History   Substance and Sexual Activity  Alcohol Use No   Additional information: Last pap 11/05/2021, next one scheduled for 11/05/2024.    Review of Systems  Constitutional: Negative.   HENT: Negative.    Eyes: Negative.   Respiratory: Negative.    Cardiovascular: Negative.   Gastrointestinal: Negative.   Endocrine: Negative.   Genitourinary: Negative.   Musculoskeletal: Negative.   Skin: Negative.   Allergic/Immunologic: Negative.   Neurological: Negative.   Hematological: Negative.   Psychiatric/Behavioral: Negative.       Today's Vitals   10/01/22 1416  BP: 130/88  Pulse: 72  Temp: 98 F (36.7 C)  TempSrc: Oral  SpO2: 98%  Weight: 275 lb 9.6 oz (125 kg)  Height: 5' 8"  (1.727 m)   Body mass index is 41.9 kg/m.   Objective:  Physical Exam Vitals reviewed.  Constitutional:      General: She is not in acute distress.    Appearance: Normal appearance. She is well-developed. She is obese.  HENT:     Head: Normocephalic and atraumatic.     Right Ear: Hearing, tympanic membrane, ear canal and external ear normal. There is no impacted cerumen.     Left Ear: Hearing, tympanic membrane, ear canal and external ear normal. There is no impacted cerumen.     Nose:     Comments: Deferred - masked    Mouth/Throat:     Comments: Deferred - masked Eyes:     General: Lids are normal.     Extraocular Movements: Extraocular movements intact.     Conjunctiva/sclera: Conjunctivae normal.     Pupils: Pupils are equal, round, and reactive to light.     Funduscopic exam:    Right  eye: No papilledema.        Left eye: No papilledema.  Neck:     Thyroid: No thyroid mass.     Vascular: No carotid bruit.  Cardiovascular:     Rate and Rhythm: Normal rate and regular rhythm.     Pulses: Normal pulses.     Heart sounds: Normal heart sounds. No murmur heard. Pulmonary:     Effort: Pulmonary effort is normal. No respiratory distress.     Breath sounds: Normal breath sounds. No wheezing.  Abdominal:     General: Abdomen is flat. Bowel sounds are normal. There is no distension.     Palpations: Abdomen is soft.     Tenderness:  There is no abdominal tenderness.  Genitourinary:    Comments: N/A Musculoskeletal:        General: No swelling or tenderness. Normal range of motion.     Cervical back: Full passive range of motion without pain, normal range of motion and neck supple.     Right lower leg: No edema.     Left lower leg: No edema.  Skin:    General: Skin is warm and dry.     Capillary Refill: Capillary refill takes less than 2 seconds.  Neurological:     General: No focal deficit present.     Mental Status: She is alert and oriented to person, place, and time.     Cranial Nerves: No cranial nerve deficit.     Sensory: No sensory deficit.     Motor: No weakness.  Psychiatric:        Mood and Affect: Mood normal.        Behavior: Behavior normal.        Thought Content: Thought content normal.        Judgment: Judgment normal.         Assessment And Plan:     1. Annual physical exam Behavior modifications discussed and diet history reviewed.   Pt will continue to exercise regularly and modify diet with low GI, plant based foods and decrease intake of processed foods.  Recommend intake of daily multivitamin, Vitamin D, and calcium.  Recommend for preventive screenings, as well as recommend immunizations that include influenza, TDAP - CBC no Diff - CMP14+EGFR - Hemoglobin A1c - Lipid panel  2. Class 3 severe obesity with body mass index (BMI) of 40.0  to 44.9 in adult, unspecified obesity type, unspecified whether serious comorbidity present [facility]) Comments: She reports her pharmacy has wegovy in stock. I have sent another prescription but have discussed with her again this med is on [employer]. She is encouraged to strive for BMI less than 30 to decrease cardiac risk. Advised to aim for at least 150 minutes of exercise per week. - Semaglutide-Weight Management (WEGOVY) 0.5 MG/0.5ML SOAJ; Inject 0.5 mg into the skin once a week.  Dispense: 2 mL; Refill: 0  3. History of gestational diabetes  4. Insulin resistance Comments: she is unable to tolerate metformin. With her previous history of gestational diabetes which can lead to diabetes later in life. A goal is to focus on her weight loss.  - Hemoglobin A1c - Microalbumin / Creatinine Urine Ratio - POCT Urinalysis Dipstick (81002)  5. Rash and nonspecific skin eruption - valACYclovir (VALTREX) 500 MG tablet; Take 1 tablet (500 mg total) by mouth 2 (two) times daily for 5 days.  Dispense: 10 tablet; Refill: 0   Patient was given opportunity to ask questions. Patient verbalized understanding of the plan and was able to repeat key elements of the plan. All questions were answered to their satisfaction.   Minette Brine, FNP   I, Minette Brine, FNP, have reviewed all documentation for this visit. The documentation on 10/01/22 for the exam, diagnosis, procedures, and orders are all accurate and complete.   THE PATIENT IS ENCOURAGED TO PRACTICE SOCIAL DISTANCING DUE TO THE COVID-19 PANDEMIC.

## 2022-10-02 ENCOUNTER — Encounter: Payer: Self-pay | Admitting: Nurse Practitioner

## 2022-10-02 LAB — MICROALBUMIN / CREATININE URINE RATIO
Creatinine, Urine: 197.1 mg/dL
Microalb/Creat Ratio: 4 mg/g creat (ref 0–29)
Microalbumin, Urine: 7 ug/mL

## 2022-10-02 LAB — LIPID PANEL
Chol/HDL Ratio: 3.7 ratio (ref 0.0–4.4)
Cholesterol, Total: 170 mg/dL (ref 100–199)
HDL: 46 mg/dL (ref >39)
LDL Chol Calc (NIH): 108 mg/dL — ABNORMAL HIGH (ref 0–99)
Triglycerides: 87 mg/dL (ref 0–149)
VLDL Cholesterol Cal: 16 mg/dL (ref 5–40)

## 2022-10-02 LAB — CMP14+EGFR
ALT: 17 IU/L (ref 0–32)
AST: 20 IU/L (ref 0–40)
Albumin/Globulin Ratio: 1.4 (ref 1.2–2.2)
Albumin: 4.3 g/dL (ref 3.9–4.9)
Alkaline Phosphatase: 86 IU/L (ref 44–121)
BUN/Creatinine Ratio: 16 (ref 9–23)
BUN: 13 mg/dL (ref 6–20)
Bilirubin Total: 0.5 mg/dL (ref 0.0–1.2)
CO2: 23 mmol/L (ref 20–29)
Calcium: 9 mg/dL (ref 8.7–10.2)
Chloride: 102 mmol/L (ref 96–106)
Creatinine, Ser: 0.82 mg/dL (ref 0.57–1.00)
Globulin, Total: 3 g/dL (ref 1.5–4.5)
Glucose: 79 mg/dL (ref 70–99)
Potassium: 4.4 mmol/L (ref 3.5–5.2)
Sodium: 139 mmol/L (ref 134–144)
Total Protein: 7.3 g/dL (ref 6.0–8.5)
eGFR: 94 mL/min/{1.73_m2} (ref >59)

## 2022-10-02 LAB — HEMOGLOBIN A1C
Est. average glucose Bld gHb Est-mCnc: 100 mg/dL
Hgb A1c MFr Bld: 5.1 % (ref 4.8–5.6)

## 2022-10-02 LAB — CBC
Hematocrit: 34.4 % (ref 34.0–46.6)
Hemoglobin: 10.3 g/dL — ABNORMAL LOW (ref 11.1–15.9)
MCH: 23.3 pg — ABNORMAL LOW (ref 26.6–33.0)
MCHC: 29.9 g/dL — ABNORMAL LOW (ref 31.5–35.7)
MCV: 78 fL — ABNORMAL LOW (ref 79–97)
Platelets: 463 10*3/uL — ABNORMAL HIGH (ref 150–450)
RBC: 4.43 x10E6/uL (ref 3.77–5.28)
RDW: 16.5 % — ABNORMAL HIGH (ref 11.7–15.4)
WBC: 12.9 10*3/uL — ABNORMAL HIGH (ref 3.4–10.8)

## 2022-10-03 ENCOUNTER — Encounter: Payer: Self-pay | Admitting: Nurse Practitioner

## 2022-10-07 LAB — SPECIMEN STATUS REPORT

## 2022-10-07 LAB — IRON AND TIBC
Iron Saturation: 5 % — CL (ref 15–55)
Iron: 21 ug/dL — ABNORMAL LOW (ref 27–159)
Total Iron Binding Capacity: 387 ug/dL (ref 250–450)
UIBC: 366 ug/dL (ref 131–425)

## 2022-10-07 LAB — FERRITIN: Ferritin: 10 ng/mL — ABNORMAL LOW (ref 15–150)

## 2022-10-07 LAB — VITAMIN B12: Vitamin B-12: 519 pg/mL (ref 232–1245)

## 2022-10-07 LAB — TSH: TSH: 1.35 u[IU]/mL (ref 0.450–4.500)

## 2022-10-08 ENCOUNTER — Encounter: Payer: Self-pay | Admitting: Nurse Practitioner

## 2022-10-08 ENCOUNTER — Other Ambulatory Visit: Payer: Self-pay | Admitting: Nurse Practitioner

## 2022-10-08 ENCOUNTER — Other Ambulatory Visit (HOSPITAL_COMMUNITY): Payer: Self-pay

## 2022-10-08 DIAGNOSIS — D509 Iron deficiency anemia, unspecified: Secondary | ICD-10-CM

## 2022-10-08 DIAGNOSIS — G43009 Migraine without aura, not intractable, without status migrainosus: Secondary | ICD-10-CM

## 2022-10-08 MED ORDER — MAGNESIUM 250 MG PO TABS: 1.0000 | ORAL_TABLET | Freq: Every evening | ORAL | 0 refills | Status: DC

## 2022-10-08 MED ORDER — FERROUS SULFATE 325 (65 FE) MG PO TBEC
325.0000 mg | DELAYED_RELEASE_TABLET | Freq: Two times a day (BID) | ORAL | 3 refills | Status: DC
  Filled 2022-10-08: qty 60, 30d supply, fill #0

## 2022-10-09 ENCOUNTER — Other Ambulatory Visit: Payer: Self-pay | Admitting: Nurse Practitioner

## 2022-10-09 ENCOUNTER — Other Ambulatory Visit (HOSPITAL_COMMUNITY): Payer: Self-pay

## 2022-10-09 DIAGNOSIS — Z8632 Personal history of gestational diabetes: Secondary | ICD-10-CM

## 2022-10-10 ENCOUNTER — Other Ambulatory Visit (HOSPITAL_COMMUNITY): Payer: Self-pay

## 2022-10-10 MED ORDER — PANTOPRAZOLE SODIUM 20 MG PO TBEC
20.0000 mg | DELAYED_RELEASE_TABLET | Freq: Every day | ORAL | 1 refills | Status: DC
  Filled 2022-10-10: qty 90, 90d supply, fill #0

## 2022-10-14 ENCOUNTER — Other Ambulatory Visit: Payer: Self-pay | Admitting: Nurse Practitioner

## 2022-10-14 MED ORDER — ACCRUFER 30 MG PO CAPS: 1.0000 | ORAL_CAPSULE | Freq: Every day | ORAL | 5 refills | Status: DC

## 2022-10-20 ENCOUNTER — Other Ambulatory Visit (HOSPITAL_COMMUNITY): Payer: Self-pay

## 2022-10-28 ENCOUNTER — Other Ambulatory Visit: Payer: Self-pay | Admitting: Nurse Practitioner

## 2022-10-28 ENCOUNTER — Other Ambulatory Visit (HOSPITAL_COMMUNITY): Payer: Self-pay

## 2022-10-28 DIAGNOSIS — D691 Qualitative platelet defects: Secondary | ICD-10-CM

## 2022-10-28 DIAGNOSIS — R5383 Other fatigue: Secondary | ICD-10-CM

## 2022-10-28 DIAGNOSIS — D509 Iron deficiency anemia, unspecified: Secondary | ICD-10-CM

## 2022-10-28 DIAGNOSIS — G43009 Migraine without aura, not intractable, without status migrainosus: Secondary | ICD-10-CM

## 2022-10-28 MED ORDER — AJOVY 225 MG/1.5ML ~~LOC~~ SOAJ
225.0000 mg | SUBCUTANEOUS | 3 refills | Status: DC
  Filled 2022-10-28 – 2022-12-24 (×2): qty 1.5, 30d supply, fill #0
  Filled 2023-01-28: qty 1.5, 30d supply, fill #1
  Filled 2023-03-10: qty 1.5, 30d supply, fill #2
  Filled 2023-06-12: qty 1.5, 30d supply, fill #3

## 2022-11-04 ENCOUNTER — Telehealth: Payer: Self-pay | Admitting: Hematology and Oncology

## 2022-11-04 ENCOUNTER — Other Ambulatory Visit (HOSPITAL_COMMUNITY): Payer: Self-pay

## 2022-11-04 NOTE — Telephone Encounter (Signed)
Scheduled appointment per 11/07 referral. Patient is aware of appointment date and time. Patient is aware to arrive 15 mins prior to appointment time and to bring updated insurance cards. Patient is aware of location.   

## 2022-11-10 ENCOUNTER — Encounter
Payer: No Typology Code available for payment source | Attending: Physical Medicine and Rehabilitation | Admitting: Physical Medicine and Rehabilitation

## 2022-11-12 ENCOUNTER — Other Ambulatory Visit (HOSPITAL_BASED_OUTPATIENT_CLINIC_OR_DEPARTMENT_OTHER): Payer: Self-pay | Admitting: Nurse Practitioner

## 2022-11-12 ENCOUNTER — Other Ambulatory Visit (HOSPITAL_BASED_OUTPATIENT_CLINIC_OR_DEPARTMENT_OTHER): Payer: Self-pay

## 2022-11-12 DIAGNOSIS — L732 Hidradenitis suppurativa: Secondary | ICD-10-CM

## 2022-11-12 MED ORDER — DOXYCYCLINE HYCLATE 100 MG PO TABS
100.0000 mg | ORAL_TABLET | Freq: Two times a day (BID) | ORAL | 4 refills | Status: DC
  Filled 2022-11-12: qty 14, 7d supply, fill #0

## 2022-11-12 MED ORDER — FLUCONAZOLE 150 MG PO TABS
150.0000 mg | ORAL_TABLET | ORAL | 2 refills | Status: DC
  Filled 2022-11-12: qty 2, 3d supply, fill #0

## 2022-11-17 ENCOUNTER — Telehealth: Payer: Self-pay | Admitting: Hematology and Oncology

## 2022-11-17 ENCOUNTER — Other Ambulatory Visit: Payer: Self-pay

## 2022-11-17 ENCOUNTER — Inpatient Hospital Stay: Payer: No Typology Code available for payment source

## 2022-11-17 ENCOUNTER — Inpatient Hospital Stay
Payer: No Typology Code available for payment source | Attending: Hematology and Oncology | Admitting: Hematology and Oncology

## 2022-11-17 VITALS — BP 131/75 | HR 68 | Temp 97.9°F | Resp 14 | Wt 277.4 lb

## 2022-11-17 DIAGNOSIS — E119 Type 2 diabetes mellitus without complications: Secondary | ICD-10-CM | POA: Insufficient documentation

## 2022-11-17 DIAGNOSIS — I2699 Other pulmonary embolism without acute cor pulmonale: Secondary | ICD-10-CM

## 2022-11-17 DIAGNOSIS — E662 Morbid (severe) obesity with alveolar hypoventilation: Secondary | ICD-10-CM | POA: Insufficient documentation

## 2022-11-17 DIAGNOSIS — D75839 Thrombocytosis, unspecified: Secondary | ICD-10-CM

## 2022-11-17 DIAGNOSIS — N939 Abnormal uterine and vaginal bleeding, unspecified: Secondary | ICD-10-CM | POA: Insufficient documentation

## 2022-11-17 DIAGNOSIS — D7289 Other specified disorders of white blood cells: Secondary | ICD-10-CM

## 2022-11-17 DIAGNOSIS — N399 Disorder of urinary system, unspecified: Secondary | ICD-10-CM

## 2022-11-17 DIAGNOSIS — L732 Hidradenitis suppurativa: Secondary | ICD-10-CM | POA: Insufficient documentation

## 2022-11-17 DIAGNOSIS — D72829 Elevated white blood cell count, unspecified: Secondary | ICD-10-CM | POA: Diagnosis not present

## 2022-11-17 DIAGNOSIS — D500 Iron deficiency anemia secondary to blood loss (chronic): Secondary | ICD-10-CM

## 2022-11-17 LAB — C-REACTIVE PROTEIN: CRP: 1.2 mg/dL — ABNORMAL HIGH (ref <1.0)

## 2022-11-17 LAB — CBC WITH DIFFERENTIAL (CANCER CENTER ONLY)
Abs Immature Granulocytes: 0.06 10*3/uL (ref 0.00–0.07)
Basophils Absolute: 0.1 10*3/uL (ref 0.0–0.1)
Basophils Relative: 0 %
Eosinophils Absolute: 0.2 10*3/uL (ref 0.0–0.5)
Eosinophils Relative: 2 %
HCT: 36.2 % (ref 36.0–46.0)
Hemoglobin: 10.9 g/dL — ABNORMAL LOW (ref 12.0–15.0)
Immature Granulocytes: 0 %
Lymphocytes Relative: 25 %
Lymphs Abs: 3.5 10*3/uL (ref 0.7–4.0)
MCH: 23.7 pg — ABNORMAL LOW (ref 26.0–34.0)
MCHC: 30.1 g/dL (ref 30.0–36.0)
MCV: 78.9 fL — ABNORMAL LOW (ref 80.0–100.0)
Monocytes Absolute: 1 10*3/uL (ref 0.1–1.0)
Monocytes Relative: 7 %
Neutro Abs: 9.3 10*3/uL — ABNORMAL HIGH (ref 1.7–7.7)
Neutrophils Relative %: 66 %
Platelet Count: 429 10*3/uL — ABNORMAL HIGH (ref 150–400)
RBC: 4.59 MIL/uL (ref 3.87–5.11)
RDW: 18 % — ABNORMAL HIGH (ref 11.5–15.5)
WBC Count: 14.1 10*3/uL — ABNORMAL HIGH (ref 4.0–10.5)
nRBC: 0 % (ref 0.0–0.2)

## 2022-11-17 LAB — CMP (CANCER CENTER ONLY)
ALT: 19 U/L (ref 0–44)
AST: 18 U/L (ref 15–41)
Albumin: 4.3 g/dL (ref 3.5–5.0)
Alkaline Phosphatase: 78 U/L (ref 38–126)
Anion gap: 7 (ref 5–15)
BUN: 11 mg/dL (ref 6–20)
CO2: 28 mmol/L (ref 22–32)
Calcium: 9.2 mg/dL (ref 8.9–10.3)
Chloride: 105 mmol/L (ref 98–111)
Creatinine: 0.72 mg/dL (ref 0.44–1.00)
GFR, Estimated: >60 mL/min (ref >60)
Glucose, Bld: 83 mg/dL (ref 70–99)
Potassium: 3.9 mmol/L (ref 3.5–5.1)
Sodium: 140 mmol/L (ref 135–145)
Total Bilirubin: 0.7 mg/dL (ref 0.3–1.2)
Total Protein: 8.3 g/dL — ABNORMAL HIGH (ref 6.5–8.1)

## 2022-11-17 LAB — RETIC PANEL
Immature Retic Fract: 28.6 % — ABNORMAL HIGH (ref 2.3–15.9)
RBC.: 4.55 MIL/uL (ref 3.87–5.11)
Retic Count, Absolute: 100.1 10*3/uL (ref 19.0–186.0)
Retic Ct Pct: 2.2 % (ref 0.4–3.1)
Reticulocyte Hemoglobin: 24 pg — ABNORMAL LOW (ref >27.9)

## 2022-11-17 LAB — IRON AND IRON BINDING CAPACITY (CC-WL,HP ONLY)
Iron: 19 ug/dL — ABNORMAL LOW (ref 28–170)
Saturation Ratios: 4 % — ABNORMAL LOW (ref 10.4–31.8)
TIBC: 459 ug/dL — ABNORMAL HIGH (ref 250–450)
UIBC: 440 ug/dL (ref 148–442)

## 2022-11-17 LAB — SEDIMENTATION RATE: Sed Rate: 25 mm/hr — ABNORMAL HIGH (ref 0–22)

## 2022-11-17 NOTE — Progress Notes (Signed)
[place] [phone]   Fax:([phone]) [place] NOTE  Patient Care Team: Minette Brine, FNP as PCP - General (General Practice)  Hematological/Oncological History # Iron Deficiency Anemia 2/2 GI Bleeding  10/01/2022: WBC 12.9, Hgb 10.3, MCV 78, Plt 463. Iron sat 4%, Ferritin 10 11/17/2022: establish care with Dr. Lorenso Courier   CHIEF COMPLAINTS/PURPOSE OF CONSULTATION:  "Iron Deficiency Anemia "  HISTORY OF PRESENTING ILLNESS:  Audrey [name] 36 y.o. female with medical history significant for type 2 diabetes, GERD, headaches, morbid obesity, and arthritis who presents for evaluation of iron deficiency anemia secondary to GYN bleeding.  On review of the previous records [name] had labs drawn on 10/01/2022 which showed white blood cell count 12.9, hemoglobin 10.3, MCV 78, and platelets of 463.  She had an iron sat of 4% and ferritin of 10.  Due to concern for her iron deficiency anemia despite p.o. iron therapy she was referred to hematology for further evaluation and management.  On exam today [name] reports that she has had issues with anemia since her daughter was born 24 years ago.  She reports that she always has anemia but recently has been "getting worse".  She also has an increasing white blood cell count which is concerning to her.  She notes that her hidradenitis suppurativa has been active.  She notes that she has difficulty with the iron pills and that they cause constipation.  She generally struggles with alternating constipation and diarrhea.  She notes that her menstrual cycles typically last about 5 days and days 1-2 are quite heavy.  She wears a tampon and pad and has to change a tampon every 3 hours.  The pad is only changed if there is overflow.  She reports that her diet does include red meat about twice per week.  She is not having any bleeding from any other sources.  On further discussion she notes that she is a never smoker, never  drinker, and currently works as a Psychologist, sport and exercise at [employer].  She notes that she was adopted at age 63 and does not know her family history particular well.  She does struggle with fatigue but no issues with ice cravings or shortness of breath.  She is having some occasional lightheadedness and dizziness.  She otherwise denies any fevers, chills, sweats.  A full 10 point ROS was otherwise negative.  MEDICAL HISTORY:  Past Medical History:  Diagnosis Date   Arthritis    Atypical chest pain 10/03/2020   Common migraine with intractable migraine 01/23/2017   Diabetes mellitus without complication ([place])    Fetal demise 05/19/2013   GERD (gastroesophageal reflux disease)    with pregnancy   Gestational diabetes    Headache    migraines   Hemorrhage after delivery of fetus 08/12/2006   Morbidly obese ([place]) 01/23/2017   Preeclampsia    Pregnancy induced hypertension    S/P cesarean section 05/20/2013   S/P cesarean section 11/18/2014    SURGICAL HISTORY: Past Surgical History:  Procedure Laterality Date   CESAREAN SECTION     CESAREAN SECTION N/A 05/19/2013   Procedure: CESAREAN SECTION;  Surgeon: Thornell Sartorius, MD;  Location: [facility];  Service: Obstetrics;  Laterality: N/A;   CESAREAN SECTION WITH BILATERAL TUBAL LIGATION Bilateral 11/17/2014   Procedure: CESAREAN SECTION WITH BILATERAL TUBAL LIGATION;  Surgeon: Audrey Contes, MD;  Location: [facility];  Service: Obstetrics;  Laterality: Bilateral;   TOENAIL EXCISION     WISDOM  TOOTH EXTRACTION      SOCIAL HISTORY: Social History   Socioeconomic History   Marital status: Single    Spouse name: Not on file   Number of children: 1   Years of education: 12+   Highest education level: Not on file  Occupational History    Employer: [employer]  Tobacco Use   Smoking status: Never   Smokeless tobacco: Never  Vaping Use   Vaping Use: Never used  Substance and Sexual Activity   Alcohol use: No   Drug use: No   Sexual  activity: Not Currently  Other Topics Concern   Not on file  Social History Narrative   Patient lives at home with daughter.    Patient has has 1 child and one on the way.    Patient has some college education.    Patient works at [employer].          Social Determinants of Radio broadcast assistant Strain: Not on file  Food Insecurity: Not on file  Transportation Needs: Not on file  Physical Activity: Not on file  Stress: Not on file  Social Connections: Not on file  Intimate Partner Violence: Not on file    FAMILY HISTORY: Family History  Adopted: Yes  Problem Relation Age of Onset   Diabetes Mother    Asthma Other    Hyperlipidemia Other    Obesity Other    Diabetes Maternal Grandmother    Hypertension Maternal Grandmother    Diabetes Paternal Grandmother    Hypertension Paternal Grandmother     ALLERGIES:  is allergic to sumatriptan.  MEDICATIONS:  Current Outpatient Medications  Medication Sig Dispense Refill   doxycycline (VIBRA-TABS) 100 MG tablet Take 1 tab (163m) by mouth twice a day for abscess. (Patient not taking: Reported on 11/17/2022) 14 tablet 4   fluconazole (DIFLUCAN) 150 MG tablet Take one tablet by mouth at the first sign of symptoms of yeast. If no resolution, repeat dose in 72 hours. 2 tablet 2   aspirin-acetaminophen-caffeine (EXCEDRIN MIGRAINE) 250-250-65 MG tablet Take 2 tablets by mouth every 8 (eight) hours as needed for headache.      dicyclomine (BENTYL) 10 MG capsule Take 1 capsule (10 mg total) by mouth 4 (four) times daily - before meals and at bedtime. 90 capsule 2   Ferric Maltol (ACCRUFER) 30 MG CAPS Take 1 tablet by mouth daily. 30 capsule 5   Fremanezumab-vfrm (AJOVY) 225 MG/1.5ML SOAJ Inject 225 mg into the skin every 30 (thirty) days. 1.5 mL 3   ibuprofen (ADVIL) 200 MG tablet Take 200 mg by mouth every 6 (six) hours as needed.     Magnesium 250 MG TABS Take 1 tablet (250 mg total) by mouth every evening. Take with evening  meal 30 tablet 0   meclizine (ANTIVERT) 12.5 MG tablet Take 1 tablet (12.5 mg total) by mouth 3 (three) times daily as needed for dizziness. 30 tablet 0   nystatin powder Apply 1 application topically 3 (three) times daily. (Patient not taking: Reported on 10/01/2022) 15 g 0   ondansetron (ZOFRAN) 4 MG tablet Take 1 tablet (4 mg total) by mouth daily as needed for nausea or vomiting. 30 tablet 1   pantoprazole (PROTONIX) 20 MG tablet Take 1 tablet (20 mg total) by mouth daily. 90 tablet 1   Semaglutide-Weight Management (WEGOVY) 0.5 MG/0.5ML SOAJ Inject 0.5 mg into the skin once a week. 2 mL 0   triamcinolone (NASACORT) 55 MCG/ACT AERO nasal inhaler Place 1  spray into the nose 2 (two) times daily. 1 Inhaler 2   valACYclovir (VALTREX) 1000 MG tablet TAKE 2 TABLETS (2,000 MG TOTAL) BY MOUTH 2 (TWO) TIMES DAILY.     No current facility-administered medications for this visit.    REVIEW OF SYSTEMS:   Constitutional: ( - ) fevers, ( - )  chills , ( - ) night sweats Eyes: ( - ) blurriness of vision, ( - ) double vision, ( - ) watery eyes Ears, nose, mouth, throat, and face: ( - ) mucositis, ( - ) sore throat Respiratory: ( - ) cough, ( - ) dyspnea, ( - ) wheezes Cardiovascular: ( - ) palpitation, ( - ) chest discomfort, ( - ) lower extremity swelling Gastrointestinal:  ( - ) nausea, ( - ) heartburn, ( - ) change in bowel habits Skin: ( - ) abnormal skin rashes Lymphatics: ( - ) new lymphadenopathy, ( - ) easy bruising Neurological: ( - ) numbness, ( - ) tingling, ( - ) new weaknesses Behavioral/Psych: ( - ) mood change, ( - ) new changes  All other systems were reviewed with the patient and are negative.  PHYSICAL EXAMINATION:  Vitals:   11/17/22 1403  BP: 131/75  Pulse: 68  Resp: 14  Temp: 97.9 F (36.6 C)  SpO2: 100%   Filed Weights   11/17/22 1403  Weight: 277 lb 6.4 oz (125.8 kg)    GENERAL: well appearing middle-aged African-American female in NAD  SKIN: skin color, texture,  turgor are normal, no rashes or significant lesions EYES: conjunctiva are pink and non-injected, sclera clear LUNGS: clear to auscultation and percussion with normal breathing effort HEART: regular rate & rhythm and no murmurs and no lower extremity edema Musculoskeletal: no cyanosis of digits and no clubbing  PSYCH: alert & oriented x 3, fluent speech NEURO: no focal motor/sensory deficits  LABORATORY DATA:  I have reviewed the data as listed    Latest Ref Rng & Units 10/01/2022    4:22 PM 09/25/2021    3:14 PM 09/25/2021   12:00 AM  CBC  WBC 3.4 - 10.8 x10E3/uL 12.9  11.3  11.3      Hemoglobin 11.1 - 15.9 g/dL 10.3  11.0  11.0      Hematocrit 34.0 - 46.6 % 34.4  35.8    Platelets 150 - 450 x10E3/uL 463  406  406         This result is from an external source.       Latest Ref Rng & Units 10/01/2022    4:22 PM 09/25/2021    3:14 PM 12/25/2020   11:55 AM  CMP  Glucose 70 - 99 mg/dL 79  86  61   BUN 6 - 20 mg/dL _0 Creatinine 0.57 - 1.00 mg/dL 0.82  0.71  0.72   Sodium 134 - 144 mmol/L 139  140  140   Potassium 3.5 - 5.2 mmol/L 4.4  3.9  4.2   Chloride 96 - 106 mmol/L 102  105  103   CO2 20 - 29 mmol/L _1 Calcium 8.7 - 10.2 mg/dL 9.0  9.2  8.9   Total Protein 6.0 - 8.5 g/dL 7.3  7.5    Total Bilirubin 0.0 - 1.2 mg/dL 0.5  0.6    Alkaline Phos 44 - 121 IU/L 86     AST 0 - 40 IU/L 20  18    ALT 0 -  32 IU/L 17  15       ASSESSMENT & PLAN Audrey [name] 36 y.o. female with medical history significant for type 2 diabetes, GERD, headaches, morbid obesity, and arthritis who presents for evaluation of iron deficiency anemia secondary to GYN bleeding.  After review of the labs, review of the records, and discussion with the patient the patients findings are most consistent with iron deficiency anemia secondary to GYN bleeding.  # Iron Deficiency Anemia 2/2 to GYN Bleeding -- Findings are consistent with iron deficiency anemia secondary to patient's  menorrhagia --Encouraged her to follow-up with OB/GYN for better control of her menstrual cycles --We will confirm iron deficiency anemia by ordering iron panel and ferritin as well as reticulocytes, CBC, and CMP --Continue ferrous sulfate 325 mg daily with a source of vitamin C --We will plan to proceed with IV iron therapy in order to help bolster the patient's blood counts --Plan for return to clinic in 4 to 6 weeks time after last dose of IV iron  # Leukocytosis, Neutrophilic Predominant -- Suspect this is secondary to her hidradenitis suppurativa. -- We will order ESR and CRP today -- Continue to monitor.   No orders of the defined types were placed in this encounter.   All questions were answered. The patient knows to call the clinic with any problems, questions or concerns.  A total of more than 60 minutes were spent on this encounter with face-to-face time and non-face-to-face time, including preparing to see the patient, ordering tests and/or medications, counseling the patient and coordination of care as outlined above.   Ledell Peoples, MD Department of Hematology/Oncology [place] [facility] Phone: [phone] Pager: [phone] Email: [email or web address]  11/17/2022 2:10 PM

## 2022-11-17 NOTE — Telephone Encounter (Signed)
Per 11/20 los called and spoke to pt about appointment  

## 2022-11-18 ENCOUNTER — Telehealth: Payer: Self-pay | Admitting: Pharmacy Technician

## 2022-11-18 LAB — FERRITIN: Ferritin: 7 ng/mL — ABNORMAL LOW (ref 11–307)

## 2022-11-18 NOTE — Telephone Encounter (Signed)
Dr. Leonides Schanz, Monoferric is non preferred medication with [facility] and will be denied if patient has not tried and or failed venofer.  Venofer is the prerferred medication. Would you like to try venofer?

## 2022-11-19 ENCOUNTER — Other Ambulatory Visit: Payer: Self-pay | Admitting: Pharmacy Technician

## 2022-11-24 ENCOUNTER — Encounter: Payer: Self-pay | Admitting: Hematology and Oncology

## 2022-11-25 ENCOUNTER — Encounter: Payer: Self-pay | Admitting: Nurse Practitioner

## 2022-11-27 ENCOUNTER — Other Ambulatory Visit: Payer: Self-pay | Admitting: Nurse Practitioner

## 2022-11-27 DIAGNOSIS — G43009 Migraine without aura, not intractable, without status migrainosus: Secondary | ICD-10-CM

## 2022-12-03 ENCOUNTER — Ambulatory Visit (INDEPENDENT_AMBULATORY_CARE_PROVIDER_SITE_OTHER): Payer: No Typology Code available for payment source

## 2022-12-03 VITALS — BP 121/81 | HR 75 | Temp 98.3°F | Resp 16 | Ht 68.0 in | Wt 282.6 lb

## 2022-12-03 DIAGNOSIS — D500 Iron deficiency anemia secondary to blood loss (chronic): Secondary | ICD-10-CM | POA: Diagnosis not present

## 2022-12-03 DIAGNOSIS — N920 Excessive and frequent menstruation with regular cycle: Secondary | ICD-10-CM

## 2022-12-03 MED ORDER — DIPHENHYDRAMINE HCL 25 MG PO CAPS
25.0000 mg | ORAL_CAPSULE | Freq: Once | ORAL | Status: AC
  Administered 2022-12-03: 25 mg via ORAL
  Filled 2022-12-03: qty 1

## 2022-12-03 MED ORDER — SODIUM CHLORIDE 0.9 % IV SOLN
200.0000 mg | Freq: Once | INTRAVENOUS | Status: AC
  Administered 2022-12-03: 200 mg via INTRAVENOUS
  Filled 2022-12-03: qty 10

## 2022-12-03 MED ORDER — ACETAMINOPHEN 325 MG PO TABS
650.0000 mg | ORAL_TABLET | Freq: Once | ORAL | Status: AC
  Administered 2022-12-03: 650 mg via ORAL
  Filled 2022-12-03: qty 2

## 2022-12-03 NOTE — Progress Notes (Signed)
Diagnosis: Iron Deficiency Anemia  Provider:  Chilton Greathouse MD  Procedure: Infusion  IV Type: Peripheral, IV Location: R Forearm  Venofer (Iron Sucrose), Dose: 200 mg  Infusion Start Time: 1447  Infusion Stop Time: 1503  Post Infusion IV Care: Observation period completed and Peripheral IV Discontinued  Discharge: Condition: Good, Destination: Home . AVS provided to patient.   Performed by:  Loney Hering, LPN

## 2022-12-08 ENCOUNTER — Encounter: Payer: Self-pay | Admitting: Hematology and Oncology

## 2022-12-10 ENCOUNTER — Ambulatory Visit (INDEPENDENT_AMBULATORY_CARE_PROVIDER_SITE_OTHER): Payer: No Typology Code available for payment source

## 2022-12-10 VITALS — BP 115/76 | HR 83 | Temp 98.2°F | Resp 18 | Ht 68.0 in | Wt 281.0 lb

## 2022-12-10 DIAGNOSIS — D500 Iron deficiency anemia secondary to blood loss (chronic): Secondary | ICD-10-CM | POA: Diagnosis not present

## 2022-12-10 MED ORDER — ACETAMINOPHEN 325 MG PO TABS
650.0000 mg | ORAL_TABLET | Freq: Once | ORAL | Status: AC
  Administered 2022-12-10: 650 mg via ORAL
  Filled 2022-12-10: qty 2

## 2022-12-10 MED ORDER — DIPHENHYDRAMINE HCL 25 MG PO CAPS
25.0000 mg | ORAL_CAPSULE | Freq: Once | ORAL | Status: AC
  Administered 2022-12-10: 25 mg via ORAL
  Filled 2022-12-10: qty 1

## 2022-12-10 MED ORDER — SODIUM CHLORIDE 0.9 % IV SOLN
200.0000 mg | Freq: Once | INTRAVENOUS | Status: AC
  Administered 2022-12-10: 200 mg via INTRAVENOUS
  Filled 2022-12-10: qty 10

## 2022-12-10 NOTE — Progress Notes (Addendum)
Diagnosis: Iron Deficiency Anemia  Provider:  Chilton Greathouse MD  Procedure: Infusion  IV Type: Peripheral, IV Location: R Antecubital  Venofer (Iron Sucrose), Dose: 200 mg  Infusion Start Time: 1434  Infusion Stop Time: 1455  Post Infusion IV Care: Peripheral IV Discontinued  Discharge: Condition: Good, Destination: Home . AVS provided to patient.   Performed by:  Garnette Czech, RN

## 2022-12-13 ENCOUNTER — Encounter: Payer: Self-pay | Admitting: Hematology and Oncology

## 2022-12-17 ENCOUNTER — Encounter: Payer: Self-pay | Admitting: Nurse Practitioner

## 2022-12-17 ENCOUNTER — Ambulatory Visit (INDEPENDENT_AMBULATORY_CARE_PROVIDER_SITE_OTHER): Payer: No Typology Code available for payment source

## 2022-12-17 VITALS — BP 120/80 | HR 69 | Temp 98.4°F | Resp 18 | Ht 68.0 in | Wt 280.2 lb

## 2022-12-17 DIAGNOSIS — D500 Iron deficiency anemia secondary to blood loss (chronic): Secondary | ICD-10-CM | POA: Diagnosis not present

## 2022-12-17 DIAGNOSIS — N920 Excessive and frequent menstruation with regular cycle: Secondary | ICD-10-CM

## 2022-12-17 MED ORDER — SODIUM CHLORIDE 0.9 % IV SOLN
200.0000 mg | Freq: Once | INTRAVENOUS | Status: AC
  Administered 2022-12-17: 200 mg via INTRAVENOUS
  Filled 2022-12-17: qty 10

## 2022-12-17 MED ORDER — DIPHENHYDRAMINE HCL 25 MG PO CAPS
25.0000 mg | ORAL_CAPSULE | Freq: Once | ORAL | Status: AC
  Administered 2022-12-17: 25 mg via ORAL
  Filled 2022-12-17: qty 1

## 2022-12-17 MED ORDER — ACETAMINOPHEN 325 MG PO TABS
650.0000 mg | ORAL_TABLET | Freq: Once | ORAL | Status: AC
  Administered 2022-12-17: 650 mg via ORAL
  Filled 2022-12-17: qty 2

## 2022-12-17 NOTE — Progress Notes (Signed)
Diagnosis: Iron Deficiency Anemia  Provider:  Chilton Greathouse MD  Procedure: Infusion  IV Type: Peripheral, IV Location: R Antecubital  Venofer (Iron Sucrose), Dose: 200 mg  Infusion Start Time: 1522  Infusion Stop Time: 1548  Post Infusion IV Care: Peripheral IV Discontinued  Discharge: Condition: Good, Destination: Home . AVS provided to patient.   Performed by:  Nat Math, RN

## 2022-12-23 ENCOUNTER — Ambulatory Visit (INDEPENDENT_AMBULATORY_CARE_PROVIDER_SITE_OTHER): Payer: No Typology Code available for payment source

## 2022-12-23 VITALS — BP 124/87 | HR 60 | Temp 98.6°F | Resp 22 | Ht 68.0 in | Wt 279.0 lb

## 2022-12-23 DIAGNOSIS — N920 Excessive and frequent menstruation with regular cycle: Secondary | ICD-10-CM | POA: Diagnosis not present

## 2022-12-23 DIAGNOSIS — D500 Iron deficiency anemia secondary to blood loss (chronic): Secondary | ICD-10-CM | POA: Diagnosis not present

## 2022-12-23 MED ORDER — SODIUM CHLORIDE 0.9 % IV SOLN
200.0000 mg | Freq: Once | INTRAVENOUS | Status: AC
  Administered 2022-12-23: 200 mg via INTRAVENOUS
  Filled 2022-12-23: qty 10

## 2022-12-23 MED ORDER — DIPHENHYDRAMINE HCL 25 MG PO CAPS
25.0000 mg | ORAL_CAPSULE | Freq: Once | ORAL | Status: AC
  Administered 2022-12-23: 25 mg via ORAL
  Filled 2022-12-23: qty 1

## 2022-12-23 MED ORDER — ACETAMINOPHEN 325 MG PO TABS
650.0000 mg | ORAL_TABLET | Freq: Once | ORAL | Status: AC
  Administered 2022-12-23: 650 mg via ORAL
  Filled 2022-12-23: qty 2

## 2022-12-23 NOTE — Progress Notes (Signed)
Diagnosis: Iron Deficiency Anemia  Provider:  Chilton Greathouse MD  Procedure: Infusion  IV Type: Peripheral, IV Location: R Antecubital  Venofer (Iron Sucrose), Dose: 200 mg  Infusion Start Time: 1048  Infusion Stop Time: 1105  Post Infusion IV Care: Peripheral IV Discontinued  Discharge: Condition: Good, Destination: Home . AVS declined.  Performed by:  Garnette Czech, RN

## 2022-12-24 ENCOUNTER — Other Ambulatory Visit (HOSPITAL_COMMUNITY): Payer: Self-pay

## 2022-12-24 ENCOUNTER — Ambulatory Visit: Payer: No Typology Code available for payment source

## 2022-12-30 ENCOUNTER — Ambulatory Visit (INDEPENDENT_AMBULATORY_CARE_PROVIDER_SITE_OTHER): Payer: Self-pay

## 2022-12-30 VITALS — BP 113/79 | HR 64 | Temp 98.4°F | Resp 18 | Ht 68.0 in | Wt 278.8 lb

## 2022-12-30 DIAGNOSIS — D500 Iron deficiency anemia secondary to blood loss (chronic): Secondary | ICD-10-CM

## 2022-12-30 DIAGNOSIS — N920 Excessive and frequent menstruation with regular cycle: Secondary | ICD-10-CM

## 2022-12-30 MED ORDER — DIPHENHYDRAMINE HCL 25 MG PO CAPS
25.0000 mg | ORAL_CAPSULE | Freq: Once | ORAL | Status: AC
  Administered 2022-12-30: 25 mg via ORAL
  Filled 2022-12-30: qty 1

## 2022-12-30 MED ORDER — ACETAMINOPHEN 325 MG PO TABS
650.0000 mg | ORAL_TABLET | Freq: Once | ORAL | Status: AC
  Administered 2022-12-30: 650 mg via ORAL
  Filled 2022-12-30: qty 2

## 2022-12-30 MED ORDER — SODIUM CHLORIDE 0.9 % IV SOLN
200.0000 mg | Freq: Once | INTRAVENOUS | Status: AC
  Administered 2022-12-30: 200 mg via INTRAVENOUS
  Filled 2022-12-30: qty 10

## 2022-12-30 NOTE — Progress Notes (Signed)
Diagnosis: Iron Deficiency Anemia  Provider:  Marshell Garfinkel MD  Procedure: Infusion  IV Type: Peripheral, IV Location: L Forearm  Venofer (Iron Sucrose), Dose: 200 mg  Infusion Start Time: 1509  Infusion Stop Time: 1526  Post Infusion IV Care: Peripheral IV Discontinued  Discharge: Condition: Good, Destination: Home . AVS provided to patient.   Performed by:  Cleophus Molt, RN

## 2023-01-12 ENCOUNTER — Ambulatory Visit (INDEPENDENT_AMBULATORY_CARE_PROVIDER_SITE_OTHER): Payer: 59 | Admitting: Nurse Practitioner

## 2023-01-12 ENCOUNTER — Encounter: Payer: Self-pay | Admitting: Nurse Practitioner

## 2023-01-12 ENCOUNTER — Other Ambulatory Visit (HOSPITAL_COMMUNITY): Payer: Self-pay

## 2023-01-12 VITALS — BP 124/80 | HR 74 | Temp 98.5°F | Ht 69.0 in | Wt 285.8 lb

## 2023-01-12 DIAGNOSIS — N921 Excessive and frequent menstruation with irregular cycle: Secondary | ICD-10-CM

## 2023-01-12 DIAGNOSIS — R21 Rash and other nonspecific skin eruption: Secondary | ICD-10-CM | POA: Diagnosis not present

## 2023-01-12 DIAGNOSIS — N926 Irregular menstruation, unspecified: Secondary | ICD-10-CM | POA: Diagnosis not present

## 2023-01-12 DIAGNOSIS — D500 Iron deficiency anemia secondary to blood loss (chronic): Secondary | ICD-10-CM

## 2023-01-12 DIAGNOSIS — E88819 Insulin resistance, unspecified: Secondary | ICD-10-CM | POA: Diagnosis not present

## 2023-01-12 MED ORDER — NYSTATIN-TRIAMCINOLONE 100000-0.1 UNIT/GM-% EX CREA
1.0000 | TOPICAL_CREAM | Freq: Two times a day (BID) | CUTANEOUS | 1 refills | Status: DC
  Filled 2023-01-12: qty 60, 30d supply, fill #0

## 2023-01-12 NOTE — Patient Instructions (Addendum)
It was a pleasure seeing you today! Thank you for trusting me with your care.   I want to check your labs today to see what is going on. I have ordered the ultrasound at [address] at [facility]. I feel like something is not right, but I will work hard to figure it out with you.    If labs were collected today, you will see the results as soon as they become available in [place]. I will review these labs once I have received all of the results and send you comments and recommendations, if any. If you have specific concerns, we can set up an appointment (virtual or in person) to go over details and come up with a plan together.   If you have received any referrals today, the office where the referral was made will be in contact with you to set up your appointment.   If you have received orders for imaging today, the imaging office will contact you to schedule this. Please note that some imaging requires a prior authorization from insurance, so an order is not a guarantee this will be covered by your insurance, but I want to assure you we will do our best to get this covered and if it is not, you will be notified and we can come up with an alternative plan.   If you take regular prescription medications, please contact your pharmacy for routine refill requests. They will send this directly to [place].  If you were ordered new medication as a part of your examination and treatment today, please contact your pharmacy to determine the status. Many prescriptions require a prior authorization and the pharmacy will contact us to get this started. This may take a few days for approval or denial. If the medication is denied, we will work with you to try alternative medications.   If you have any questions or concerns, please do not hesitate to contact the office via telephone or [place].  MyChart messages are received by the [facility] staff during regular business hours Monday through Friday and we do our best  to respond in a timely manner.  If your request requires an appointment, the staff will gladly help set that up so that we have the time dedicated to ensure that your questions are appropriately answered.

## 2023-01-12 NOTE — Progress Notes (Signed)
Audrey Eth, DNP, AGNP-c [facility] [address] [place], [place] [address] Main Office [phone]   New patient visit   Patient: Audrey [name]   DOB: 1986-08-11   37 y.o. Female  MRN: [id number] Visit Date: 01/12/2023  Patient Care Team: Tamerra Merkley, Sung Amabile, NP as PCP - General (Nurse Practitioner)  Today's Vitals   01/12/23 1349  BP: 124/80  Pulse: 74  Temp: 98.5 F (36.9 C)  SpO2: 100%  Weight: 285 lb 12.8 oz (129.6 kg)  Height: 5\' 9"  (1.753 m)   Body mass index is 42.21 kg/m.   Today's healthcare provider: , NP   Chief Complaint  Patient presents with   other    New pt. Est. Rash around neck, started about 2 weeks, burns,    Subjective    Audrey [name] is a 37 y.o. female who presents today as a new patient to establish care.    Patient endorses the following concerns presently: Rash Audrey [name] tells me she has a rash  that is running around the neck, in the cleavage area, and on her flank area, high under the bra line. She has not changed any soaps, lotions, perfumes, detergents, or body washes. The rash has been present for about two weeks. She tells me it burns and itches pretty bad. She has not had anything like this happen before. She has tried using hydrocortisone to the area and this has helped a little with the itching, but the burning is still present.  Heavy Menses Audrey [name] tells me that her periods have been extremely heavy for a while now. She tells me the first 2-3 days of her cycle she must wear a tampon and sanitary pad and still having to change every 1-2 hours. Symptoms have been present for about 16 years, but she tells me they seem to be worsening. She is iron deficient and has been getting iron infusions for this, but continues to loose excessive amounts of blood from menses.   History reviewed and reveals the following: Past Medical History:  Diagnosis Date   Arthritis    Atypical chest pain 10/03/2020   Common  migraine with intractable migraine 01/23/2017   Diabetes mellitus without complication (HCC)    Fetal demise 05/19/2013   GERD (gastroesophageal reflux disease)    with pregnancy   Gestational diabetes    Headache    migraines   Hemorrhage after delivery of fetus 08/12/2006   Morbidly obese (HCC) 01/23/2017   Preeclampsia    Pregnancy induced hypertension    S/P cesarean section 05/20/2013   S/P cesarean section 11/18/2014   Past Surgical History:  Procedure Laterality Date   CESAREAN SECTION     CESAREAN SECTION N/A 05/19/2013   Procedure: CESAREAN SECTION;  Surgeon: 05/21/2013, MD;  Location: [facility];  Service: Obstetrics;  Laterality: N/A;   CESAREAN SECTION WITH BILATERAL TUBAL LIGATION Bilateral 11/17/2014   Procedure: CESAREAN SECTION WITH BILATERAL TUBAL LIGATION;  Surgeon: 11/19/2014, MD;  Location: [facility];  Service: Obstetrics;  Laterality: Bilateral;   TOENAIL EXCISION     WISDOM TOOTH EXTRACTION     Family Status  Relation Name Status   Mother  Alive   Father  Alive   Other  (Not Specified)   Sister  Alive   Brother  Alive   Sister  Alive   MGM  (Not Specified)   PGM  (Not Specified)   Family History  Adopted: Yes  Problem Relation Age  of Onset   Diabetes Mother    Asthma Other    Hyperlipidemia Other    Obesity Other    Diabetes Maternal Grandmother    Hypertension Maternal Grandmother    Diabetes Paternal Grandmother    Hypertension Paternal Grandmother    Social History   Socioeconomic History   Marital status: Single    Spouse name: Not on file   Number of children: 1   Years of education: 12+   Highest education level: Not on file  Occupational History    Employer: [employer]  Tobacco Use   Smoking status: Never   Smokeless tobacco: Never  Vaping Use   Vaping Use: Never used  Substance and Sexual Activity   Alcohol use: No   Drug use: No   Sexual activity: Not Currently  Other Topics Concern   Not on file  Social History Narrative    Patient lives at home with daughter.    Patient has has 1 child and one on the way.    Patient has some college education.    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          Social Determinants of Health   Financial Resource Strain: Not on file  Food Insecurity: Not on file  Transportation Needs: Not on file  Physical Activity: Not on file  Stress: Not on file  Social Connections: Not on file   Outpatient Medications Prior to Visit  Medication Sig Note   dicyclomine (BENTYL) 10 MG capsule Take 1 capsule (10 mg total) by mouth 4 (four) times daily - before meals and at bedtime.    doxycycline (VIBRA-TABS) 100 MG tablet Take 1 tab (100mg ) by mouth twice a day for abscess. (Patient not taking: Reported on 11/17/2022)    Ferric Maltol (ACCRUFER) 30 MG CAPS Take 1 tablet by mouth daily.    Fremanezumab-vfrm (AJOVY) 225 MG/1.5ML SOAJ Inject 225 mg into the skin every 30 (thirty) days.    ibuprofen (ADVIL) 200 MG tablet Take 200 mg by mouth every 6 (six) hours as needed.    meclizine (ANTIVERT) 12.5 MG tablet Take 1 tablet (12.5 mg total) by mouth 3 (three) times daily as needed for dizziness.    pantoprazole (PROTONIX) 20 MG tablet Take 1 tablet (20 mg total) by mouth daily. 01/12/2023: prn   valACYclovir (VALTREX) 1000 MG tablet TAKE 2 TABLETS (2,000 MG TOTAL) BY MOUTH 2 (TWO) TIMES DAILY.    aspirin-acetaminophen-caffeine (EXCEDRIN MIGRAINE) 250-250-65 MG tablet Take 2 tablets by mouth every 8 (eight) hours as needed for headache.  (Patient not taking: Reported on 01/12/2023)    Magnesium 250 MG TABS Take 1 tablet (250 mg total) by mouth every evening. Take with evening meal (Patient not taking: Reported on 01/12/2023)    nystatin powder Apply 1 application topically 3 (three) times daily. (Patient not taking: Reported on 10/01/2022)    ondansetron (ZOFRAN) 4 MG tablet Take 1 tablet (4 mg total) by mouth daily as needed for nausea or vomiting. (Patient not taking: Reported on 01/12/2023)     Semaglutide-Weight Management (WEGOVY) 0.5 MG/0.5ML SOAJ Inject 0.5 mg into the skin once a week. (Patient not taking: Reported on 01/12/2023)    triamcinolone (NASACORT) 55 MCG/ACT AERO nasal inhaler Place 1 spray into the nose 2 (two) times daily.    [DISCONTINUED] fluconazole (DIFLUCAN) 150 MG tablet Take one tablet by mouth at the first sign of symptoms of yeast. If no resolution, repeat dose in 72 hours.    No facility-administered medications prior  to visit.   Allergies  Allergen Reactions   Sumatriptan Other (See Comments)    Nausea  Nausea  Nausea   Immunization History  Administered Date(s) Administered   Influenza Split 10/27/2014   Influenza-Unspecified 10/09/2020, 10/03/2022   PFIZER(Purple Top)SARS-COV-2 Vaccination 08/10/2020, 09/07/2020   Tdap 10/03/2014    Health Maintenance Due Health Maintenance Topics with due status: Overdue     Topic Date Due   COVID-19 Vaccine 10/05/2020   FOOT EXAM 09/25/2022   Health Maintenance Topics with due status: Due On     Topic Date Due   OPHTHALMOLOGY EXAM 12/25/2022    Review of Systems All review of systems negative except what is listed in the HPI   Objective    BP 124/80   Pulse 74   Temp 98.5 F (36.9 C)   Ht 5\' 9"  (1.753 m)   Wt 285 lb 12.8 oz (129.6 kg)   LMP 01/12/2022   SpO2 100%   BMI 42.21 kg/m  Physical Exam Vitals and nursing note reviewed.  Constitutional:      Appearance: Normal appearance.  HENT:     Head: Normocephalic.  Eyes:     Extraocular Movements: Extraocular movements intact.     Pupils: Pupils are equal, round, and reactive to light.  Cardiovascular:     Rate and Rhythm: Normal rate and regular rhythm.     Pulses: Normal pulses.     Heart sounds: Normal heart sounds.  Pulmonary:     Effort: Pulmonary effort is normal.     Breath sounds: Normal breath sounds.  Abdominal:     General: Bowel sounds are normal. There is no distension.     Palpations: Abdomen is soft. There is no  mass.     Tenderness: There is no abdominal tenderness. There is no guarding or rebound.  Musculoskeletal:     Cervical back: Normal range of motion.  Skin:    General: Skin is warm and dry.     Capillary Refill: Capillary refill takes less than 2 seconds.     Findings: Rash present. Rash is macular.       Neurological:     General: No focal deficit present.     Mental Status: She is alert and oriented to person, place, and time.  Psychiatric:        Mood and Affect: Mood normal.        Behavior: Behavior normal.        Thought Content: Thought content normal.        Judgment: Judgment normal.     Results for orders placed or performed in visit on 01/12/23  TestT+TestF+SHBG  Result Value Ref Range   Testosterone, Total, LC/MS 23.9 10.0 - 55.0 ng/dL   Testosterone, Free WILL FOLLOW    Sex Hormone Binding 48.3 24.6 - 122.0 nmol/L  TSH  Result Value Ref Range   TSH 1.960 0.450 - 4.500 uIU/mL  T4, free  Result Value Ref Range   Free T4 1.01 0.82 - 1.77 ng/dL  Hemoglobin 01/14/23  Result Value Ref Range   Hgb A1c MFr Bld 4.8 4.8 - 5.6 %   Est. average glucose Bld gHb Est-mCnc 91 mg/dL  P3I  Result Value Ref Range   17-OH Progesterone LCMS <10 ng/dL  Testosterone  Result Value Ref Range   Testosterone 6 (L) 8 - 60 ng/dL  Prolactin  Result Value Ref Range   Prolactin 12.3 4.8 - 33.4 ng/mL  Fe+CBC/D/Plt+TIBC+Fer+Retic  Result Value Ref Range  Total Iron Binding Capacity 289 250 - 450 ug/dL   UIBC 243 131 - 425 ug/dL   Iron 46 27 - 159 ug/dL   Iron Saturation 16 15 - 55 %   Ferritin 136 15 - 150 ng/mL   WBC 11.1 (H) 3.4 - 10.8 x10E3/uL   RBC 4.67 3.77 - 5.28 x10E6/uL   Hemoglobin 12.2 11.1 - 15.9 g/dL   Hematocrit 38.8 34.0 - 46.6 %   MCV 83 79 - 97 fL   MCH 26.1 (L) 26.6 - 33.0 pg   MCHC 31.4 (L) 31.5 - 35.7 g/dL   RDW 19.3 (H) 11.7 - 15.4 %   Platelets 371 150 - 450 x10E3/uL   Neutrophils 63 Not Estab. %   Lymphs 24 Not Estab. %   Monocytes 8  Not Estab. %   Eos 3 Not Estab. %   Basos 1 Not Estab. %   Neutrophils Absolute 7.2 (H) 1.4 - 7.0 x10E3/uL   Lymphocytes Absolute 2.7 0.7 - 3.1 x10E3/uL   Monocytes Absolute 0.9 0.1 - 0.9 x10E3/uL   EOS (ABSOLUTE) 0.3 0.0 - 0.4 x10E3/uL   Basophils Absolute 0.1 0.0 - 0.2 x10E3/uL   Immature Granulocytes 1 Not Estab. %   Immature Grans (Abs) 0.1 0.0 - 0.1 x10E3/uL   Retic Ct Pct 2.1 0.6 - 2.6 %  Thyroid peroxidase antibody  Result Value Ref Range   Thyroperoxidase Ab SerPl-aCnc <9 0 - 34 IU/mL    Assessment & Plan      Problem List Items Addressed This Visit     Morbid (severe) obesity due to excess calories (HCC)    Elevated BMI with normal A1c, but elevation in insulin levels indicating underlying insulin resistance. I suspect that this is triggering the body to hold onto higher levels of fat. Her high insulin levels could be a factor in her menstrual concerns, but this is not clear. At this time I recommend following a low carbohydrate diet to help reduce the production of insulin. We will monitor labs today.       Iron deficiency anemia due to chronic blood loss    Heavy menses with chronic IDA requiring iron infusions for treatment. Recommend she continue to follow with hematology for treatment while we work to find possible causative factor for heavy bleeding. No alarm symptoms present today. Will monitor labs and make changes to the plan of care as necessary.       Relevant Orders   Fe+CBC/D/Plt+TIBC+Fer+Retic (Completed)   US Pelvic Complete With Transvaginal   Thyroid peroxidase antibody (Completed)   Rash and nonspecific skin eruption - Primary    Rash appears to be fungal in nature. Woods lamp shows florescent illumination in the fissure areas which is consistent with this process. I will send in nystatin and topical steroid to help with healing and elimination of fungal infection. If no improvement in 7-10 days recommend she reaches back out for further recommendations.        Relevant Medications   nystatin-triamcinolone (MYCOLOG II) cream   Other Relevant Orders   TSH (Completed)   T4, free (Completed)   Thyroid peroxidase antibody (Completed)   Menorrhagia with irregular cycle    Heavy menses with chronic IDA. It is unclear what has caused the ongoing heavy menses. Given the significance of her anemia, I feel that further evaluation is necessary to determine if there are any intrauterine causes that could be triggering this. We will also check hormone levels, as well. We will make further determination  based on labs .      Relevant Orders   US Pelvic Complete With Transvaginal   Thyroid peroxidase antibody (Completed)   Insulin resistance    Chronic insulin resistance with normal hemoglobin A1c. She does have a history of gestational diabetes, but recent screenings have been normal. We will monitor labs today for further evaluation. I suspect, based on this history, that she is trending towards prediabetes, but we will monitor closely and make changes to plan of care as necessary based on findings. I recommend a low carbohydrate diet and routine exercise to help reduce the insulin production.       Relevant Orders   Hemoglobin A1c (Completed)   17-Hydroxyprogesterone (Completed)   Testosterone (Completed)   Prolactin (Completed)   Thyroid peroxidase antibody (Completed)   Other Visit Diagnoses     Irregular menses       Relevant Orders   TestT+TestF+SHBG (Completed)   TSH (Completed)   T4, free (Completed)   17-Hydroxyprogesterone (Completed)   Testosterone (Completed)   Prolactin (Completed)   Thyroid peroxidase antibody (Completed)        Return for TBD.      Lilas Diefendorf, Coralee Pesa, NP, DNP, AGNP-C [facility]

## 2023-01-13 ENCOUNTER — Other Ambulatory Visit: Payer: Self-pay | Admitting: Hematology and Oncology

## 2023-01-13 ENCOUNTER — Inpatient Hospital Stay: Payer: 59 | Attending: Hematology and Oncology

## 2023-01-13 ENCOUNTER — Inpatient Hospital Stay (HOSPITAL_BASED_OUTPATIENT_CLINIC_OR_DEPARTMENT_OTHER): Payer: 59 | Admitting: Hematology and Oncology

## 2023-01-13 VITALS — BP 132/71 | HR 69 | Temp 97.8°F | Resp 16 | Wt 282.6 lb

## 2023-01-13 DIAGNOSIS — D75839 Thrombocytosis, unspecified: Secondary | ICD-10-CM | POA: Diagnosis not present

## 2023-01-13 DIAGNOSIS — D500 Iron deficiency anemia secondary to blood loss (chronic): Secondary | ICD-10-CM

## 2023-01-13 DIAGNOSIS — N920 Excessive and frequent menstruation with regular cycle: Secondary | ICD-10-CM | POA: Diagnosis not present

## 2023-01-13 DIAGNOSIS — D72828 Other elevated white blood cell count: Secondary | ICD-10-CM | POA: Diagnosis not present

## 2023-01-13 LAB — IRON AND IRON BINDING CAPACITY (CC-WL,HP ONLY)
Iron: 52 ug/dL (ref 28–170)
Saturation Ratios: 16 % (ref 10.4–31.8)
TIBC: 318 ug/dL (ref 250–450)
UIBC: 266 ug/dL (ref 148–442)

## 2023-01-13 LAB — RETIC PANEL
Immature Retic Fract: 24.8 % — ABNORMAL HIGH (ref 2.3–15.9)
RBC.: 4.63 MIL/uL (ref 3.87–5.11)
Retic Count, Absolute: 107.4 10*3/uL (ref 19.0–186.0)
Retic Ct Pct: 2.3 % (ref 0.4–3.1)
Reticulocyte Hemoglobin: 30.9 pg (ref >27.9)

## 2023-01-13 LAB — FE+CBC/D/PLT+TIBC+FER+RETIC
Basophils Absolute: 0.1 10*3/uL (ref 0.0–0.2)
Basos: 1 %
EOS (ABSOLUTE): 0.3 10*3/uL (ref 0.0–0.4)
Eos: 3 %
Ferritin: 136 ng/mL (ref 15–150)
Hematocrit: 38.8 % (ref 34.0–46.6)
Hemoglobin: 12.2 g/dL (ref 11.1–15.9)
Immature Grans (Abs): 0.1 10*3/uL (ref 0.0–0.1)
Immature Granulocytes: 1 %
Iron Saturation: 16 % (ref 15–55)
Iron: 46 ug/dL (ref 27–159)
Lymphocytes Absolute: 2.7 10*3/uL (ref 0.7–3.1)
Lymphs: 24 %
MCH: 26.1 pg — ABNORMAL LOW (ref 26.6–33.0)
MCHC: 31.4 g/dL — ABNORMAL LOW (ref 31.5–35.7)
MCV: 83 fL (ref 79–97)
Monocytes Absolute: 0.9 10*3/uL (ref 0.1–0.9)
Monocytes: 8 %
Neutrophils Absolute: 7.2 10*3/uL — ABNORMAL HIGH (ref 1.4–7.0)
Neutrophils: 63 %
Platelets: 371 10*3/uL (ref 150–450)
RBC: 4.67 x10E6/uL (ref 3.77–5.28)
RDW: 19.3 % — ABNORMAL HIGH (ref 11.7–15.4)
Retic Ct Pct: 2.1 % (ref 0.6–2.6)
Total Iron Binding Capacity: 289 ug/dL (ref 250–450)
UIBC: 243 ug/dL (ref 131–425)
WBC: 11.1 10*3/uL — ABNORMAL HIGH (ref 3.4–10.8)

## 2023-01-13 LAB — CMP (CANCER CENTER ONLY)
ALT: 19 U/L (ref 0–44)
AST: 17 U/L (ref 15–41)
Albumin: 3.9 g/dL (ref 3.5–5.0)
Alkaline Phosphatase: 73 U/L (ref 38–126)
Anion gap: 7 (ref 5–15)
BUN: 10 mg/dL (ref 6–20)
CO2: 27 mmol/L (ref 22–32)
Calcium: 9 mg/dL (ref 8.9–10.3)
Chloride: 108 mmol/L (ref 98–111)
Creatinine: 0.77 mg/dL (ref 0.44–1.00)
GFR, Estimated: >60 mL/min (ref >60)
Glucose, Bld: 85 mg/dL (ref 70–99)
Potassium: 4 mmol/L (ref 3.5–5.1)
Sodium: 142 mmol/L (ref 135–145)
Total Bilirubin: 0.6 mg/dL (ref 0.3–1.2)
Total Protein: 7.5 g/dL (ref 6.5–8.1)

## 2023-01-13 LAB — CBC WITH DIFFERENTIAL (CANCER CENTER ONLY)
Abs Immature Granulocytes: 0.07 10*3/uL (ref 0.00–0.07)
Basophils Absolute: 0.1 10*3/uL (ref 0.0–0.1)
Basophils Relative: 1 %
Eosinophils Absolute: 0.3 10*3/uL (ref 0.0–0.5)
Eosinophils Relative: 2 %
HCT: 39.1 % (ref 36.0–46.0)
Hemoglobin: 12.6 g/dL (ref 12.0–15.0)
Immature Granulocytes: 1 %
Lymphocytes Relative: 22 %
Lymphs Abs: 2.4 10*3/uL (ref 0.7–4.0)
MCH: 27.4 pg (ref 26.0–34.0)
MCHC: 32.2 g/dL (ref 30.0–36.0)
MCV: 85 fL (ref 80.0–100.0)
Monocytes Absolute: 0.7 10*3/uL (ref 0.1–1.0)
Monocytes Relative: 6 %
Neutro Abs: 7.7 10*3/uL (ref 1.7–7.7)
Neutrophils Relative %: 68 %
Platelet Count: 354 10*3/uL (ref 150–400)
RBC: 4.6 MIL/uL (ref 3.87–5.11)
RDW: 20.5 % — ABNORMAL HIGH (ref 11.5–15.5)
WBC Count: 11.2 10*3/uL — ABNORMAL HIGH (ref 4.0–10.5)
nRBC: 0 % (ref 0.0–0.2)

## 2023-01-13 LAB — FERRITIN: Ferritin: 79 ng/mL (ref 11–307)

## 2023-01-13 LAB — THYROID PEROXIDASE ANTIBODY: Thyroperoxidase Ab SerPl-aCnc: <9 IU/mL (ref 0–34)

## 2023-01-13 NOTE — Progress Notes (Signed)
[facility] [phone]   [phone]  PROGRESS NOTE  Patient Care Team: Early, Sung Amabile, NP as PCP - General (Nurse Practitioner)  Hematological/Oncological History # Iron Deficiency Anemia 2/2 GI Bleeding  10/01/2022: WBC 12.9, Hgb 10.3, MCV 78, Plt 463. Iron sat 4%, Ferritin 10 11/17/2022: establish care with Dr. Leonides Schanz  12/03/2022-12/30/2022: IV iron sucrose 200 mg x 5 doses.   Interval History:  Audrey [name] 37 y.o. female with medical history significant for iron deficiency anemia 2/2 GI bleeding who presents for a follow up visit. The patient's last visit was on 11/202/2023. In the interim since the last visit she received 5 doses of IV iron sucrose from 12/03/2022-12/30/2022.  On exam today Audrey [name] reports that she had a good holiday season.  She tolerated the 5 doses of IV iron well, however after her fourth though she noted a rash around her neck.  She has been seen by her primary care provider who provided her with a steroid cream.  She notes her energy is improved but she is still "tired".  She notes that her energy is currently about a 6 or 7 out of 10.  She notes that she has been eating well and is trying to eat more in the way of red meat.  She has had no unusual bleeding other than her regular menstrual cycles.  She notes her cycles do tend to have a little more blood clots.  Otherwise she denies any fevers, chills, sweats, nausea, vomiting or diarrhea.  A full 10 point ROS was otherwise negative.  MEDICAL HISTORY:  Past Medical History:  Diagnosis Date   Arthritis    Atypical chest pain 10/03/2020   Common migraine with intractable migraine 01/23/2017   Diabetes mellitus without complication (HCC)    Fetal demise 05/19/2013   GERD (gastroesophageal reflux disease)    with pregnancy   Gestational diabetes    Headache    migraines   Hemorrhage after delivery of fetus 08/12/2006   Morbidly obese (HCC) 01/23/2017   Preeclampsia    Pregnancy  induced hypertension    S/P cesarean section 05/20/2013   S/P cesarean section 11/18/2014    SURGICAL HISTORY: Past Surgical History:  Procedure Laterality Date   CESAREAN SECTION     CESAREAN SECTION N/A 05/19/2013   Procedure: CESAREAN SECTION;  Surgeon: Sherron Monday, MD;  Location: [facility];  Service: Obstetrics;  Laterality: N/A;   CESAREAN SECTION WITH BILATERAL TUBAL LIGATION Bilateral 11/17/2014   Procedure: CESAREAN SECTION WITH BILATERAL TUBAL LIGATION;  Surgeon: Sherian Rein, MD;  Location: [facility];  Service: Obstetrics;  Laterality: Bilateral;   TOENAIL EXCISION     WISDOM TOOTH EXTRACTION      SOCIAL HISTORY: Social History   Socioeconomic History   Marital status: Single    Spouse name: Not on file   Number of children: 1   Years of education: 12+   Highest education level: Not on file  Occupational History    Employer: [employer]  Tobacco Use   Smoking status: Never   Smokeless tobacco: Never  Vaping Use   Vaping Use: Never used  Substance and Sexual Activity   Alcohol use: No   Drug use: No   Sexual activity: Not Currently  Other Topics Concern   Not on file  Social History Narrative   Patient lives at home with daughter.    Patient has has 1 child and one on the way.    Patient has some college education.  Patient works at [employer].          Social Determinants of Radio broadcast assistant Strain: Not on file  Food Insecurity: Not on file  Transportation Needs: Not on file  Physical Activity: Not on file  Stress: Not on file  Social Connections: Not on file  Intimate Partner Violence: Not on file    FAMILY HISTORY: Family History  Adopted: Yes  Problem Relation Age of Onset   Diabetes Mother    Asthma Other    Hyperlipidemia Other    Obesity Other    Diabetes Maternal Grandmother    Hypertension Maternal Grandmother    Diabetes Paternal Grandmother    Hypertension Paternal Grandmother     ALLERGIES:  is allergic to  sumatriptan.  MEDICATIONS:  Current Outpatient Medications  Medication Sig Dispense Refill   doxycycline (VIBRA-TABS) 100 MG tablet Take 1 tab (100mg ) by mouth twice a day for abscess. (Patient not taking: Reported on 11/17/2022) 14 tablet 4   fluconazole (DIFLUCAN) 150 MG tablet Take one tablet by mouth at the first sign of symptoms of yeast. If no resolution, repeat dose in 72 hours. 2 tablet 2   aspirin-acetaminophen-caffeine (EXCEDRIN MIGRAINE) 250-250-65 MG tablet Take 2 tablets by mouth every 8 (eight) hours as needed for headache.  (Patient not taking: Reported on 01/12/2023)     dicyclomine (BENTYL) 10 MG capsule Take 1 capsule (10 mg total) by mouth 4 (four) times daily - before meals and at bedtime. 90 capsule 2   Ferric Maltol (ACCRUFER) 30 MG CAPS Take 1 tablet by mouth daily. 30 capsule 5   Fremanezumab-vfrm (AJOVY) 225 MG/1.5ML SOAJ Inject 225 mg into the skin every 30 (thirty) days. 1.5 mL 3   ibuprofen (ADVIL) 200 MG tablet Take 200 mg by mouth every 6 (six) hours as needed.     Magnesium 250 MG TABS Take 1 tablet (250 mg total) by mouth every evening. Take with evening meal (Patient not taking: Reported on 01/12/2023) 30 tablet 0   meclizine (ANTIVERT) 12.5 MG tablet Take 1 tablet (12.5 mg total) by mouth 3 (three) times daily as needed for dizziness. 30 tablet 0   nystatin powder Apply 1 application topically 3 (three) times daily. (Patient not taking: Reported on 10/01/2022) 15 g 0   nystatin-triamcinolone (MYCOLOG II) cream Apply 1 Application topically 2 (two) times daily. 60 g 1   ondansetron (ZOFRAN) 4 MG tablet Take 1 tablet (4 mg total) by mouth daily as needed for nausea or vomiting. (Patient not taking: Reported on 01/12/2023) 30 tablet 1   pantoprazole (PROTONIX) 20 MG tablet Take 1 tablet (20 mg total) by mouth daily. 90 tablet 1   Semaglutide-Weight Management (WEGOVY) 0.5 MG/0.5ML SOAJ Inject 0.5 mg into the skin once a week. (Patient not taking: Reported on 01/12/2023) 2  mL 0   triamcinolone (NASACORT) 55 MCG/ACT AERO nasal inhaler Place 1 spray into the nose 2 (two) times daily. 1 Inhaler 2   valACYclovir (VALTREX) 1000 MG tablet TAKE 2 TABLETS (2,000 MG TOTAL) BY MOUTH 2 (TWO) TIMES DAILY.     No current facility-administered medications for this visit.    REVIEW OF SYSTEMS:   Constitutional: ( - ) fevers, ( - )  chills , ( - ) night sweats Eyes: ( - ) blurriness of vision, ( - ) double vision, ( - ) watery eyes Ears, nose, mouth, throat, and face: ( - ) mucositis, ( - ) sore throat Respiratory: ( - ) cough, ( - )  dyspnea, ( - ) wheezes Cardiovascular: ( - ) palpitation, ( - ) chest discomfort, ( - ) lower extremity swelling Gastrointestinal:  ( - ) nausea, ( - ) heartburn, ( - ) change in bowel habits Skin: ( - ) abnormal skin rashes Lymphatics: ( - ) new lymphadenopathy, ( - ) easy bruising Neurological: ( - ) numbness, ( - ) tingling, ( - ) new weaknesses Behavioral/Psych: ( - ) mood change, ( - ) new changes  All other systems were reviewed with the patient and are negative.  PHYSICAL EXAMINATION:  Vitals:   01/13/23 1017  BP: 132/71  Pulse: 69  Resp: 16  Temp: 97.8 F (36.6 C)  SpO2: 100%   Filed Weights   01/13/23 1017  Weight: 282 lb 9.6 oz (128.2 kg)    GENERAL: Well-appearing middle-aged African-American female, alert, no distress and comfortable SKIN: skin color, texture, turgor are normal, no rashes or significant lesions EYES: conjunctiva are pink and non-injected, sclera clear LUNGS: clear to auscultation and percussion with normal breathing effort HEART: regular rate & rhythm and no murmurs and no lower extremity edema Musculoskeletal: no cyanosis of digits and no clubbing  PSYCH: alert & oriented x 3, fluent speech NEURO: no focal motor/sensory deficits  LABORATORY DATA:  I have reviewed the data as listed    Latest Ref Rng & Units 01/13/2023    9:55 AM 01/12/2023    2:56 PM 11/17/2022    2:40 PM  CBC  WBC 4.0 -  10.5 K/uL 11.2  11.1  14.1   Hemoglobin 12.0 - 15.0 g/dL 83.4  19.6  22.2   Hematocrit 36.0 - 46.0 % 39.1  38.8  36.2   Platelets 150 - 400 K/uL 354  371  429        Latest Ref Rng & Units 11/17/2022    2:40 PM 10/01/2022    4:22 PM 09/25/2021    3:14 PM  CMP  Glucose 70 - 99 mg/dL 83  79  86   BUN 6 - 20 mg/dL 11  13  12    Creatinine 0.44 - 1.00 mg/dL  9.79  8.92   Sodium 135 - 145 mmol/L 140  139  140   Potassium 3.5 - 5.1 mmol/L 3.9  4.4  3.9   Chloride 98 - 111 mmol/L 105  102  105   CO2 22 - 32 mmol/L 28  23  25    Calcium 8.9 - 10.3 mg/dL 9.2  9.0  9.2   Total Protein 6.5 - 8.1 g/dL 8.3  7.3  7.5   Total Bilirubin 0.3 - 1.2 mg/dL 0.7  0.5  0.6   Alkaline Phos 38 - 126 U/L 78  86    AST 15 - 41 U/L 18  20  18    ALT 0 - 44 U/L 19  17  15      RADIOGRAPHIC STUDIES: No results found.  ASSESSMENT & PLAN Audrey [name] 37 y.o. female with medical history significant for iron deficiency anemia 2/2 GI bleeding who presents for a follow up visit.  After review of the labs, review of the records, and discussion with the patient the patients findings are most consistent with iron deficiency anemia secondary to GYN bleeding.   # Iron Deficiency Anemia 2/2 to GYN Bleeding -- Findings are consistent with iron deficiency anemia secondary to patient's menorrhagia --Encouraged her to follow-up with OB/GYN for better control of her menstrual cycles --Today we will repeat iron panel and ferritin as  well as reticulocytes, CBC, and CMP --Continue ferrous sulfate 325 mg daily with a source of vitamin C --We will plan to proceed with IV iron therapy if her iron levels are low again.  --Labs today show white blood cell count 11.2, hemoglobin 12.6, MCV 85, and platelets of 354. --Plan for return to clinic in 6 months time to reevaluate.   # Leukocytosis, Neutrophilic Predominant -- Suspect this is secondary to her hidradenitis suppurativa. -- Continue to monitor.    No orders of the  defined types were placed in this encounter.   All questions were answered. The patient knows to call the clinic with any problems, questions or concerns.  A total of more than 30 minutes were spent on this encounter with face-to-face time and non-face-to-face time, including preparing to see the patient, ordering tests and/or medications, counseling the patient and coordination of care as outlined above.   Ledell Peoples, MD Department of Hematology/Oncology [place] [facility] Phone: [phone] Pager: [phone] Email: [email or web address]  01/13/2023 10:38 AM

## 2023-01-17 DIAGNOSIS — R21 Rash and other nonspecific skin eruption: Secondary | ICD-10-CM

## 2023-01-17 DIAGNOSIS — N921 Excessive and frequent menstruation with irregular cycle: Secondary | ICD-10-CM | POA: Insufficient documentation

## 2023-01-17 DIAGNOSIS — E88819 Insulin resistance, unspecified: Secondary | ICD-10-CM | POA: Insufficient documentation

## 2023-01-17 HISTORY — DX: Rash and other nonspecific skin eruption: R21

## 2023-01-17 NOTE — Assessment & Plan Note (Signed)
Heavy menses with chronic IDA requiring iron infusions for treatment. Recommend she continue to follow with hematology for treatment while we work to find possible causative factor for heavy bleeding. No alarm symptoms present today. Will monitor labs and make changes to the plan of care as necessary.

## 2023-01-17 NOTE — Assessment & Plan Note (Signed)
Heavy menses with chronic IDA. It is unclear what has caused the ongoing heavy menses. Given the significance of her anemia, I feel that further evaluation is necessary to determine if there are any intrauterine causes that could be triggering this. We will also check hormone levels, as well. We will make further determination based on labs .

## 2023-01-17 NOTE — Assessment & Plan Note (Signed)
Elevated BMI with normal A1c, but elevation in insulin levels indicating underlying insulin resistance. I suspect that this is triggering the body to hold onto higher levels of fat. Her high insulin levels could be a factor in her menstrual concerns, but this is not clear. At this time I recommend following a low carbohydrate diet to help reduce the production of insulin. We will monitor labs today.

## 2023-01-17 NOTE — Assessment & Plan Note (Addendum)
Chronic insulin resistance with normal hemoglobin A1c. She does have a history of gestational diabetes, but recent screenings have been normal. We will monitor labs today for further evaluation. I suspect, based on this history, that she is trending towards prediabetes, but we will monitor closely and make changes to plan of care as necessary based on findings. I recommend a low carbohydrate diet and routine exercise to help reduce the insulin production.

## 2023-01-17 NOTE — Assessment & Plan Note (Signed)
Rash appears to be fungal in nature. Woods lamp shows florescent illumination in the fissure areas which is consistent with this process. I will send in nystatin and topical steroid to help with healing and elimination of fungal infection. If no improvement in 7-10 days recommend she reaches back out for further recommendations.

## 2023-01-18 ENCOUNTER — Encounter: Payer: Self-pay | Admitting: Nurse Practitioner

## 2023-01-22 LAB — HEMOGLOBIN A1C
Est. average glucose Bld gHb Est-mCnc: 91 mg/dL
Hgb A1c MFr Bld: 4.8 % (ref 4.8–5.6)

## 2023-01-22 LAB — PROLACTIN: Prolactin: 12.3 ng/mL (ref 4.8–33.4)

## 2023-01-22 LAB — TESTT+TESTF+SHBG
Sex Hormone Binding: 48.3 nmol/L (ref 24.6–122.0)
Testosterone, Free: 0.7 pg/mL (ref 0.0–4.2)
Testosterone, Total, LC/MS: 23.9 ng/dL (ref 10.0–55.0)

## 2023-01-22 LAB — T4, FREE: Free T4: 1.01 ng/dL (ref 0.82–1.77)

## 2023-01-22 LAB — 17-HYDROXYPROGESTERONE: 17-OH Progesterone LCMS: <10 ng/dL

## 2023-01-22 LAB — TESTOSTERONE: Testosterone: 6 ng/dL — ABNORMAL LOW (ref 8–60)

## 2023-01-22 LAB — TSH: TSH: 1.96 u[IU]/mL (ref 0.450–4.500)

## 2023-01-28 ENCOUNTER — Other Ambulatory Visit (HOSPITAL_COMMUNITY): Payer: Self-pay

## 2023-02-02 ENCOUNTER — Ambulatory Visit
Admission: RE | Admit: 2023-02-02 | Discharge: 2023-02-02 | Disposition: A | Discharge status: Home / Self Care | Payer: 59 | Source: Ambulatory Visit | Attending: Nurse Practitioner | Admitting: Nurse Practitioner

## 2023-02-02 DIAGNOSIS — N920 Excessive and frequent menstruation with regular cycle: Secondary | ICD-10-CM | POA: Diagnosis not present

## 2023-02-02 DIAGNOSIS — D500 Iron deficiency anemia secondary to blood loss (chronic): Secondary | ICD-10-CM

## 2023-02-02 DIAGNOSIS — N921 Excessive and frequent menstruation with irregular cycle: Secondary | ICD-10-CM

## 2023-02-13 ENCOUNTER — Telehealth (INDEPENDENT_AMBULATORY_CARE_PROVIDER_SITE_OTHER): Payer: 59 | Admitting: Nurse Practitioner

## 2023-02-13 ENCOUNTER — Other Ambulatory Visit: Payer: Self-pay

## 2023-02-13 ENCOUNTER — Encounter: Payer: Self-pay | Admitting: Nurse Practitioner

## 2023-02-13 ENCOUNTER — Other Ambulatory Visit (HOSPITAL_COMMUNITY): Payer: Self-pay

## 2023-02-13 VITALS — Ht 68.0 in | Wt 279.0 lb

## 2023-02-13 DIAGNOSIS — J3489 Other specified disorders of nose and nasal sinuses: Secondary | ICD-10-CM

## 2023-02-13 DIAGNOSIS — K2960 Other gastritis without bleeding: Secondary | ICD-10-CM

## 2023-02-13 DIAGNOSIS — Z6841 Body Mass Index (BMI) 40.0 and over, adult: Secondary | ICD-10-CM

## 2023-02-13 DIAGNOSIS — N921 Excessive and frequent menstruation with irregular cycle: Secondary | ICD-10-CM

## 2023-02-13 HISTORY — DX: Other specified disorders of nose and nasal sinuses: J34.89

## 2023-02-13 MED ORDER — AZELASTINE HCL 0.1 % NA SOLN
2.0000 | Freq: Two times a day (BID) | NASAL | 12 refills | Status: DC
  Filled 2023-02-13: qty 30, 25d supply, fill #0

## 2023-02-13 MED ORDER — WEGOVY 0.25 MG/0.5ML ~~LOC~~ SOAJ
0.2500 mg | SUBCUTANEOUS | 0 refills | Status: DC
  Filled 2023-02-13: qty 2, 28d supply, fill #0

## 2023-02-13 MED ORDER — AZITHROMYCIN 250 MG PO TABS
ORAL_TABLET | ORAL | 0 refills | Status: AC
  Filled 2023-02-13: qty 6, 5d supply, fill #0

## 2023-02-13 MED ORDER — PANTOPRAZOLE SODIUM 40 MG PO TBEC
40.0000 mg | DELAYED_RELEASE_TABLET | Freq: Two times a day (BID) | ORAL | 3 refills | Status: DC
  Filled 2023-02-13: qty 60, 30d supply, fill #0
  Filled 2023-03-10: qty 60, 30d supply, fill #1

## 2023-02-13 MED ORDER — WEGOVY 0.5 MG/0.5ML ~~LOC~~ SOAJ
0.5000 mg | SUBCUTANEOUS | 0 refills | Status: DC
  Filled 2023-02-13: qty 2, fill #0

## 2023-02-13 NOTE — Assessment & Plan Note (Addendum)
Patient presents with heavy menstrual bleeding that has been present for more than 16 years. Long standing history of symptomatic anemia and required iron infusions to help negate this. She is not planning to have more children and has had a tubal ligation. Bleeding appears to be worsening. We discussed the option of high dose OCP to help stop the bleeding at this time, but she is concerned about the side effects of this. She would like to see how she does for the next day or so, but will let me know if the bleeding worsens or fails to stop. Recent ultrasound showed no structural abnormalities contributing to heavy bleeding.  Plan: - Refer the patient to Dr. Sabra Heck for a gynecological evaluation and to discuss potential interventions such as endometrial ablation or hysterectomy. - Consider prescribing high-dose birth control to temporarily stop or slow down the bleeding if this does not improve.  - Order coagulation tests to screen for bleeding disorders. - Consider additional testing for other relevant hormone levels, including FSH, LH, cortisol, DHEA, and androstenedione to help determine cause.. - Repeat CBC.  - Patient will come in Monday for labs.

## 2023-02-13 NOTE — Progress Notes (Unsigned)
Virtual Visit Encounter mychart visit.   I connected with  Audrey [name] on 02/13/23 at  8:15 AM EST by secure video and audio telemedicine application. I verified that I am speaking with the correct person using two identifiers.   I introduced myself as a Designer, jewellery with the practice. The limitations of evaluation and management by telemedicine discussed with the patient and the availability of in person appointments. The patient expressed verbal understanding and consent to proceed.  Participating parties in this visit include: Myself and patient  The patient is: Patient Location: Other:  work I am: Provider Location: Office/Clinic Subjective:    CC and HPI: Audrey [name] is a 37 y.o. year old female presenting for new evaluation and treatment of cough, mucous, bleeing. Patient reports the following: Audrey [name] presents today with concerns regarding her respiratory symptoms and heavy menstrual bleeding. She reports experiencing congestion in the morning with mucous present. When she lays down at night she reports exacerbation of cough and mucous, she is not sure if this is related to post-nasal drip or GERD. She does note occasional shortness of breath, which she speculates may be related to reflux. She also feels that there may be a component of infection going on given her symptoms, she feels like she has been getting sick.   Her medical history is significant for heavy menstrual bleeding with some abnormal hormone levels noted on previous labs. She has not had further work-up completed for hormonal issues or bleeding disorders aside from testosterone and 17OHP. Currently, she is undergoing a very heavy menstrual period soaking through a super tampon and pad about every hour. She tells me usually she just needs to change her tampon about every hour for the first 3 days, but this period is heavier and concerning. Her last CBC and iron studies were completed right after finishing 4 courses  of iron infusions, so these may not reflect her current levels.   [name]'s current medication includes Pantoprazole 20 mg for GERD, she does not feel that this dose is helpful for her symptoms She is open to the addition of Azelastine nasal spray or Xyzal for relief from her congestion and post-nasal drip, she prefers a nasal spray. Furthermore, Audrey [name] is considering gynecological procedures, such as laparoscopic hysterectomy or ablation, to address her concerns with heavy menstrual bleeding. She is open to GYN referral for this.    Past medical history, Surgical history, Family history not pertinant except as noted below, Social history, Allergies, and medications have been entered into the medical record, reviewed, and corrections made.   Review of Systems:  All review of systems negative except what is listed in the HPI  Objective:    Alert and oriented x 4 Speaking in clear sentences with no shortness of breath. No distress.  Impression and Recommendations:    Problem List Items Addressed This Visit     Class 3 severe obesity with body mass index (BMI) of 40.0 to 44.9 in adult [facility])    Currently not on any medication, but has been approved for [facility]. She is interested in starting this to help with weight management. This may help with some of her concerns, specifically reflux.  Plan: - send wegovy 0.469m and 0.512mdoses to pharmacy - patient will let me know if she would like to stay on the 0.69m469mose or increase to 1mg12mse when she is close to completion - patient aware of national backorder and that medication may take some time to get.  Relevant Medications   Semaglutide-Weight Management (WEGOVY) 0.25 MG/0.5ML SOAJ   Semaglutide-Weight Management (WEGOVY) 0.5 MG/0.5ML SOAJ   Menorrhagia with irregular cycle    Patient presents with heavy menstrual bleeding that has been present for more than 16 years. Long standing history of symptomatic anemia and required iron  infusions to help negate this. She is not planning to have more children and has had a tubal ligation. Bleeding appears to be worsening. We discussed the option of high dose OCP to help stop the bleeding at this time, but she is concerned about the side effects of this. She would like to see how she does for the next day or so, but will let me know if the bleeding worsens or fails to stop. Recent ultrasound showed no structural abnormalities contributing to heavy bleeding.  Plan: - Refer the patient to Dr. Sabra Heck for a gynecological evaluation and to discuss potential interventions such as endometrial ablation or hysterectomy. - Consider prescribing high-dose birth control to temporarily stop or slow down the bleeding if this does not improve.  - Order coagulation tests to screen for bleeding disorders, including Von Willebrand's disease. - Consider additional testing for 17-hydroxylase deficiency syndrome and other relevant hormone levels, including FSH, LH, cortisol, DHEA, and androstenedione. - Repeat CBC.  - Patient will come in Monday for labs.       Relevant Orders   Ambulatory referral to Obstetrics / Gynecology   Reflux gastritis - Primary    Cough with increased mucous production worse when laying down at night. She does have increased issues with reflux lately, therefore reflux is considered a possible contributing factor.  Plan: - Increase Pantoprazole to 40 mg twice daily for two weeks, then taper to 40 mg once daily. - Prescribe azelastine for post-nasal drip, which may help reduce the cough and irritation to the throat - Follow-up if symptoms worsen or fail to improve.       Relevant Medications   pantoprazole (PROTONIX) 40 MG tablet   Posterior rhinorrhea    Rhinorrhea with posterior nasal drip and generalized feeling of unwell for the last several days. No fevers at this time. Suspect there is a component of sinusitis present given the length of time she has had symptoms.   Plan: - will send azithromycin for patient to start over the weekend - follow-up if symptoms worsen or fail to improve.        Relevant Medications   azelastine (ASTELIN) 0.1 % nasal spray   azithromycin (ZITHROMAX) 250 MG tablet    orders and follow up as documented in EMR I discussed the assessment and treatment plan with the patient. The patient was provided an opportunity to ask questions and all were answered. The patient agreed with the plan and demonstrated an understanding of the instructions.   The patient was advised to call back or seek an in-person evaluation if the symptoms worsen or if the condition fails to improve as anticipated.  Follow-Up: Monday for labs only  I provided 33 minutes of non-face-to-face interaction with this non face-to-face encounter including intake, same-day documentation, and chart review.   Orma Render, NP , DNP, AGNP-c [facility]

## 2023-02-13 NOTE — Assessment & Plan Note (Signed)
Rhinorrhea with posterior nasal drip and generalized feeling of unwell for the last several days. No fevers at this time. Suspect there is a component of sinusitis present given the length of time she has had symptoms.  Plan: - will send azithromycin for patient to start over the weekend - follow-up if symptoms worsen or fail to improve.

## 2023-02-13 NOTE — Assessment & Plan Note (Signed)
Currently not on any medication, but has been approved for [facility]. She is interested in starting this to help with weight management. This may help with some of her concerns, specifically reflux.  Plan: - send wegovy 0.60m and 0.529mdoses to pharmacy - patient will let me know if she would like to stay on the 0.14m71mose or increase to 1mg46mse when she is close to completion - patient aware of national backorder and that medication may take some time to get.

## 2023-02-13 NOTE — Assessment & Plan Note (Signed)
Cough with increased mucous production worse when laying down at night. She does have increased issues with reflux lately, therefore reflux is considered a possible contributing factor.  Plan: - Increase Pantoprazole to 40 mg twice daily for two weeks, then taper to 40 mg once daily. - Prescribe azelastine for post-nasal drip, which may help reduce the cough and irritation to the throat - Follow-up if symptoms worsen or fail to improve.

## 2023-02-16 ENCOUNTER — Other Ambulatory Visit: Payer: 59

## 2023-02-16 ENCOUNTER — Telehealth: Payer: Self-pay | Admitting: Nurse Practitioner

## 2023-02-16 NOTE — Telephone Encounter (Signed)
Pt wants to come in tomorrow for lab work ya'll discussed? I didn't see any orders will this be ok?

## 2023-02-17 ENCOUNTER — Other Ambulatory Visit: Payer: 59

## 2023-02-17 ENCOUNTER — Other Ambulatory Visit: Payer: Self-pay | Admitting: Nurse Practitioner

## 2023-02-17 DIAGNOSIS — E88819 Insulin resistance, unspecified: Secondary | ICD-10-CM

## 2023-02-26 ENCOUNTER — Other Ambulatory Visit: Payer: 59

## 2023-03-02 ENCOUNTER — Other Ambulatory Visit: Payer: 59

## 2023-03-04 ENCOUNTER — Encounter: Payer: Self-pay | Admitting: Nurse Practitioner

## 2023-03-04 DIAGNOSIS — R7309 Other abnormal glucose: Secondary | ICD-10-CM

## 2023-03-04 DIAGNOSIS — E161 Other hypoglycemia: Secondary | ICD-10-CM

## 2023-03-04 DIAGNOSIS — E88819 Insulin resistance, unspecified: Secondary | ICD-10-CM

## 2023-03-10 ENCOUNTER — Encounter: Payer: Self-pay | Admitting: Hematology and Oncology

## 2023-03-10 ENCOUNTER — Other Ambulatory Visit (HOSPITAL_COMMUNITY): Payer: Self-pay

## 2023-03-11 LAB — FIBRINOGEN: Fibrinogen: 370 mg/dL (ref 193–507)

## 2023-03-11 LAB — INSULIN ANTIBODIES, BLOOD: Insulin AutoAb: <5 uU/mL

## 2023-03-11 LAB — INSULIN, FREE AND TOTAL
Free Insulin: 68 uU/mL — ABNORMAL HIGH
Total Insulin: 68 uU/mL

## 2023-03-11 LAB — PLASMINOGEN ACTIVATOR INHIBITOR 1(PAI-1) 4G/5G POLYMORPHISM

## 2023-03-11 LAB — LIPOPROTEIN A (LPA): Lipoprotein (a): 62.3 nmol/L (ref <75.0)

## 2023-03-13 ENCOUNTER — Encounter: Payer: Self-pay | Admitting: Hematology and Oncology

## 2023-03-20 ENCOUNTER — Other Ambulatory Visit (HOSPITAL_COMMUNITY): Payer: Self-pay

## 2023-03-20 MED ORDER — WEGOVY 0.5 MG/0.5ML ~~LOC~~ SOAJ
0.5000 mg | SUBCUTANEOUS | 0 refills | Status: DC
  Filled 2023-03-20: qty 2, 28d supply, fill #0

## 2023-03-20 NOTE — Addendum Note (Signed)
Addended by: Teala Daffron, Clarise Cruz E on: 03/20/2023 06:23 PM   Modules accepted: Orders

## 2023-03-23 ENCOUNTER — Other Ambulatory Visit (HOSPITAL_COMMUNITY): Payer: Self-pay

## 2023-03-23 ENCOUNTER — Encounter: Payer: Self-pay | Admitting: Hematology and Oncology

## 2023-03-24 ENCOUNTER — Other Ambulatory Visit (HOSPITAL_COMMUNITY): Payer: Self-pay

## 2023-03-24 ENCOUNTER — Encounter: Payer: Self-pay | Admitting: Nurse Practitioner

## 2023-03-24 ENCOUNTER — Encounter: Payer: Self-pay | Admitting: Hematology and Oncology

## 2023-03-24 ENCOUNTER — Telehealth (INDEPENDENT_AMBULATORY_CARE_PROVIDER_SITE_OTHER): Payer: 59 | Admitting: Nurse Practitioner

## 2023-03-24 VITALS — Wt 279.0 lb

## 2023-03-24 DIAGNOSIS — L210 Seborrhea capitis: Secondary | ICD-10-CM

## 2023-03-24 DIAGNOSIS — R052 Subacute cough: Secondary | ICD-10-CM

## 2023-03-24 DIAGNOSIS — L700 Acne vulgaris: Secondary | ICD-10-CM | POA: Insufficient documentation

## 2023-03-24 HISTORY — DX: Subacute cough: R05.2

## 2023-03-24 HISTORY — DX: Seborrhea capitis: L21.0

## 2023-03-24 MED ORDER — KETOCONAZOLE 2 % EX SHAM
1.0000 | MEDICATED_SHAMPOO | CUTANEOUS | 5 refills | Status: DC
  Filled 2023-03-24: qty 120, 25d supply, fill #0
  Filled 2023-06-12: qty 120, 25d supply, fill #1

## 2023-03-24 MED ORDER — AZITHROMYCIN 250 MG PO TABS
ORAL_TABLET | ORAL | 0 refills | Status: AC
  Filled 2023-03-24: qty 6, 5d supply, fill #0

## 2023-03-24 MED ORDER — LEVOCETIRIZINE DIHYDROCHLORIDE 5 MG PO TABS
5.0000 mg | ORAL_TABLET | Freq: Every evening | ORAL | 3 refills | Status: AC
  Filled 2023-03-24: qty 90, 90d supply, fill #0
  Filled 2023-07-24: qty 90, 90d supply, fill #1

## 2023-03-24 NOTE — Assessment & Plan Note (Signed)
Marijah continues to experience chest congestion and cough.  Her symptoms have been ongoing on and off for a little over a year now with episodes of exacerbation requiring antibiotic treatment.  She has had success for management with the use of azithromycin.  She is currently utilizing nasal spray to help with chronic postnasal drip however she is not using oral allergy medication.  We discussed that given that both of her children have a history of allergies and the symptoms that she is experiencing it is likely that she is also experiencing the same.  At this time it is not clear if her elevation in white counts is related to the cough however the timing is consistent. Plan: I have sent in [place] for you.  I do recommend taking this medication at bedtime as it can make you sleepy.  If you do not feel your symptoms are well-controlled with this medication please let me know and we can try alternative therapies.  Continue to use your nasal spray as together these can be helpful for your symptoms. I have also sent in a Z-Pak for you to use for treatment of suspected infection given the length of time the cough has been present.  Please start this right away and let me know if your symptoms worsen or fail to improve. If your symptoms do not improve with the use of the allergy medicine I do suggest we get a chest x-ray for further evaluation.

## 2023-03-24 NOTE — Assessment & Plan Note (Signed)
Palynn has previously received a prescription for nizoral 2% for chronic dandruff and skin issues from dermatology. She is requesting I continue this medication today.  Plan: - refills provided.

## 2023-03-24 NOTE — Progress Notes (Signed)
Virtual Visit Encounter mychart visit.   I connected with  Audrey [name] on 03/24/23 at  8:15 AM EDT by secure video and audio telemedicine application. I verified that I am speaking with the correct person using two identifiers.   I introduced myself as a Designer, jewellery with the practice. The limitations of evaluation and management by telemedicine discussed with the patient and the availability of in person appointments. The patient expressed verbal understanding and consent to proceed.  Participating parties in this visit include: Myself and patient  The patient is: Patient Location: Home I am: Provider Location: Office/Clinic Subjective:    CC and HPI: Audrey [name] is a 37 y.o. year old female presenting for follow up of cough.  Audrey [name] presents today with multiple health concerns and lifestyle considerations.   She is particularly focused on her weight management, reporting a regular exercise routine of four to five times a week in the mornings. Additionally, she is considering dietary changes, specifically planning to eliminate caffeinated and sugary drinks from her diet.  She mentions that her last pap smear was conducted a long time ago and opts out of having one today due to being at the tail end of her period.   As the patient approaches 37 years old, she expresses concern about her risk for colon cancer, citing a family history with her sister being a survivor. She requests a referral for a colonoscopy. The importance of a mammogram is acknowledged by the patient, who also emphasizes the need for self-care.   The patient has a history of thyroid issues and is on levothyroxine, which she takes every morning, ensuring to wait at least an hour before eating. Notable changes in her nails have been observed by the patient, which she believes may be related to her thyroid condition. However, she denies any brittleness or easy breakage of her nails.   Past medical history, Surgical  history, Family history not pertinant except as noted below, Social history, Allergies, and medications have been entered into the medical record, reviewed, and corrections made.   Review of Systems:  All review of systems negative except what is listed in the HPI  Objective:    Alert and oriented x 4 Speaking in clear sentences with no shortness of breath. No distress.  Impression and Recommendations:    Problem List Items Addressed This Visit     Subacute cough - Primary    Audrey [name] continues to experience chest congestion and cough.  Her symptoms have been ongoing on and off for a little over a year now with episodes of exacerbation requiring antibiotic treatment.  She has had success for management with the use of azithromycin.  She is currently utilizing nasal spray to help with chronic postnasal drip however she is not using oral allergy medication.  We discussed that given that both of her children have a history of allergies and the symptoms that she is experiencing it is likely that she is also experiencing the same.  At this time it is not clear if her elevation in white counts is related to the cough however the timing is consistent. Plan: I have sent in [place] for you.  I do recommend taking this medication at bedtime as it can make you sleepy.  If you do not feel your symptoms are well-controlled with this medication please let me know and we can try alternative therapies.  Continue to use your nasal spray as together these can be helpful for your symptoms. I  have also sent in a Z-Pak for you to use for treatment of suspected infection given the length of time the cough has been present.  Please start this right away and let me know if your symptoms worsen or fail to improve. If your symptoms do not improve with the use of the allergy medicine I do suggest we get a chest x-ray for further evaluation.      Relevant Medications   levocetirizine (XYZAL) 5 MG tablet   azithromycin  (ZITHROMAX) 250 MG tablet   Dandruff    Audrey [name] has previously received a prescription for nizoral 2% for chronic dandruff and skin issues from dermatology. She is requesting I continue this medication today.  Plan: - refills provided.       Relevant Medications   ketoconazole (NIZORAL) 2 % shampoo (Start on 03/26/2023)    orders and follow up as documented in EMR I discussed the assessment and treatment plan with the patient. The patient was provided an opportunity to ask questions and all were answered. The patient agreed with the plan and demonstrated an understanding of the instructions.   The patient was advised to call back or seek an in-person evaluation if the symptoms worsen or if the condition fails to improve as anticipated.  Follow-Up: prn  I provided 18 minutes of non-face-to-face interaction with this non face-to-face encounter including intake, same-day documentation, and chart review.   Audrey Render, NP , DNP, AGNP-c [facility]

## 2023-04-06 ENCOUNTER — Encounter: Payer: Self-pay | Admitting: Physician Assistant

## 2023-04-06 ENCOUNTER — Other Ambulatory Visit (INDEPENDENT_AMBULATORY_CARE_PROVIDER_SITE_OTHER): Payer: 59

## 2023-04-06 ENCOUNTER — Ambulatory Visit: Payer: 59 | Admitting: Physician Assistant

## 2023-04-06 DIAGNOSIS — M1711 Unilateral primary osteoarthritis, right knee: Secondary | ICD-10-CM | POA: Diagnosis not present

## 2023-04-06 DIAGNOSIS — M1712 Unilateral primary osteoarthritis, left knee: Secondary | ICD-10-CM

## 2023-04-06 DIAGNOSIS — M170 Bilateral primary osteoarthritis of knee: Secondary | ICD-10-CM | POA: Diagnosis not present

## 2023-04-06 NOTE — Progress Notes (Signed)
Office Visit Note   Patient: Audrey [name]           Date of Birth: 1986-01-30           MRN: [id number] Visit Date: 04/06/2023              Requested by: Tollie Eth, NP [address] [place],  [place] [address] PCP: Tollie Eth, NP   Assessment & Plan: Visit Diagnoses:  1. Unilateral primary osteoarthritis, left knee   2. Unilateral primary osteoarthritis, right knee   3. Primary osteoarthritis of both knees     Plan: 37 year old woman who is a patient of Dr. Hoy Register.  She has a history of degenerative changes of both of her right knee and now has similar symptoms on the left.  2 years ago she had viscosupplementation on the right and has had very good results.  She just recently had the pain returned but had no injury.  We discussed doing some physical therapy for some patellofemoral strengthening she is willing to do this.  Also will place an order for viscosupplementation into both of her knees. This patient is diagnosed with osteoarthritis of the knee(s).    Radiographs show evidence of joint space narrowing, osteophytes, subchondral sclerosis and/or subchondral cysts.  This patient has knee pain which interferes with functional and activities of daily living.    This patient has experienced inadequate response, adverse effects and/or intolerance with conservative treatments such as acetaminophen, NSAIDS, topical creams, physical therapy or regular exercise, knee bracing and/or weight loss.   This patient has experienced inadequate response or has a contraindication to intra articular steroid injections for at least 3 months.   This patient is not scheduled to have a total knee replacement within 6 months of starting treatment with viscosupplementation.   Follow-Up Instructions: No follow-ups on file.   Orders:  Orders Placed This Encounter  Procedures   XR KNEE 3 VIEW LEFT   XR KNEE 3 VIEW RIGHT   Ambulatory referral to Physical Therapy   No orders of the  defined types were placed in this encounter.     Procedures: No procedures performed   Clinical Data: No additional findings.   Subjective: Chief Complaint  Patient presents with   Right Knee - Pain   Left Knee - Pain    HPI Patient is a pleasant 37 year old woman who presents with a history of bilateral knee pain.  States that both of her knees are giving out on her.  She has gotten cortisone viscosupplementation on the right knee in 2022.  Hurts especially when she is going up the stairs.  She feels tightness and swelling when she is bending at times.  She is taking ibuprofen but does not find it very helpful Review of Systems  All other systems reviewed and are negative.    Objective: Vital Signs: There were no vitals taken for this visit.  Physical Exam Constitutional:      Appearance: Normal appearance.  Pulmonary:     Effort: Pulmonary effort is normal.  Skin:    General: Skin is warm and dry.  Neurological:     General: No focal deficit present.     Mental Status: She is alert and oriented to person, place, and time.     Ortho Exam examination of her bilateral knees no effusion no redness no erythema.  She is neurovascularly intact strength is intact she is got good varus valgus stability.  She has a lot of grinding  over the patellofemoral joints with range of motion some mild tenderness to palpation with medial and lateral palpation of the joint lines good strength with extension Specialty Comments:  No specialty comments available.  Imaging: XR KNEE 3 VIEW LEFT  Result Date: 04/06/2023 Radiographs of her knee were obtained.  Well-maintained alignment no evidence of any fracture or acute injury she does have some sclerotic changes and joint space narrowing especially in the patellofemoral joint  XR KNEE 3 VIEW RIGHT  Result Date: 04/06/2023 3 views of her right knee demonstrate some degenerative changes in the patellofemoral joint overall fairly good  preservation of the joint spacing with some slight sclerotic changes    PMFS History: Patient Active Problem List   Diagnosis Date Noted   Subacute cough 03/24/2023   Dandruff 03/24/2023   Acne vulgaris 03/24/2023   Posterior rhinorrhea 02/13/2023   Rash and nonspecific skin eruption 01/17/2023   Menorrhagia with irregular cycle 01/17/2023   Insulin resistance 01/17/2023   Iron deficiency anemia due to chronic blood loss 11/17/2022   Osteoarthritis of knees, bilateral 07/13/2020   Class 3 severe obesity with body mass index (BMI) of 40.0 to 44.9 in adult 01/27/2019   Bilateral knee pain 01/27/2019   Breast lump on left side at 3 o'clock position 01/27/2019   Reflux gastritis 01/26/2019   Migraine 07/12/2014   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   Past Medical History:  Diagnosis Date   Arthritis    Atypical chest pain 10/03/2020   Common migraine with intractable migraine 01/23/2017   Diabetes mellitus without complication    Fetal demise 05/19/2013   GERD (gastroesophageal reflux disease)    with pregnancy   Gestational diabetes    Headache    migraines   Hemorrhage after delivery of fetus 08/12/2006   Morbidly obese 01/23/2017   Preeclampsia    Pregnancy induced hypertension    S/P cesarean section 05/20/2013   S/P cesarean section 11/18/2014    Family History  Adopted: Yes  Problem Relation Age of Onset   Diabetes Mother    Asthma Other    Hyperlipidemia Other    Obesity Other    Diabetes Maternal Grandmother    Hypertension Maternal Grandmother    Diabetes Paternal Grandmother    Hypertension Paternal Grandmother     Past Surgical History:  Procedure Laterality Date   CESAREAN SECTION     CESAREAN SECTION N/A 05/19/2013   Procedure: CESAREAN SECTION;  Surgeon: Sherron Monday, MD;  Location: [facility];  Service: Obstetrics;  Laterality: N/A;   CESAREAN SECTION WITH BILATERAL TUBAL LIGATION Bilateral 11/17/2014   Procedure: CESAREAN SECTION WITH BILATERAL TUBAL  LIGATION;  Surgeon: Sherian Rein, MD;  Location: [facility];  Service: Obstetrics;  Laterality: Bilateral;   TOENAIL EXCISION     WISDOM TOOTH EXTRACTION     Social History   Occupational History    Employer: [employer]  Tobacco Use   Smoking status: Never   Smokeless tobacco: Never  Vaping Use   Vaping Use: Never used  Substance and Sexual Activity   Alcohol use: No   Drug use: No   Sexual activity: Not Currently

## 2023-04-27 ENCOUNTER — Ambulatory Visit: Payer: 59 | Attending: Physician Assistant

## 2023-04-27 ENCOUNTER — Other Ambulatory Visit: Payer: Self-pay

## 2023-04-27 DIAGNOSIS — M1711 Unilateral primary osteoarthritis, right knee: Secondary | ICD-10-CM | POA: Diagnosis not present

## 2023-04-27 DIAGNOSIS — M1712 Unilateral primary osteoarthritis, left knee: Secondary | ICD-10-CM | POA: Diagnosis not present

## 2023-04-27 NOTE — Therapy (Signed)
OUTPATIENT PHYSICAL THERAPY LOWER EXTREMITY EVALUATION   Patient Name: Audrey [name] MRN: [id number] DOB:1986-02-04, 37 y.o., female Today's Date: 04/27/2023  END OF SESSION:  PT End of Session - 04/27/23 1613     Visit Number 1    Number of Visits 17    Date for PT Re-Evaluation 06/22/23    Authorization Type [facility]    PT Start Time 1525    PT Stop Time 1605    PT Time Calculation (min) 40 min    Activity Tolerance Patient tolerated treatment well    Behavior During Therapy [facility] for tasks assessed/performed             Past Medical History:  Diagnosis Date   Arthritis    Atypical chest pain 10/03/2020   Common migraine with intractable migraine 01/23/2017   Diabetes mellitus without complication (HCC)    Fetal demise 05/19/2013   GERD (gastroesophageal reflux disease)    with pregnancy   Gestational diabetes    Headache    migraines   Hemorrhage after delivery of fetus 08/12/2006   Morbidly obese (HCC) 01/23/2017   Preeclampsia    Pregnancy induced hypertension    S/P cesarean section 05/20/2013   S/P cesarean section 11/18/2014   Past Surgical History:  Procedure Laterality Date   CESAREAN SECTION     CESAREAN SECTION N/A 05/19/2013   Procedure: CESAREAN SECTION;  Surgeon: Sherron Monday, MD;  Location: [facility];  Service: Obstetrics;  Laterality: N/A;   CESAREAN SECTION WITH BILATERAL TUBAL LIGATION Bilateral 11/17/2014   Procedure: CESAREAN SECTION WITH BILATERAL TUBAL LIGATION;  Surgeon: Sherian Rein, MD;  Location: [facility];  Service: Obstetrics;  Laterality: Bilateral;   TOENAIL EXCISION     WISDOM TOOTH EXTRACTION     Patient Active Problem List   Diagnosis Date Noted   Subacute cough 03/24/2023   Dandruff 03/24/2023   Acne vulgaris 03/24/2023   Posterior rhinorrhea 02/13/2023   Rash and nonspecific skin eruption 01/17/2023   Menorrhagia with irregular cycle 01/17/2023   Insulin resistance 01/17/2023   Iron deficiency anemia due to chronic blood  loss 11/17/2022   Osteoarthritis of knees, bilateral 07/13/2020   Class 3 severe obesity with body mass index (BMI) of 40.0 to 44.9 in adult [facility]) 01/27/2019   Bilateral knee pain 01/27/2019   Breast lump on left side at 3 o'clock position 01/27/2019   Reflux gastritis 01/26/2019   Migraine 07/12/2014   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 She denies trauma or MOI, notes pain has been off/on in R for years while L is more recent. Going down stairs hurts medial knee bilaterally while prolonged sitting while driving hurts both knees posteriorly. Has some tingling, denies other paresthesias. Has occasional knee buckling, feels like this happens especially when going down stairs.  PERTINENT HISTORY: DMII  PAIN:  Are you having pain?  Yes: NPRS scale: 0/10 Worst: 10/10 Pain location: bilateral knees R>L Pain description: sharp Aggravating factors: stairs, driving  Relieving factors: Injections, otherwise none  PRECAUTIONS: None  WEIGHT BEARING RESTRICTIONS: No  FALLS:  Has patient fallen in last 6 months? No  LIVING ENVIRONMENT: Lives with: lives with their family Lives in: House/apartment Stairs: Yes: External: 19 steps; bilateral but cannot reach both Has following equipment at home: None  OCCUPATION: Nurse  PLOF: Independent  PATIENT GOALS: decrease knee pain, be able to get back to  the gym  OBJECTIVE:   DIAGNOSTIC FINDINGS:   See imaging for recent XRays  PATIENT SURVEYS:  FOTO: 67% function; 74% predicted   COGNITION: Overall cognitive status: Within functional limits for tasks assessed     SENSATION: WFL  POSTURE: rounded shoulders, forward head, and knee valgus  PALPATION: No overt TTP noted  LOWER EXTREMITY ROM:  Active ROM Right eval Left eval  Hip flexion    Hip extension    Hip abduction    Hip adduction    Hip internal rotation    Hip external rotation    Knee flexion WNL WNL  Knee extension WNL WNL  Ankle dorsiflexion    Ankle plantarflexion    Ankle inversion    Ankle eversion     (Blank rows = not tested)  LOWER EXTREMITY MMT:  MMT Right eval Left eval  Hip flexion 5/5 5/5  Hip extension    Hip abduction 3+/5 3+/5  Hip adduction    Hip internal rotation    Hip external rotation    Knee flexion 5/5 5/5  Knee extension 5/5 5/5  Ankle dorsiflexion    Ankle plantarflexion    Ankle inversion    Ankle eversion     (Blank rows = not tested)  LOWER EXTREMITY SPECIAL TESTS:  Knee special tests: Posterior drawer test: negative and Lachman Test: negative  FUNCTIONAL TESTS:  30 Second Sit to Stand: 10 reps with UE  GAIT: Distance walked: 72ft Assistive device utilized: None Level of assistance: Complete Independence Comments: slight antalgic gait towards R  TREATMENT: [facility] Adult PT Treatment:                                                DATE: 04/27/2023 Therapeutic Exercise: S/L hip abd x 5 each Supine quad sets x 5 - 5" hold Supine SLR x 5 each S/L clamshell GTB x 5 each  PATIENT EDUCATION:  Education details: eval findings, FOTO, HEP, POC Person educated: Patient Education method: Explanation, Demonstration, and Handouts Education comprehension: verbalized understanding and returned demonstration  HOME EXERCISE PROGRAM: Access Code: [email or web address] URL: [email or web address] Date:  04/27/2023 Prepared by: Edwinna Areola  Exercises - Sidelying Hip Abduction  - 1 x daily - 7 x weekly - 2 sets - 10 reps - Supine Quadricep Sets  - 1 x daily - 7 x weekly - 2 sets - 10 reps - 5 sec hold - Active Straight Leg Raise with Quad Set  - 1 x daily - 7 x weekly - 3 sets - 10 reps - Clamshell with Resistance  - 1 x daily - 7 x weekly - 2-3 sets - 10 reps - green band hold  ASSESSMENT:  CLINICAL IMPRESSION: Patient is a 37 y.o. F who was seen today for physical therapy evaluation and treatment for chronic bilateral knee pain, R>L. Physical findings are consistent with referring provider impression as pt has noted pain in both knees and proximal hip muscle weakness.  Her FOTO score shows she is operating below PLOF for subjective functional ability. Pt would benefit from skilled PT services working on improving quad and proximal hip strength in order to decrease pain and improve mobility.    OBJECTIVE IMPAIRMENTS: decreased activity tolerance, decreased mobility, difficulty walking, decreased strength, and pain.   ACTIVITY LIMITATIONS: standing, squatting, stairs, and locomotion level  PARTICIPATION LIMITATIONS: driving, shopping, community activity, occupation, and yard work  PERSONAL FACTORS: Time since onset of injury/illness/exacerbation and 1 comorbidity: DMII  are also affecting patient's functional outcome.   REHAB POTENTIAL: Excellent  CLINICAL DECISION MAKING: Stable/uncomplicated  EVALUATION COMPLEXITY: Low   GOALS: Goals reviewed with patient? No  SHORT TERM GOALS: Target date: 05/18/2023   Pt will be compliant and knowledgeable with initial HEP for improved comfort and carryover Baseline: initial HEP given  Goal status: INITIAL  2.  Pt will self report bilateral knee pain no greater than 6/10 for improved comfort and functional ability Baseline: 10/10 at worst Goal status: INITIAL   LONG TERM GOALS: Target date: 06/22/2023   Pt will improve FOTO function score  to no less than 74% as proxy for functional improvement Baseline: 67% function Goal status: INITIAL   2. Pt will self report bilateral knee pain no greater than 3/10 for improved comfort and functional ability Baseline: 10/10 at worst Goal status: INITIAL   3.  Pt will increase 30 Second Sit to Stand rep count to no less than 12 reps for improved balance, strength, and functional mobility Baseline: 10 reps with UE (MCID 2) Goal status: INITIAL   4.  Pt will improve bilateral hip abduction MMT to no less than 5/5 for improved mobility and decreased knee pain Baseline: see MMT chart Goal status: INITIAL  5.  Pt will be able to ambulate up/down a flight of stairs with no increase in bilateral knee pain for improved comfort and safety with home and community navigation Baseline: unable Goal status: INITIAL  PLAN:  PT FREQUENCY: 1-2x/week  PT DURATION: 8 weeks  PLANNED INTERVENTIONS: Therapeutic exercises, Therapeutic activity, Neuromuscular re-education, Balance training, Gait training, Patient/Family education, Self Care, Joint mobilization, Aquatic Therapy, Dry Needling, Electrical stimulation, Cryotherapy, Moist heat, Taping, Vasopneumatic device, Manual therapy, and Re-evaluation  PLAN FOR NEXT SESSION: assess HEP response, knee taping, quad and hip abd strengthening   Eloy End, PT 04/27/2023, 4:18 PM

## 2023-05-13 ENCOUNTER — Ambulatory Visit: Payer: 59

## 2023-05-15 NOTE — Therapy (Unsigned)
OUTPATIENT PHYSICAL THERAPY TREATMENT NOTE   Patient Name: Audrey [name] MRN: [id number] DOB:01/03/1986, 37 y.o., female Today's Date: 05/18/2023  PCP: Tollie Eth, NP  REFERRING PROVIDER: Persons, [place], [place]   END OF SESSION:   PT End of Session - 05/18/23 1524     Visit Number 2    Number of Visits 17    Date for PT Re-Evaluation 06/22/23    Authorization Type [facility]    PT Start Time 1525    PT Stop Time 1610    PT Time Calculation (min) 45 min    Activity Tolerance Patient tolerated treatment well    Behavior During Therapy [facility] for tasks assessed/performed             Past Medical History:  Diagnosis Date   Arthritis    Atypical chest pain 10/03/2020   Common migraine with intractable migraine 01/23/2017   Diabetes mellitus without complication (HCC)    Fetal demise 05/19/2013   GERD (gastroesophageal reflux disease)    with pregnancy   Gestational diabetes    Headache    migraines   Hemorrhage after delivery of fetus 08/12/2006   Morbidly obese (HCC) 01/23/2017   Preeclampsia    Pregnancy induced hypertension    S/P cesarean section 05/20/2013   S/P cesarean section 11/18/2014   Past Surgical History:  Procedure Laterality Date   CESAREAN SECTION     CESAREAN SECTION N/A 05/19/2013   Procedure: CESAREAN SECTION;  Surgeon: Sherron Monday, MD;  Location: [facility];  Service: Obstetrics;  Laterality: N/A;   CESAREAN SECTION WITH BILATERAL TUBAL LIGATION Bilateral 11/17/2014   Procedure: CESAREAN SECTION WITH BILATERAL TUBAL LIGATION;  Surgeon: Sherian Rein, MD;  Location: [facility];  Service: Obstetrics;  Laterality: Bilateral;   TOENAIL EXCISION     WISDOM TOOTH EXTRACTION     Patient Active Problem List   Diagnosis Date Noted   Subacute cough 03/24/2023   Dandruff 03/24/2023   Acne vulgaris 03/24/2023   Posterior rhinorrhea 02/13/2023   Rash and nonspecific skin eruption 01/17/2023   Menorrhagia with irregular cycle 01/17/2023   Insulin  resistance 01/17/2023   Iron deficiency anemia due to chronic blood loss 11/17/2022   Osteoarthritis of knees, bilateral 07/13/2020   Class 3 severe obesity with body mass index (BMI) of 40.0 to 44.9 in adult [facility]) 01/27/2019   Bilateral knee pain 01/27/2019   Breast lump on left side at 3 o'clock position 01/27/2019   Reflux gastritis 01/26/2019   Migraine 07/12/2014   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    REFERRING DIAG: M17.12 (ICD-10-CM) - Unilateral primary osteoarthritis, left knee M17.11 (ICD-10-CM) - Unilateral primary osteoarthritis, right knee  THERAPY DIAG:  Unilateral primary osteoarthritis, left knee  Unilateral primary osteoarthritis, right knee  Rationale for Evaluation and Treatment Rehabilitation  PERTINENT HISTORY: DMII   PRECAUTIONS: None   SUBJECTIVE:  SUBJECTIVE STATEMENT:  Has issues with sciatica and it has flared up recently.  Has difficulty tolerating supine positions.   PAIN:  Are you having pain? Yes: NPRS scale: 2/10 Pain location: knees Pain description: mild ache Aggravating factors: descending stairs Relieving factors: rest   OBJECTIVE: (objective measures completed at initial evaluation unless otherwise dated)   DIAGNOSTIC FINDINGS:             See imaging for recent XRays   PATIENT SURVEYS:  FOTO: 67% function; 74% predicted    COGNITION: Overall cognitive status: Within functional limits for tasks assessed                         SENSATION: WFL   POSTURE: rounded shoulders, forward head, and knee valgus   PALPATION: No overt TTP noted   LOWER EXTREMITY ROM:   Active ROM Right eval Left eval  Hip flexion      Hip extension      Hip abduction      Hip adduction      Hip internal rotation      Hip external rotation      Knee flexion WNL  WNL  Knee extension WNL WNL  Ankle dorsiflexion      Ankle plantarflexion      Ankle inversion      Ankle eversion       (Blank rows = not tested)   LOWER EXTREMITY MMT:   MMT Right eval Left eval  Hip flexion 5/5 5/5  Hip extension      Hip abduction 3+/5 3+/5  Hip adduction      Hip internal rotation      Hip external rotation      Knee flexion 5/5 5/5  Knee extension 5/5 5/5  Ankle dorsiflexion      Ankle plantarflexion      Ankle inversion      Ankle eversion       (Blank rows = not tested)   LOWER EXTREMITY SPECIAL TESTS:  Knee special tests: Posterior drawer test: negative and Lachman Test: negative   FUNCTIONAL TESTS:  30 Second Sit to Stand: 10 reps with UE   GAIT: Distance walked: 52ft Assistive device utilized: None Level of assistance: Complete Independence Comments: slight antalgic gait towards R   TREATMENT: [facility] Adult PT Treatment:                                                DATE: 05/18/23 Therapeutic Exercise: Nustep L2 8 min Supine SLR 15x B w/QS Supine hip fallouts GTB 15x B 15/15 Unilaterally Bridge against GTB 15x  Bridge with ball 15x SAQs 2# 15x B PF against wall 15x TKE in standing GTB 15/15 FAQs with adduction 2# 15x Seated hamstring stretch 30s x2 B  [facility] Adult PT Treatment:                                                DATE: 04/27/2023 Therapeutic Exercise: S/L hip abd x 5 each Supine quad sets x 5 - 5" hold Supine SLR x 5 each S/L clamshell GTB x 5 each   PATIENT EDUCATION:  Education details: eval findings, FOTO, HEP, POC Person educated:  Patient Education method: Explanation, Demonstration, and Handouts Education comprehension: verbalized understanding and returned demonstration   HOME EXERCISE PROGRAM: Access Code: [email or web address] URL: [email or web address] Date: 04/27/2023 Prepared by: Edwinna Areola   Exercises - Sidelying Hip Abduction  - 1 x daily - 7 x weekly - 2 sets - 10 reps - Supine Quadricep Sets   - 1 x daily - 7 x weekly - 2 sets - 10 reps - 5 sec hold - Active Straight Leg Raise with Quad Set  - 1 x daily - 7 x weekly - 3 sets - 10 reps - Clamshell with Resistance  - 1 x daily - 7 x weekly - 2-3 sets - 10 reps - green band hold   ASSESSMENT:   CLINICAL IMPRESSION: Patient returns for her first f/u session.  Current knee pain is minimal with no distinct reason to tape.  Treatment today focused on quad strength and TKE activity.  Added hip and ankle strengthening and stabilizing tasks.  Incorporated weight and resistance as noted with patient able to tolerate all tasks w/o symptom exacerbation.     Patient is a 37 y.o. F who was seen today for physical therapy evaluation and treatment for chronic bilateral knee pain, R>L. Physical findings are consistent with referring provider impression as pt has noted pain in both knees and proximal hip muscle weakness. Her FOTO score shows she is operating below PLOF for subjective functional ability. Pt would benefit from skilled PT services working on improving quad and proximal hip strength in order to decrease pain and improve mobility.     OBJECTIVE IMPAIRMENTS: decreased activity tolerance, decreased mobility, difficulty walking, decreased strength, and pain.    ACTIVITY LIMITATIONS: standing, squatting, stairs, and locomotion level   PARTICIPATION LIMITATIONS: driving, shopping, community activity, occupation, and yard work   PERSONAL FACTORS: Time since onset of injury/illness/exacerbation and 1 comorbidity: DMII  are also affecting patient's functional outcome.    REHAB POTENTIAL: Excellent   CLINICAL DECISION MAKING: Stable/uncomplicated   EVALUATION COMPLEXITY: Low     GOALS: Goals reviewed with patient? No   SHORT TERM GOALS: Target date: 05/18/2023   Pt will be compliant and knowledgeable with initial HEP for improved comfort and carryover Baseline: initial HEP given  Goal status: INITIAL   2.  Pt will self report bilateral  knee pain no greater than 6/10 for improved comfort and functional ability Baseline: 10/10 at worst Goal status: INITIAL    LONG TERM GOALS: Target date: 06/22/2023   Pt will improve FOTO function score to no less than 74% as proxy for functional improvement Baseline: 67% function Goal status: INITIAL    2. Pt will self report bilateral knee pain no greater than 3/10 for improved comfort and functional ability Baseline: 10/10 at worst Goal status: INITIAL    3.  Pt will increase 30 Second Sit to Stand rep count to no less than 12 reps for improved balance, strength, and functional mobility Baseline: 10 reps with UE (MCID 2) Goal status: INITIAL    4.  Pt will improve bilateral hip abduction MMT to no less than 5/5 for improved mobility and decreased knee pain Baseline: see MMT chart Goal status: INITIAL   5.  Pt will be able to ambulate up/down a flight of stairs with no increase in bilateral knee pain for improved comfort and safety with home and community navigation Baseline: unable Goal status: INITIAL   PLAN:   PT FREQUENCY: 1-2x/week   PT DURATION: 8 weeks   PLANNED INTERVENTIONS:  Therapeutic exercises, Therapeutic activity, Neuromuscular re-education, Balance training, Gait training, Patient/Family education, Self Care, Joint mobilization, Aquatic Therapy, Dry Needling, Electrical stimulation, Cryotherapy, Moist heat, Taping, Vasopneumatic device, Manual therapy, and Re-evaluation   PLAN FOR NEXT SESSION: assess HEP response, knee taping, quad and hip abd strengthening   Hildred Laser, PT 05/18/2023, 4:11 PM

## 2023-05-18 ENCOUNTER — Ambulatory Visit: Payer: 59 | Attending: Physician Assistant

## 2023-05-18 DIAGNOSIS — M1711 Unilateral primary osteoarthritis, right knee: Secondary | ICD-10-CM | POA: Diagnosis not present

## 2023-05-18 DIAGNOSIS — M1712 Unilateral primary osteoarthritis, left knee: Secondary | ICD-10-CM | POA: Diagnosis not present

## 2023-05-22 ENCOUNTER — Encounter: Payer: Self-pay | Admitting: Nurse Practitioner

## 2023-05-22 ENCOUNTER — Other Ambulatory Visit (HOSPITAL_COMMUNITY): Payer: Self-pay

## 2023-05-22 MED ORDER — AMOXICILLIN 500 MG PO CAPS
500.0000 mg | ORAL_CAPSULE | Freq: Two times a day (BID) | ORAL | 2 refills | Status: DC
  Filled 2023-05-22: qty 14, 7d supply, fill #0
  Filled 2023-07-24: qty 14, 7d supply, fill #1
  Filled 2023-12-01: qty 14, 7d supply, fill #2

## 2023-05-22 NOTE — Addendum Note (Signed)
Addended by: Dennise Raabe, [place] Dec E on: 05/22/2023 12:41 PM   Modules accepted: Orders

## 2023-05-26 ENCOUNTER — Ambulatory Visit: Payer: 59

## 2023-05-26 DIAGNOSIS — M1711 Unilateral primary osteoarthritis, right knee: Secondary | ICD-10-CM | POA: Diagnosis not present

## 2023-05-26 DIAGNOSIS — M1712 Unilateral primary osteoarthritis, left knee: Secondary | ICD-10-CM

## 2023-05-26 NOTE — Therapy (Signed)
OUTPATIENT PHYSICAL THERAPY TREATMENT NOTE   Patient Name: Audrey [name] MRN: [id number] DOB:[birth year], 37 y.o., female Today's Date: 05/26/2023  PCP: Tollie Eth, NP  REFERRING PROVIDER: Persons, [place], [place]   END OF SESSION:   PT End of Session - 05/26/23 1527     Visit Number 3    Number of Visits 17    Date for PT Re-Evaluation 06/22/23    Authorization Type [facility]    PT Start Time 1530    PT Stop Time 1609    PT Time Calculation (min) 39 min    Activity Tolerance Patient tolerated treatment well    Behavior During Therapy [facility] for tasks assessed/performed              Past Medical History:  Diagnosis Date   Arthritis    Atypical chest pain 10/03/2020   Common migraine with intractable migraine 01/23/2017   Diabetes mellitus without complication (HCC)    Fetal demise 05/19/2013   GERD (gastroesophageal reflux disease)    with pregnancy   Gestational diabetes    Headache    migraines   Hemorrhage after delivery of fetus 08/12/2006   Morbidly obese (HCC) 01/23/2017   Preeclampsia    Pregnancy induced hypertension    S/P cesarean section 05/20/2013   S/P cesarean section 11/18/2014   Past Surgical History:  Procedure Laterality Date   CESAREAN SECTION     CESAREAN SECTION N/A 05/19/2013   Procedure: CESAREAN SECTION;  Surgeon: Sherron Monday, MD;  Location: [facility];  Service: Obstetrics;  Laterality: N/A;   CESAREAN SECTION WITH BILATERAL TUBAL LIGATION Bilateral 11/17/2014   Procedure: CESAREAN SECTION WITH BILATERAL TUBAL LIGATION;  Surgeon: Sherian Rein, MD;  Location: [facility];  Service: Obstetrics;  Laterality: Bilateral;   TOENAIL EXCISION     WISDOM TOOTH EXTRACTION     Patient Active Problem List   Diagnosis Date Noted   Subacute cough 03/24/2023   Dandruff 03/24/2023   Acne vulgaris 03/24/2023   Posterior rhinorrhea 02/13/2023   Rash and nonspecific skin eruption 01/17/2023   Menorrhagia with irregular cycle 01/17/2023   Insulin  resistance 01/17/2023   Iron deficiency anemia due to chronic blood loss 11/17/2022   Osteoarthritis of knees, bilateral 07/13/2020   Class 3 severe obesity with body mass index (BMI) of 40.0 to 44.9 in adult [facility]) 01/27/2019   Bilateral knee pain 01/27/2019   Breast lump on left side at 3 o'clock position 01/27/2019   Reflux gastritis 01/26/2019   Migraine 07/12/2014   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    REFERRING DIAG: M17.12 (ICD-10-CM) - Unilateral primary osteoarthritis, left knee M17.11 (ICD-10-CM) - Unilateral primary osteoarthritis, right knee  THERAPY DIAG:  Unilateral primary osteoarthritis, left knee  Unilateral primary osteoarthritis, right knee  Rationale for Evaluation and Treatment Rehabilitation  PERTINENT HISTORY: DMII   PRECAUTIONS: None   SUBJECTIVE:  SUBJECTIVE STATEMENT:  Pt presents to PT with reports of posterior L knee pain. Has been compliant with HEP with no adverse effect.    PAIN:  Are you having pain?  Yes: NPRS scale: 2/10 Pain location: knees Pain description: mild ache Aggravating factors: descending stairs Relieving factors: rest   OBJECTIVE: (objective measures completed at initial evaluation unless otherwise dated)   DIAGNOSTIC FINDINGS:             See imaging for recent XRays   PATIENT SURVEYS:  FOTO: 67% function; 74% predicted    COGNITION: Overall cognitive status: Within functional limits for tasks assessed                         SENSATION: WFL   POSTURE: rounded shoulders, forward head, and knee valgus   PALPATION: No overt TTP noted   LOWER EXTREMITY ROM:   Active ROM Right eval Left eval  Hip flexion      Hip extension      Hip abduction      Hip adduction      Hip internal rotation      Hip external rotation      Knee  flexion WNL WNL  Knee extension WNL WNL  Ankle dorsiflexion      Ankle plantarflexion      Ankle inversion      Ankle eversion       (Blank rows = not tested)   LOWER EXTREMITY MMT:   MMT Right eval Left eval  Hip flexion 5/5 5/5  Hip extension      Hip abduction 3+/5 3+/5  Hip adduction      Hip internal rotation      Hip external rotation      Knee flexion 5/5 5/5  Knee extension 5/5 5/5  Ankle dorsiflexion      Ankle plantarflexion      Ankle inversion      Ankle eversion       (Blank rows = not tested)   LOWER EXTREMITY SPECIAL TESTS:  Knee special tests: Posterior drawer test: negative and Lachman Test: negative   FUNCTIONAL TESTS:  30 Second Sit to Stand: 10 reps with UE   GAIT: Distance walked: 18ft Assistive device utilized: None Level of assistance: Complete Independence Comments: slight antalgic gait towards R   TREATMENT: [facility] Adult PT Treatment:                                                DATE: 05/26/23 Therapeutic Exercise: Supine SLR 15x B w/QS Supine clamshell 2x15 GTB Bridge against GTB 2x10 TKE with ball 2x10 each Supine hamstring stretch with strap 2x30" each S/L hip abd 2x10 each FAQ with adduction 3x10  Seated hamstring stretch 2x30" each Standing mini squat 2x10  [facility] Adult PT Treatment:                                                DATE: 05/18/23 Therapeutic Exercise: Nustep L2 8 min Supine SLR 15x B w/QS Supine hip fallouts GTB 15x B 15/15 Unilaterally Bridge against GTB 15x  Bridge with ball 15x SAQs 2# 15x B PF against wall 15x TKE in standing GTB 15/15  FAQs with adduction 2# 15x Seated hamstring stretch 30s x2 B  [facility] Adult PT Treatment:                                                DATE: 04/27/2023 Therapeutic Exercise: S/L hip abd x 5 each Supine quad sets x 5 - 5" hold Supine SLR x 5 each S/L clamshell GTB x 5 each   PATIENT EDUCATION:  Education details: eval findings, FOTO, HEP, POC Person educated:  Patient Education method: Explanation, Demonstration, and Handouts Education comprehension: verbalized understanding and returned demonstration   HOME EXERCISE PROGRAM: Access Code: [email or web address] URL: [email or web address] Date: 05/26/2023 Prepared by: Edwinna Areola  Exercises - Sidelying Hip Abduction  - 1 x daily - 7 x weekly - 2 sets - 10 reps - Supine Quadricep Sets  - 1 x daily - 7 x weekly - 2 sets - 10 reps - 5 sec hold - Active Straight Leg Raise with Quad Set  - 1 x daily - 7 x weekly - 3 sets - 10 reps - Clamshell with Resistance  - 1 x daily - 7 x weekly - 2-3 sets - 10 reps - green band hold - Supine Hamstring Stretch with Strap  - 1 x daily - 7 x weekly - 2 reps - 30 sec hold - Seated Hamstring Stretch  - 1 x daily - 7 x weekly - 2 sets - 30 sec hold   ASSESSMENT:   CLINICAL IMPRESSION:  Pt was able to complete all prescribed exercises with no adverse effect. Noted decrease pain post session. HEP updated for hamstring stretching. Pt is progressing as expected, will continue per POC.     Patient is a 37 y.o. F who was seen today for physical therapy evaluation and treatment for chronic bilateral knee pain, R>L. Physical findings are consistent with referring provider impression as pt has noted pain in both knees and proximal hip muscle weakness. Her FOTO score shows she is operating below PLOF for subjective functional ability. Pt would benefit from skilled PT services working on improving quad and proximal hip strength in order to decrease pain and improve mobility.     OBJECTIVE IMPAIRMENTS: decreased activity tolerance, decreased mobility, difficulty walking, decreased strength, and pain.    ACTIVITY LIMITATIONS: standing, squatting, stairs, and locomotion level   PARTICIPATION LIMITATIONS: driving, shopping, community activity, occupation, and yard work   PERSONAL FACTORS: Time since onset of injury/illness/exacerbation and 1 comorbidity: DMII  are also  affecting patient's functional outcome.      GOALS: Goals reviewed with patient? No   SHORT TERM GOALS: Target date: 05/18/2023   Pt will be compliant and knowledgeable with initial HEP for improved comfort and carryover Baseline: initial HEP given  Goal status: INITIAL   2.  Pt will self report bilateral knee pain no greater than 6/10 for improved comfort and functional ability Baseline: 10/10 at worst Goal status: INITIAL    LONG TERM GOALS: Target date: 06/22/2023   Pt will improve FOTO function score to no less than 74% as proxy for functional improvement Baseline: 67% function Goal status: INITIAL    2. Pt will self report bilateral knee pain no greater than 3/10 for improved comfort and functional ability Baseline: 10/10 at worst Goal status: INITIAL    3.  Pt will increase 30 Second Sit to Stand rep count to  no less than 12 reps for improved balance, strength, and functional mobility Baseline: 10 reps with UE (MCID 2) Goal status: INITIAL    4.  Pt will improve bilateral hip abduction MMT to no less than 5/5 for improved mobility and decreased knee pain Baseline: see MMT chart Goal status: INITIAL   5.  Pt will be able to ambulate up/down a flight of stairs with no increase in bilateral knee pain for improved comfort and safety with home and community navigation Baseline: unable Goal status: INITIAL   PLAN:   PT FREQUENCY: 1-2x/week   PT DURATION: 8 weeks   PLANNED INTERVENTIONS: Therapeutic exercises, Therapeutic activity, Neuromuscular re-education, Balance training, Gait training, Patient/Family education, Self Care, Joint mobilization, Aquatic Therapy, Dry Needling, Electrical stimulation, Cryotherapy, Moist heat, Taping, Vasopneumatic device, Manual therapy, and Re-evaluation   PLAN FOR NEXT SESSION: assess HEP response, knee taping, quad and hip abd strengthening   Eloy End, PT 05/26/2023, 4:11 PM

## 2023-06-02 ENCOUNTER — Ambulatory Visit: Payer: 59 | Attending: Physician Assistant

## 2023-06-02 DIAGNOSIS — M1712 Unilateral primary osteoarthritis, left knee: Secondary | ICD-10-CM | POA: Diagnosis not present

## 2023-06-02 DIAGNOSIS — M1711 Unilateral primary osteoarthritis, right knee: Secondary | ICD-10-CM | POA: Diagnosis not present

## 2023-06-02 NOTE — Therapy (Signed)
OUTPATIENT PHYSICAL THERAPY TREATMENT NOTE   Patient Name: Audrey [name] MRN: [id number] DOB:1986-11-14, 37 y.o., female Today's Date: 06/02/2023  PCP: Tollie Eth, NP  REFERRING PROVIDER: Persons, [place], [place]   END OF SESSION:   PT End of Session - 06/02/23 1534     Visit Number 4    Number of Visits 17    Date for PT Re-Evaluation 06/22/23    Authorization Type [facility]    PT Start Time 1534    PT Stop Time 1612    PT Time Calculation (min) 38 min    Activity Tolerance Patient tolerated treatment well    Behavior During Therapy [facility] for tasks assessed/performed               Past Medical History:  Diagnosis Date   Arthritis    Atypical chest pain 10/03/2020   Common migraine with intractable migraine 01/23/2017   Diabetes mellitus without complication (HCC)    Fetal demise 05/19/2013   GERD (gastroesophageal reflux disease)    with pregnancy   Gestational diabetes    Headache    migraines   Hemorrhage after delivery of fetus 08/12/2006   Morbidly obese (HCC) 01/23/2017   Preeclampsia    Pregnancy induced hypertension    S/P cesarean section 05/20/2013   S/P cesarean section 11/18/2014   Past Surgical History:  Procedure Laterality Date   CESAREAN SECTION     CESAREAN SECTION N/A 05/19/2013   Procedure: CESAREAN SECTION;  Surgeon: Sherron Monday, MD;  Location: [facility];  Service: Obstetrics;  Laterality: N/A;   CESAREAN SECTION WITH BILATERAL TUBAL LIGATION Bilateral 11/17/2014   Procedure: CESAREAN SECTION WITH BILATERAL TUBAL LIGATION;  Surgeon: Sherian Rein, MD;  Location: [facility];  Service: Obstetrics;  Laterality: Bilateral;   TOENAIL EXCISION     WISDOM TOOTH EXTRACTION     Patient Active Problem List   Diagnosis Date Noted   Subacute cough 03/24/2023   Dandruff 03/24/2023   Acne vulgaris 03/24/2023   Posterior rhinorrhea 02/13/2023   Rash and nonspecific skin eruption 01/17/2023   Menorrhagia with irregular cycle 01/17/2023   Insulin  resistance 01/17/2023   Iron deficiency anemia due to chronic blood loss 11/17/2022   Osteoarthritis of knees, bilateral 07/13/2020   Class 3 severe obesity with body mass index (BMI) of 40.0 to 44.9 in adult [facility]) 01/27/2019   Bilateral knee pain 01/27/2019   Breast lump on left side at 3 o'clock position 01/27/2019   Reflux gastritis 01/26/2019   Migraine 07/12/2014   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    REFERRING DIAG: M17.12 (ICD-10-CM) - Unilateral primary osteoarthritis, left knee M17.11 (ICD-10-CM) - Unilateral primary osteoarthritis, right knee  THERAPY DIAG:  Unilateral primary osteoarthritis, left knee  Unilateral primary osteoarthritis, right knee  Rationale for Evaluation and Treatment Rehabilitation  PERTINENT HISTORY: DMII   PRECAUTIONS: None   SUBJECTIVE:  SUBJECTIVE STATEMENT:  Pt presents pt with no current knee pain. Has been compliant with HEP no adverse effect.     PAIN:  Are you having pain?  Yes: NPRS scale: 2/10 Pain location: knees Pain description: mild ache Aggravating factors: descending stairs Relieving factors: rest   OBJECTIVE: (objective measures completed at initial evaluation unless otherwise dated)   DIAGNOSTIC FINDINGS:             See imaging for recent XRays   PATIENT SURVEYS:  FOTO: 67% function; 74% predicted    COGNITION: Overall cognitive status: Within functional limits for tasks assessed                         SENSATION: WFL   POSTURE: rounded shoulders, forward head, and knee valgus   PALPATION: No overt TTP noted   LOWER EXTREMITY ROM:   Active ROM Right eval Left eval  Hip flexion      Hip extension      Hip abduction      Hip adduction      Hip internal rotation      Hip external rotation      Knee flexion WNL WNL   Knee extension WNL WNL  Ankle dorsiflexion      Ankle plantarflexion      Ankle inversion      Ankle eversion       (Blank rows = not tested)   LOWER EXTREMITY MMT:   MMT Right eval Left eval  Hip flexion 5/5 5/5  Hip extension      Hip abduction 3+/5 3+/5  Hip adduction      Hip internal rotation      Hip external rotation      Knee flexion 5/5 5/5  Knee extension 5/5 5/5  Ankle dorsiflexion      Ankle plantarflexion      Ankle inversion      Ankle eversion       (Blank rows = not tested)   LOWER EXTREMITY SPECIAL TESTS:  Knee special tests: Posterior drawer test: negative and Lachman Test: negative   FUNCTIONAL TESTS:  30 Second Sit to Stand: 10 reps with UE   GAIT: Distance walked: 37ft Assistive device utilized: None Level of assistance: Complete Independence Comments: slight antalgic gait towards R   TREATMENT: [facility] Adult PT Treatment:                                                DATE: 06/02/23 Therapeutic Exercise: Supine QS x 10 - 5" hold Supine SLR 2x15 each Standing hip abd 2x10 each Lateral walk RTB x 3 laps at counter Standing TKE with ball 2x10 each STS 2x10 - no UE support LAQ 2x10 3# S/L clamshell 2x10 GTB Bridge against GTB 2x10 Supine hamstring stretch with strap 2x30" each  [facility] Adult PT Treatment:                                                DATE: 05/26/23 Therapeutic Exercise: Supine SLR 15x B w/QS Supine clamshell 2x15 GTB Bridge against GTB 2x10 TKE with ball 2x10 each Supine hamstring stretch with strap 2x30" each S/L hip abd 2x10 each FAQ with  adduction 3x10  Seated hamstring stretch 2x30" each Standing mini squat 2x10  [facility] Adult PT Treatment:                                                DATE: 05/18/23 Therapeutic Exercise: Nustep L2 8 min Supine SLR 15x B w/QS Supine hip fallouts GTB 15x B 15/15 Unilaterally Bridge against GTB 15x  Bridge with ball 15x SAQs 2# 15x B PF against wall 15x TKE in standing GTB  15/15 FAQs with adduction 2# 15x Seated hamstring stretch 30s x2 B    PATIENT EDUCATION:  Education details: HEP update Person educated: Patient Education method: Explanation, Demonstration, and Handouts Education comprehension: verbalized understanding and returned demonstration   HOME EXERCISE PROGRAM: Access Code: [email or web address] URL: [email or web address] Date: 05/26/2023 Prepared by: Edwinna Areola  Exercises - Sidelying Hip Abduction  - 1 x daily - 7 x weekly - 2 sets - 10 reps - Supine Quadricep Sets  - 1 x daily - 7 x weekly - 2 sets - 10 reps - 5 sec hold - Active Straight Leg Raise with Quad Set  - 1 x daily - 7 x weekly - 3 sets - 10 reps - Clamshell with Resistance  - 1 x daily - 7 x weekly - 2-3 sets - 10 reps - green band hold - Supine Hamstring Stretch with Strap  - 1 x daily - 7 x weekly - 2 reps - 30 sec hold - Seated Hamstring Stretch  - 1 x daily - 7 x weekly - 2 sets - 30 sec hold   ASSESSMENT:   CLINICAL IMPRESSION:  Pt was able to complete all prescribed exercises with no adverse effect. Therapy foucsed on quad and proximal hamstring strengthening for decreasing pain and improving mobility. Noted decrease pain post session. Pt is progressing as expected, will continue per POC.     Patient is a 37 y.o. F who was seen today for physical therapy evaluation and treatment for chronic bilateral knee pain, R>L. Physical findings are consistent with referring provider impression as pt has noted pain in both knees and proximal hip muscle weakness. Her FOTO score shows she is operating below PLOF for subjective functional ability. Pt would benefit from skilled PT services working on improving quad and proximal hip strength in order to decrease pain and improve mobility.     OBJECTIVE IMPAIRMENTS: decreased activity tolerance, decreased mobility, difficulty walking, decreased strength, and pain.    ACTIVITY LIMITATIONS: standing, squatting, stairs, and locomotion  level   PARTICIPATION LIMITATIONS: driving, shopping, community activity, occupation, and yard work   PERSONAL FACTORS: Time since onset of injury/illness/exacerbation and 1 comorbidity: DMII  are also affecting patient's functional outcome.      GOALS: Goals reviewed with patient? No   SHORT TERM GOALS: Target date: 05/18/2023   Pt will be compliant and knowledgeable with initial HEP for improved comfort and carryover Baseline: initial HEP given  Goal status: INITIAL   2.  Pt will self report bilateral knee pain no greater than 6/10 for improved comfort and functional ability Baseline: 10/10 at worst Goal status: INITIAL    LONG TERM GOALS: Target date: 06/22/2023   Pt will improve FOTO function score to no less than 74% as proxy for functional improvement Baseline: 67% function Goal status: INITIAL    2. Pt will self report bilateral knee pain  no greater than 3/10 for improved comfort and functional ability Baseline: 10/10 at worst Goal status: INITIAL    3.  Pt will increase 30 Second Sit to Stand rep count to no less than 12 reps for improved balance, strength, and functional mobility Baseline: 10 reps with UE (MCID 2) Goal status: INITIAL    4.  Pt will improve bilateral hip abduction MMT to no less than 5/5 for improved mobility and decreased knee pain Baseline: see MMT chart Goal status: INITIAL   5.  Pt will be able to ambulate up/down a flight of stairs with no increase in bilateral knee pain for improved comfort and safety with home and community navigation Baseline: unable Goal status: INITIAL   PLAN:   PT FREQUENCY: 1-2x/week   PT DURATION: 8 weeks   PLANNED INTERVENTIONS: Therapeutic exercises, Therapeutic activity, Neuromuscular re-education, Balance training, Gait training, Patient/Family education, Self Care, Joint mobilization, Aquatic Therapy, Dry Needling, Electrical stimulation, Cryotherapy, Moist heat, Taping, Vasopneumatic device, Manual therapy,  and Re-evaluation   PLAN FOR NEXT SESSION: assess HEP response, knee taping, quad and hip abd strengthening   Eloy End, PT 06/02/2023, 4:53 PM

## 2023-06-10 ENCOUNTER — Ambulatory Visit: Payer: 59

## 2023-06-12 ENCOUNTER — Other Ambulatory Visit (HOSPITAL_COMMUNITY): Payer: Self-pay

## 2023-06-12 ENCOUNTER — Encounter: Payer: Self-pay | Admitting: Hematology and Oncology

## 2023-06-15 NOTE — Therapy (Signed)
OUTPATIENT PHYSICAL THERAPY TREATMENT NOTE/DISCHARGE  PHYSICAL THERAPY DISCHARGE SUMMARY  Visits from Start of Care: 5  Current functional level related to goals / functional outcomes: See goals and objective   Remaining deficits: See goals and objective   Education / Equipment: HEP   Patient agrees to discharge. Patient goals were met. Patient is being discharged due to meeting the stated rehab goals.   Patient Name: Audrey [name] MRN: [id number] DOB:[birth year], 37 y.o., female Today's Date: 06/16/2023  PCP: Tollie Eth, NP  REFERRING PROVIDER: Persons, [place], [place]   END OF SESSION:   PT End of Session - 06/16/23 1052     Visit Number 5    Number of Visits 17    Date for PT Re-Evaluation 06/22/23    Authorization Type [facility]    PT Start Time 1050   arrived late   PT Stop Time 1128    PT Time Calculation (min) 38 min    Activity Tolerance Patient tolerated treatment well    Behavior During Therapy [facility] for tasks assessed/performed                Past Medical History:  Diagnosis Date   Arthritis    Atypical chest pain 10/03/2020   Common migraine with intractable migraine 01/23/2017   Diabetes mellitus without complication (HCC)    Fetal demise 05/19/2013   GERD (gastroesophageal reflux disease)    with pregnancy   Gestational diabetes    Headache    migraines   Hemorrhage after delivery of fetus 08/12/2006   Morbidly obese (HCC) 01/23/2017   Preeclampsia    Pregnancy induced hypertension    S/P cesarean section 05/20/2013   S/P cesarean section 11/18/2014   Past Surgical History:  Procedure Laterality Date   CESAREAN SECTION     CESAREAN SECTION N/A 05/19/2013   Procedure: CESAREAN SECTION;  Surgeon: Sherron Monday, MD;  Location: [facility];  Service: Obstetrics;  Laterality: N/A;   CESAREAN SECTION WITH BILATERAL TUBAL LIGATION Bilateral 11/17/2014   Procedure: CESAREAN SECTION WITH BILATERAL TUBAL LIGATION;  Surgeon: Sherian Rein, MD;   Location: [facility];  Service: Obstetrics;  Laterality: Bilateral;   TOENAIL EXCISION     WISDOM TOOTH EXTRACTION     Patient Active Problem List   Diagnosis Date Noted   Subacute cough 03/24/2023   Dandruff 03/24/2023   Acne vulgaris 03/24/2023   Posterior rhinorrhea 02/13/2023   Rash and nonspecific skin eruption 01/17/2023   Menorrhagia with irregular cycle 01/17/2023   Insulin resistance 01/17/2023   Iron deficiency anemia due to chronic blood loss 11/17/2022   Osteoarthritis of knees, bilateral 07/13/2020   Class 3 severe obesity with body mass index (BMI) of 40.0 to 44.9 in adult [facility]) 01/27/2019   Bilateral knee pain 01/27/2019   Breast lump on left side at 3 o'clock position 01/27/2019   Reflux gastritis 01/26/2019   Migraine 07/12/2014   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    REFERRING DIAG: M17.12 (ICD-10-CM) - Unilateral primary osteoarthritis, left knee M17.11 (ICD-10-CM) - Unilateral primary osteoarthritis, right knee  THERAPY DIAG:  Unilateral primary osteoarthritis, left knee  Unilateral primary osteoarthritis, right knee  Rationale for Evaluation and Treatment Rehabilitation  PERTINENT HISTORY: DMII   PRECAUTIONS: None   SUBJECTIVE:  SUBJECTIVE STATEMENT:  Pt presents to PT with no current knee pain, has been doing well in recent weeks. Able to amb up/down stairs now without pain. Feels ready to discharge at this time.    PAIN:  Are you having pain?  Yes: NPRS scale: 0/10 Pain location: knees Pain description: mild ache Aggravating factors: descending stairs Relieving factors: rest   OBJECTIVE: (objective measures completed at initial evaluation unless otherwise dated)   DIAGNOSTIC FINDINGS:             See imaging for recent XRays   PATIENT SURVEYS:  FOTO: 67%  function; 74% predicted  06/16/2023: 82%   COGNITION: Overall cognitive status: Within functional limits for tasks assessed                         SENSATION: WFL   POSTURE: rounded shoulders, forward head, and knee valgus   PALPATION: No overt TTP noted   LOWER EXTREMITY ROM:   Active ROM Right eval Left eval  Hip flexion      Hip extension      Hip abduction      Hip adduction      Hip internal rotation      Hip external rotation      Knee flexion WNL WNL  Knee extension WNL WNL  Ankle dorsiflexion      Ankle plantarflexion      Ankle inversion      Ankle eversion       (Blank rows = not tested)   LOWER EXTREMITY MMT:   MMT Right eval Left eval Right 06/16/2023 Left 06/16/2023  Hip flexion 5/5 5/5    Hip extension        Hip abduction 3+/5 3+/5 5/5 5/5  Hip adduction        Hip internal rotation        Hip external rotation        Knee flexion 5/5 5/5    Knee extension 5/5 5/5    Ankle dorsiflexion        Ankle plantarflexion        Ankle inversion        Ankle eversion         (Blank rows = not tested)   LOWER EXTREMITY SPECIAL TESTS:  Knee special tests: Posterior drawer test: negative and Lachman Test: negative   FUNCTIONAL TESTS:  30 Second Sit to Stand: 10 reps with UE   GAIT: Distance walked: 40ft Assistive device utilized: None Level of assistance: Complete Independence Comments: slight antalgic gait towards R   TREATMENT: [facility] Adult PT Treatment:                                                DATE: 06/16/23 Therapeutic Activity: Assessment of tests/measures, goals, and outcomes Therapeutic Exercise: Supine QS x 10 - 5" hold Supine SLR 2x15 each Standing hip abd 2x15 each Bridge against blue band 3x10 S/L clamshell blue band 2x10 Supine hamstring stretch with stretch x 60" each Seated hamstring stretch 2x30" each STS x 10 Lateral walk x 3 laps RTB Eccentric heel tap x 10 - 8in (only to 4in depth)  [facility] Adult PT Treatment:  DATE: 06/02/23 Therapeutic Exercise: Supine QS x 10 - 5" hold Supine SLR 2x15 each Standing hip abd 2x10 each Lateral walk RTB x 3 laps at counter Standing TKE with ball 2x10 each STS 2x10 - no UE support LAQ 2x10 3# S/L clamshell 2x10 GTB Bridge against GTB 2x10 Supine hamstring stretch with strap 2x30" each  [facility] Adult PT Treatment:                                                DATE: 05/26/23 Therapeutic Exercise: Supine SLR 15x B w/QS Supine clamshell 2x15 GTB Bridge against GTB 2x10 TKE with ball 2x10 each Supine hamstring stretch with strap 2x30" each S/L hip abd 2x10 each FAQ with adduction 3x10  Seated hamstring stretch 2x30" each Standing mini squat 2x10  [facility] Adult PT Treatment:                                                DATE: 05/18/23 Therapeutic Exercise: Nustep L2 8 min Supine SLR 15x B w/QS Supine hip fallouts GTB 15x B 15/15 Unilaterally Bridge against GTB 15x  Bridge with ball 15x SAQs 2# 15x B PF against wall 15x TKE in standing GTB 15/15 FAQs with adduction 2# 15x Seated hamstring stretch 30s x2 B    PATIENT EDUCATION:  Education details: HEP update Person educated: Patient Education method: Explanation, Demonstration, and Handouts Education comprehension: verbalized understanding and returned demonstration   HOME EXERCISE PROGRAM: Access Code: [email or web address] URL: [email or web address] Date: 06/16/2023 Prepared by: Edwinna Areola  Exercises - Supine Quadricep Sets  - 3-4 x weekly - 10 reps - 5 sec hold - Active Straight Leg Raise with Quad Set  - 3-4 x weekly - 3 sets - 15 reps - Sidelying Hip Abduction  - 3-4 x weekly - 2 sets - 15 reps - Clamshell with Resistance  - 3-4 x weekly - 2-3 sets - 10 reps - blue band hold - Supine Bridge with Resistance Band  - 3-4 x weekly - 3 sets - 10 reps - blue band hold - Supine Hamstring Stretch with Strap  - 3-4 x weekly - 2 reps - 60 sec hold - Seated  Hamstring Stretch  - 3-4 x weekly - 2 sets - 30 sec hold - Sit to Stand Without Arm Support  - 3-4 x weekly - 2-3 sets - 10 reps - Side Stepping with Resistance at Ankles and Counter Support  - 3-4 x weekly - 3 sets - red band hold - Forward Step Down Touch with Heel  - 3-4 x weekly - 2 sets - 10 reps - 4 in step hold   ASSESSMENT:   CLINICAL IMPRESSION:  Pt was able to complete all prescribed exercises and demonstrated knowledge of HEP with no adverse effect. Over the course of PT treatment she has progressed very well, meeting all long-term goals and greatly decreasing c/o bilateral knee pain. She should continue to and maintain improvement with HEP compliance and is being discharged from skilled therapy at this time.      OBJECTIVE IMPAIRMENTS: decreased activity tolerance, decreased mobility, difficulty walking, decreased strength, and pain.    ACTIVITY LIMITATIONS: standing, squatting, stairs, and locomotion level   PARTICIPATION LIMITATIONS: driving, shopping, community activity,  occupation, and yard work   PERSONAL FACTORS: Time since onset of injury/illness/exacerbation and 1 comorbidity: DMII  are also affecting patient's functional outcome.      GOALS: Goals reviewed with patient? No   SHORT TERM GOALS: Target date: 05/18/2023   Pt will be compliant and knowledgeable with initial HEP for improved comfort and carryover Baseline: initial HEP given  Goal status: MET   2.  Pt will self report bilateral knee pain no greater than 6/10 for improved comfort and functional ability Baseline: 10/10 at worst Goal status: MET   LONG TERM GOALS: Target date: 06/22/2023   Pt will improve FOTO function score to no less than 74% as proxy for functional improvement Baseline: 67% function 06/16/2023: 82% function Goal status: MET   2. Pt will self report bilateral knee pain no greater than 3/10 for improved comfort and functional ability Baseline: 10/10 at worst Goal status: MET   3.   Pt will increase 30 Second Sit to Stand rep count to no less than 12 reps for improved balance, strength, and functional mobility Baseline: 10 reps with UE (MCID 2) 06/16/2023: 12 reps Goal status: MET   4.  Pt will improve bilateral hip abduction MMT to no less than 5/5 for improved mobility and decreased knee pain Baseline: see MMT chart Goal status: MET   5.  Pt will be able to ambulate up/down a flight of stairs with no increase in bilateral knee pain for improved comfort and safety with home and community navigation Baseline: unable Goal status: MET   PLAN:   PT FREQUENCY: 1-2x/week   PT DURATION: 8 weeks   PLANNED INTERVENTIONS: Therapeutic exercises, Therapeutic activity, Neuromuscular re-education, Balance training, Gait training, Patient/Family education, Self Care, Joint mobilization, Aquatic Therapy, Dry Needling, Electrical stimulation, Cryotherapy, Moist heat, Taping, Vasopneumatic device, Manual therapy, and Re-evaluation   PLAN FOR NEXT SESSION: assess HEP response, knee taping, quad and hip abd strengthening   Eloy End, PT 06/16/2023, 11:33 AM

## 2023-06-16 ENCOUNTER — Encounter: Payer: Self-pay | Admitting: Nurse Practitioner

## 2023-06-16 ENCOUNTER — Ambulatory Visit: Payer: 59

## 2023-06-16 DIAGNOSIS — M1712 Unilateral primary osteoarthritis, left knee: Secondary | ICD-10-CM

## 2023-06-16 DIAGNOSIS — G43009 Migraine without aura, not intractable, without status migrainosus: Secondary | ICD-10-CM

## 2023-06-16 DIAGNOSIS — M1711 Unilateral primary osteoarthritis, right knee: Secondary | ICD-10-CM | POA: Diagnosis not present

## 2023-06-17 ENCOUNTER — Other Ambulatory Visit (HOSPITAL_COMMUNITY): Payer: Self-pay

## 2023-06-17 MED ORDER — AJOVY 225 MG/1.5ML ~~LOC~~ SOAJ
225.0000 mg | SUBCUTANEOUS | 3 refills | Status: DC
Start: 2023-06-17 — End: 2024-08-17
  Filled 2023-06-17 – 2023-07-13 (×3): qty 1.5, 30d supply, fill #0
  Filled 2023-08-28: qty 1.5, 30d supply, fill #1
  Filled 2023-09-18 – 2023-09-21 (×2): qty 1.5, 30d supply, fill #2

## 2023-06-18 ENCOUNTER — Other Ambulatory Visit (HOSPITAL_COMMUNITY): Payer: Self-pay

## 2023-06-18 ENCOUNTER — Ambulatory Visit: Payer: 59

## 2023-06-25 ENCOUNTER — Other Ambulatory Visit (HOSPITAL_COMMUNITY): Payer: Self-pay

## 2023-06-27 ENCOUNTER — Telehealth: Payer: Self-pay | Admitting: Internal Medicine

## 2023-06-27 NOTE — Telephone Encounter (Signed)
P.A for [employer] sent through covermymeds. Waiting on response

## 2023-06-30 NOTE — Telephone Encounter (Signed)
P.A. approved til 06/27/23, sent mychart message

## 2023-07-08 ENCOUNTER — Inpatient Hospital Stay: Payer: 59 | Attending: Hematology and Oncology

## 2023-07-08 ENCOUNTER — Other Ambulatory Visit: Payer: Self-pay

## 2023-07-08 ENCOUNTER — Other Ambulatory Visit: Payer: Self-pay | Admitting: Hematology and Oncology

## 2023-07-08 DIAGNOSIS — K922 Gastrointestinal hemorrhage, unspecified: Secondary | ICD-10-CM | POA: Diagnosis not present

## 2023-07-08 DIAGNOSIS — D500 Iron deficiency anemia secondary to blood loss (chronic): Secondary | ICD-10-CM

## 2023-07-08 LAB — CMP (CANCER CENTER ONLY)
ALT: 18 U/L (ref 0–44)
AST: 17 U/L (ref 15–41)
Albumin: 3.8 g/dL (ref 3.5–5.0)
Alkaline Phosphatase: 65 U/L (ref 38–126)
Anion gap: 7 (ref 5–15)
BUN: 12 mg/dL (ref 6–20)
CO2: 26 mmol/L (ref 22–32)
Calcium: 9.2 mg/dL (ref 8.9–10.3)
Chloride: 108 mmol/L (ref 98–111)
Creatinine: 0.76 mg/dL (ref 0.44–1.00)
GFR, Estimated: >60 mL/min (ref >60)
Glucose, Bld: 85 mg/dL (ref 70–99)
Potassium: 4.3 mmol/L (ref 3.5–5.1)
Sodium: 141 mmol/L (ref 135–145)
Total Bilirubin: 0.7 mg/dL (ref 0.3–1.2)
Total Protein: 7.2 g/dL (ref 6.5–8.1)

## 2023-07-08 LAB — CBC WITH DIFFERENTIAL (CANCER CENTER ONLY)
Abs Immature Granulocytes: 0.02 10*3/uL (ref 0.00–0.07)
Basophils Absolute: 0 10*3/uL (ref 0.0–0.1)
Basophils Relative: 0 %
Eosinophils Absolute: 0.2 10*3/uL (ref 0.0–0.5)
Eosinophils Relative: 2 %
HCT: 39.1 % (ref 36.0–46.0)
Hemoglobin: 12.2 g/dL (ref 12.0–15.0)
Immature Granulocytes: 0 %
Lymphocytes Relative: 26 %
Lymphs Abs: 2.3 10*3/uL (ref 0.7–4.0)
MCH: 28.8 pg (ref 26.0–34.0)
MCHC: 31.2 g/dL (ref 30.0–36.0)
MCV: 92.2 fL (ref 80.0–100.0)
Monocytes Absolute: 0.7 10*3/uL (ref 0.1–1.0)
Monocytes Relative: 8 %
Neutro Abs: 5.7 10*3/uL (ref 1.7–7.7)
Neutrophils Relative %: 64 %
Platelet Count: 343 10*3/uL (ref 150–400)
RBC: 4.24 MIL/uL (ref 3.87–5.11)
RDW: 13.2 % (ref 11.5–15.5)
WBC Count: 9 10*3/uL (ref 4.0–10.5)
nRBC: 0 % (ref 0.0–0.2)

## 2023-07-08 LAB — RETIC PANEL
Immature Retic Fract: 14.7 % (ref 2.3–15.9)
RBC.: 4.14 MIL/uL (ref 3.87–5.11)
Retic Count, Absolute: 95.2 10*3/uL (ref 19.0–186.0)
Retic Ct Pct: 2.3 % (ref 0.4–3.1)
Reticulocyte Hemoglobin: 31.5 pg (ref >27.9)

## 2023-07-08 LAB — IRON AND IRON BINDING CAPACITY (CC-WL,HP ONLY)
Iron: 52 ug/dL (ref 28–170)
Saturation Ratios: 14 % (ref 10.4–31.8)
TIBC: 365 ug/dL (ref 250–450)
UIBC: 313 ug/dL (ref 148–442)

## 2023-07-08 LAB — FERRITIN: Ferritin: 33 ng/mL (ref 11–307)

## 2023-07-13 ENCOUNTER — Other Ambulatory Visit (HOSPITAL_COMMUNITY): Payer: Self-pay

## 2023-07-14 ENCOUNTER — Encounter: Payer: Self-pay | Admitting: Hematology and Oncology

## 2023-07-14 ENCOUNTER — Other Ambulatory Visit (HOSPITAL_COMMUNITY): Payer: Self-pay

## 2023-07-15 ENCOUNTER — Inpatient Hospital Stay (HOSPITAL_BASED_OUTPATIENT_CLINIC_OR_DEPARTMENT_OTHER): Payer: 59 | Admitting: Hematology and Oncology

## 2023-07-15 VITALS — BP 137/77 | HR 64 | Temp 98.2°F | Resp 17 | Wt 277.0 lb

## 2023-07-15 DIAGNOSIS — D500 Iron deficiency anemia secondary to blood loss (chronic): Secondary | ICD-10-CM

## 2023-07-15 DIAGNOSIS — D75839 Thrombocytosis, unspecified: Secondary | ICD-10-CM | POA: Diagnosis not present

## 2023-07-15 DIAGNOSIS — K922 Gastrointestinal hemorrhage, unspecified: Secondary | ICD-10-CM | POA: Diagnosis not present

## 2023-07-15 NOTE — Progress Notes (Signed)
[facility] [phone]   [phone]  PROGRESS NOTE  Patient Care Team: Early, Sung Amabile, NP as PCP - General (Nurse Practitioner)  Hematological/Oncological History # Iron Deficiency Anemia 2/2 GI Bleeding  10/01/2022: WBC 12.9, Hgb 10.3, MCV 78, Plt 463. Iron sat 4%, Ferritin 10 11/17/2022: establish care with Dr. Leonides Schanz  12/03/2022-12/30/2022: IV iron sucrose 200 mg x 5 doses.   Interval History:  Audrey [name] 37 y.o. female with medical history significant for iron deficiency anemia 2/2 GI bleeding who presents for a follow up visit. The patient's last visit was on 01/13/2023. In the interim since the last visit she has had no major changes in her health.  On exam today Mrs. Urdaneta reports she had a good 4 July.  She was able to enjoy fireworks and barbecue.  She notes that she has been well in the last 6 months.  Her energy levels have been steady, about a 6 out of 10.  She is feeling little sleepy today because she had a hard time falling asleep last night.  She reports that she is not currently taking any prescribed iron pills but is taking iron Gummies.  She reports that they cause less constipation.  She is doing this once per day.  She reports that her menstrual cycles are steady and there have been no changes.  She does have some occasional bouts of dizziness which she associates with her known vertigo.  Her weight has decreased as part of a diet challenge, her weight is now 277 pounds, down from 285 at the beginning of the year.  Otherwise she denies any fevers, chills, sweats, nausea, vomiting or diarrhea.  A full 10 point ROS was otherwise negative.  MEDICAL HISTORY:  Past Medical History:  Diagnosis Date   Arthritis    Atypical chest pain 10/03/2020   Common migraine with intractable migraine 01/23/2017   Diabetes mellitus without complication (HCC)    Fetal demise 05/19/2013   GERD (gastroesophageal reflux disease)    with pregnancy    Gestational diabetes    Headache    migraines   Hemorrhage after delivery of fetus 08/12/2006   Morbidly obese (HCC) 01/23/2017   Preeclampsia    Pregnancy induced hypertension    S/P cesarean section 05/20/2013   S/P cesarean section 11/18/2014    SURGICAL HISTORY: Past Surgical History:  Procedure Laterality Date   CESAREAN SECTION     CESAREAN SECTION N/A 05/19/2013   Procedure: CESAREAN SECTION;  Surgeon: Sherron Monday, MD;  Location: [facility];  Service: Obstetrics;  Laterality: N/A;   CESAREAN SECTION WITH BILATERAL TUBAL LIGATION Bilateral 11/17/2014   Procedure: CESAREAN SECTION WITH BILATERAL TUBAL LIGATION;  Surgeon: Sherian Rein, MD;  Location: [facility];  Service: Obstetrics;  Laterality: Bilateral;   TOENAIL EXCISION     WISDOM TOOTH EXTRACTION      SOCIAL HISTORY: Social History   Socioeconomic History   Marital status: Single    Spouse name: Not on file   Number of children: 1   Years of education: 12+   Highest education level: Not on file  Occupational History    Employer: [employer]  Tobacco Use   Smoking status: Never   Smokeless tobacco: Never  Vaping Use   Vaping status: Never Used  Substance and Sexual Activity   Alcohol use: No   Drug use: No   Sexual activity: Not Currently  Other Topics Concern   Not on file  Social History Narrative   Patient  lives at home with daughter.    Patient has has 1 child and one on the way.    Patient has some college education.    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          Social Determinants of Corporate investment banker Strain: Not on file  Food Insecurity: Not on file  Transportation Needs: Not on file  Physical Activity: Not on file  Stress: Not on file  Social Connections: Not on file  Intimate Partner Violence: Not on file    FAMILY HISTORY: Family History  Adopted: Yes  Problem Relation Age of Onset   Diabetes Mother    Asthma Other    Hyperlipidemia Other    Obesity Other    Diabetes Maternal  Grandmother    Hypertension Maternal Grandmother    Diabetes Paternal Grandmother    Hypertension Paternal Grandmother     ALLERGIES:  is allergic to sumatriptan.  MEDICATIONS:  Current Outpatient Medications  Medication Sig Dispense Refill   amoxicillin (AMOXIL) 500 MG capsule Take 1 capsule (500 mg total) by mouth 2 (two) times daily. May refill if mucoid cough returns. 14 capsule 2   azelastine (ASTELIN) 0.1 % nasal spray Place 2 sprays into each nostril 2 (two) times daily as directed. 30 mL 12   dicyclomine (BENTYL) 10 MG capsule Take 1 capsule (10 mg total) by mouth 4 (four) times daily - before meals and at bedtime. 90 capsule 2   Ferric Maltol (ACCRUFER) 30 MG CAPS Take 1 tablet by mouth daily. 30 capsule 5   Fremanezumab-vfrm (AJOVY) 225 MG/1.5ML SOAJ Inject 225 mg into the skin every 30 (thirty) days. 1.5 mL 3   ibuprofen (ADVIL) 200 MG tablet Take 200 mg by mouth every 6 (six) hours as needed.     ketoconazole (NIZORAL) 2 % shampoo Apply 1 Application topically 2 (two) times a week. 120 mL 5   levocetirizine (XYZAL) 5 MG tablet Take 1 tablet (5 mg total) by mouth every evening. 90 tablet 3   meclizine (ANTIVERT) 12.5 MG tablet Take 1 tablet (12.5 mg total) by mouth 3 (three) times daily as needed for dizziness. 30 tablet 0   nystatin-triamcinolone (MYCOLOG II) cream Apply 1 Application topically 2 (two) times daily. 60 g 1   ondansetron (ZOFRAN) 4 MG tablet Take 1 tablet (4 mg total) by mouth daily as needed for nausea or vomiting. (Patient not taking: Reported on 01/12/2023) 30 tablet 1   pantoprazole (PROTONIX) 40 MG tablet Take 1 tablet (40 mg total) by mouth 2 (two) times daily. Drop down to once a day when symptoms controlled. 60 tablet 3   Semaglutide-Weight Management (WEGOVY) 0.5 MG/0.5ML SOAJ Inject 0.5 mg into the skin once a week. 6 mL 0   triamcinolone (NASACORT) 55 MCG/ACT AERO nasal inhaler Place 1 spray into the nose 2 (two) times daily. 1 Inhaler 2   valACYclovir  (VALTREX) 1000 MG tablet TAKE 2 TABLETS (2,000 MG TOTAL) BY MOUTH 2 (TWO) TIMES DAILY.     No current facility-administered medications for this visit.    REVIEW OF SYSTEMS:   Constitutional: ( - ) fevers, ( - )  chills , ( - ) night sweats Eyes: ( - ) blurriness of vision, ( - ) double vision, ( - ) watery eyes Ears, nose, mouth, throat, and face: ( - ) mucositis, ( - ) sore throat Respiratory: ( - ) cough, ( - ) dyspnea, ( - ) wheezes Cardiovascular: ( - ) palpitation, ( - )  chest discomfort, ( - ) lower extremity swelling Gastrointestinal:  ( - ) nausea, ( - ) heartburn, ( - ) change in bowel habits Skin: ( - ) abnormal skin rashes Lymphatics: ( - ) new lymphadenopathy, ( - ) easy bruising Neurological: ( - ) numbness, ( - ) tingling, ( - ) new weaknesses Behavioral/Psych: ( - ) mood change, ( - ) new changes  All other systems were reviewed with the patient and are negative.  PHYSICAL EXAMINATION:  There were no vitals filed for this visit.  There were no vitals filed for this visit.   GENERAL: Well-appearing middle-aged African-American female, alert, no distress and comfortable SKIN: skin color, texture, turgor are normal, no rashes or significant lesions EYES: conjunctiva are pink and non-injected, sclera clear LUNGS: clear to auscultation and percussion with normal breathing effort HEART: regular rate & rhythm and no murmurs and no lower extremity edema Musculoskeletal: no cyanosis of digits and no clubbing  PSYCH: alert & oriented x 3, fluent speech NEURO: no focal motor/sensory deficits  LABORATORY DATA:  I have reviewed the data as listed    Latest Ref Rng & Units 07/08/2023    9:59 AM 01/13/2023    9:55 AM 01/12/2023    2:56 PM  CBC  WBC 4.0 - 10.5 K/uL 9.0  11.2  11.1   Hemoglobin 12.0 - 15.0 g/dL 62.9  52.8  41.3   Hematocrit 36.0 - 46.0 % 39.1  39.1  38.8   Platelets 150 - 400 K/uL 343  354  371        Latest Ref Rng & Units 07/08/2023    9:59 AM 01/13/2023     9:55 AM 11/17/2022    2:40 PM  CMP  Glucose 70 - 99 mg/dL 85  85  83   BUN 6 - 20 mg/dL 12  10  11    Creatinine 0.44 - 1.00 mg/dL 2.44  0.10  2.72   Sodium 135 - 145 mmol/L 141  142  140   Potassium 3.5 - 5.1 mmol/L 4.3  4.0  3.9   Chloride 98 - 111 mmol/L 108  108  105   CO2 22 - 32 mmol/L 26  27  28    Calcium 8.9 - 10.3 mg/dL 9.2  9.0  9.2   Total Protein 6.5 - 8.1 g/dL 7.2  7.5  8.3   Total Bilirubin 0.3 - 1.2 mg/dL 0.7  0.6  0.7   Alkaline Phos 38 - 126 U/L 65  73  78   AST 15 - 41 U/L 17  17  18    ALT 0 - 44 U/L 18  19  19      RADIOGRAPHIC STUDIES: No results found.  ASSESSMENT & PLAN ELEESHA CRICHTON 37 y.o. female with medical history significant for iron deficiency anemia 2/2 GI bleeding who presents for a follow up visit.  After review of the labs, review of the records, and discussion with the patient the patients findings are most consistent with iron deficiency anemia secondary to GYN bleeding.   # Iron Deficiency Anemia 2/2 to GYN Bleeding -- Findings are consistent with iron deficiency anemia secondary to patient's menorrhagia --Encouraged her to follow-up with OB/GYN for better control of her menstrual cycles --Today we will repeat iron panel and ferritin as well as reticulocytes, CBC, and CMP --Continue over-the-counter iron Gummies.  Patient unable to tolerate iron pills. --We will plan to proceed with IV iron therapy if her iron levels are low again.  --Labs  today show white blood cell count 9.0, Hgb 12.2, MCV 92.2, Plt 343 --Plan for return to clinic as needed   # Leukocytosis, Neutrophilic Predominant -- Suspect this is secondary to her hidradenitis suppurativa. -- Continue to monitor.    No orders of the defined types were placed in this encounter.   All questions were answered. The patient knows to call the clinic with any problems, questions or concerns.  A total of more than 30 minutes were spent on this encounter with face-to-face time and  non-face-to-face time, including preparing to see the patient, ordering tests and/or medications, counseling the patient and coordination of care as outlined above.   Ulysees Barns, MD Department of Hematology/Oncology [facility] Phone: [phone] Pager: [phone] Email: [email or web address]  07/15/2023 7:29 AM

## 2023-07-18 ENCOUNTER — Encounter: Payer: Self-pay | Admitting: Hematology and Oncology

## 2023-07-24 ENCOUNTER — Other Ambulatory Visit (HOSPITAL_COMMUNITY): Payer: Self-pay

## 2023-07-30 ENCOUNTER — Encounter: Payer: Self-pay | Admitting: Nurse Practitioner

## 2023-07-31 ENCOUNTER — Other Ambulatory Visit: Payer: Self-pay | Admitting: Nurse Practitioner

## 2023-07-31 ENCOUNTER — Other Ambulatory Visit: Payer: Self-pay

## 2023-07-31 ENCOUNTER — Other Ambulatory Visit (HOSPITAL_COMMUNITY): Payer: Self-pay

## 2023-07-31 DIAGNOSIS — R051 Acute cough: Secondary | ICD-10-CM

## 2023-07-31 MED ORDER — PREDNISONE 20 MG PO TABS
40.0000 mg | ORAL_TABLET | Freq: Every day | ORAL | 0 refills | Status: DC
Start: 2023-07-31 — End: 2023-08-27
  Filled 2023-07-31: qty 10, 5d supply, fill #0

## 2023-07-31 MED ORDER — HYDROCODONE BIT-HOMATROP MBR 5-1.5 MG/5ML PO SOLN
5.0000 mL | Freq: Three times a day (TID) | ORAL | 0 refills | Status: DC | PRN
Start: 2023-07-31 — End: 2023-08-27
  Filled 2023-07-31: qty 120, 8d supply, fill #0

## 2023-08-03 ENCOUNTER — Encounter: Payer: Self-pay | Admitting: Nurse Practitioner

## 2023-08-25 ENCOUNTER — Other Ambulatory Visit (HOSPITAL_COMMUNITY): Payer: Self-pay

## 2023-08-27 ENCOUNTER — Encounter: Payer: Self-pay | Admitting: Nurse Practitioner

## 2023-08-27 ENCOUNTER — Other Ambulatory Visit (HOSPITAL_COMMUNITY): Payer: Self-pay

## 2023-08-27 ENCOUNTER — Telehealth (INDEPENDENT_AMBULATORY_CARE_PROVIDER_SITE_OTHER): Payer: 59 | Admitting: Nurse Practitioner

## 2023-08-27 VITALS — Wt 275.0 lb

## 2023-08-27 DIAGNOSIS — T753XXA Motion sickness, initial encounter: Secondary | ICD-10-CM

## 2023-08-27 DIAGNOSIS — R052 Subacute cough: Secondary | ICD-10-CM | POA: Diagnosis not present

## 2023-08-27 DIAGNOSIS — R051 Acute cough: Secondary | ICD-10-CM

## 2023-08-27 HISTORY — DX: Motion sickness, initial encounter: T75.3XXA

## 2023-08-27 MED ORDER — PREDNISONE 20 MG PO TABS
40.0000 mg | ORAL_TABLET | Freq: Every day | ORAL | 0 refills | Status: DC
Start: 2023-08-27 — End: 2023-10-02
  Filled 2023-08-27: qty 10, 5d supply, fill #0

## 2023-08-27 MED ORDER — HYDROCODONE BIT-HOMATROP MBR 5-1.5 MG/5ML PO SOLN
5.0000 mL | Freq: Three times a day (TID) | ORAL | 0 refills | Status: DC | PRN
  Filled 2023-08-27: qty 120, 8d supply, fill #0

## 2023-08-27 MED ORDER — SCOPOLAMINE 1 MG/3DAYS TD PT72
1.0000 | MEDICATED_PATCH | TRANSDERMAL | 1 refills | Status: DC
  Filled 2023-08-27: qty 10, 30d supply, fill #0

## 2023-08-27 MED ORDER — ALBUTEROL SULFATE HFA 108 (90 BASE) MCG/ACT IN AERS
1.0000 | INHALATION_SPRAY | Freq: Four times a day (QID) | RESPIRATORY_TRACT | 1 refills | Status: DC | PRN
Start: 2023-08-27 — End: 2023-12-11
  Filled 2023-08-27: qty 6.7, 25d supply, fill #0
  Filled 2023-11-24: qty 6.7, 25d supply, fill #1

## 2023-08-27 MED ORDER — AZITHROMYCIN 250 MG PO TABS
ORAL_TABLET | ORAL | 0 refills | Status: AC
Start: 2023-08-27 — End: 2023-09-01
  Filled 2023-08-27: qty 11, 5d supply, fill #0

## 2023-08-27 NOTE — Assessment & Plan Note (Signed)
Cough and wheezing for a few weeks with no improvement despite conservative treatment. Suspect bacterial etiology at this time. No alarm symptoms present.  Plan: - Prednisone burst for 5 days - Azithromycin with extended treatment offered to have available while she is on her trip in the event her symptoms do not improve with the first dose.  - Hycodan for night time cough - Albuterol inhaler - Increase water intake and rest as much as possible - Mucinex may be helpful during the day.

## 2023-08-27 NOTE — Patient Instructions (Signed)
Have an AMAZING time on your trip!!   I sent extra azithromycin in the event the first dose is not effective. You may stop taking on day 5 if your symptoms are gone, if not, continue to day 10.

## 2023-08-27 NOTE — Progress Notes (Signed)
Virtual Visit Encounter mychart visit.   I connected with  Audrey [name] on 08/27/23 at  8:15 AM EDT by secure video and audio telemedicine application. I verified that I am speaking with the correct person using two identifiers.   I introduced myself as a Publishing rights manager with the practice. The limitations of evaluation and management by telemedicine discussed with the patient and the availability of in person appointments. The patient expressed verbal understanding and consent to proceed.  Participating parties in this visit include: Myself and patient  The patient is: Patient Location: Other:  DMV I am: Provider Location: Office/Clinic Subjective:    CC and HPI: Audrey [name] is a 37 y.o. year old female presenting for new evaluation and treatment of cough. Patient reports the following: She tells  me she has had a cough on and off for several weeks. She reports the cough is deep and painful in the lungs. She does not think she has had a fever, but has not been checking this. She did have a couple prednisone left from her last illness and she took those and felt they helped significantly. She is not sleeping well as the cough is worse at night and keeping her up. She has been using OTC medications to help,but these have not been effective. She is leaving on Saturday for a cruise and is concerning about being ill while away. She has tested negative for COVID twice.   Past medical history, Surgical history, Family history not pertinant except as noted below, Social history, Allergies, and medications have been entered into the medical record, reviewed, and corrections made.   Review of Systems:  All review of systems negative except what is listed in the HPI  Objective:    Alert and oriented x 4 Hoarse Speaking in clear sentences with no shortness of breath. No distress.  Impression and Recommendations:    Problem List Items Addressed This Visit     Subacute cough - Primary     Cough and wheezing for a few weeks with no improvement despite conservative treatment. Suspect bacterial etiology at this time. No alarm symptoms present.  Plan: - Prednisone burst for 5 days - Azithromycin with extended treatment offered to have available while she is on her trip in the event her symptoms do not improve with the first dose.  - Hycodan for night time cough - Albuterol inhaler - Increase water intake and rest as much as possible - Mucinex may be helpful during the day.       Relevant Medications   azithromycin (ZITHROMAX) 250 MG tablet   HYDROcodone bit-homatropine (HYCODAN) 5-1.5 MG/5ML syrup   predniSONE (DELTASONE) 20 MG tablet   albuterol (VENTOLIN HFA) 108 (90 Base) MCG/ACT inhaler   Motion sickness    History of motion sickness with cruise coming up.  Plan: - Scopalomine applied the evening before you leave. Wear for 72 hours then replace with new patch on opposite side. - Mildly blurry vision and dry mouth may occur, this is normal.       Relevant Medications   scopolamine (TRANSDERM-SCOP) 1 MG/3DAYS   Other Visit Diagnoses     Acute cough           orders and follow up as documented in EMR I discussed the assessment and treatment plan with the patient. The patient was provided an opportunity to ask questions and all were answered. The patient agreed with the plan and demonstrated an understanding of the instructions.   The  patient was advised to call back or seek an in-person evaluation if the symptoms worsen or if the condition fails to improve as anticipated.  Follow-Up: prn  I provided 18 minutes of non-face-to-face interaction with this non face-to-face encounter including intake, same-day documentation, and chart review.   Tollie Eth, NP , DNP, AGNP-c [facility]

## 2023-08-27 NOTE — Assessment & Plan Note (Signed)
History of motion sickness with cruise coming up.  Plan: - Scopalomine applied the evening before you leave. Wear for 72 hours then replace with new patch on opposite side. - Mildly blurry vision and dry mouth may occur, this is normal.

## 2023-08-28 ENCOUNTER — Other Ambulatory Visit (HOSPITAL_COMMUNITY): Payer: Self-pay

## 2023-09-08 ENCOUNTER — Telehealth: Payer: 59 | Admitting: Nurse Practitioner

## 2023-09-18 ENCOUNTER — Ambulatory Visit (INDEPENDENT_AMBULATORY_CARE_PROVIDER_SITE_OTHER): Payer: 59 | Admitting: Nurse Practitioner

## 2023-09-18 ENCOUNTER — Encounter: Payer: Self-pay | Admitting: Nurse Practitioner

## 2023-09-18 ENCOUNTER — Ambulatory Visit: Payer: 59 | Admitting: Nurse Practitioner

## 2023-09-18 ENCOUNTER — Other Ambulatory Visit (HOSPITAL_COMMUNITY): Payer: Self-pay

## 2023-09-18 VITALS — BP 126/82 | HR 77 | Ht 68.5 in | Wt 282.6 lb

## 2023-09-18 DIAGNOSIS — Z6841 Body Mass Index (BMI) 40.0 and over, adult: Secondary | ICD-10-CM | POA: Diagnosis not present

## 2023-09-18 DIAGNOSIS — Z0000 Encounter for general adult medical examination without abnormal findings: Secondary | ICD-10-CM | POA: Diagnosis not present

## 2023-09-18 DIAGNOSIS — J4540 Moderate persistent asthma, uncomplicated: Secondary | ICD-10-CM | POA: Diagnosis not present

## 2023-09-18 DIAGNOSIS — E559 Vitamin D deficiency, unspecified: Secondary | ICD-10-CM | POA: Diagnosis not present

## 2023-09-18 DIAGNOSIS — K0889 Other specified disorders of teeth and supporting structures: Secondary | ICD-10-CM | POA: Diagnosis not present

## 2023-09-18 DIAGNOSIS — D500 Iron deficiency anemia secondary to blood loss (chronic): Secondary | ICD-10-CM | POA: Diagnosis not present

## 2023-09-18 DIAGNOSIS — E88819 Insulin resistance, unspecified: Secondary | ICD-10-CM | POA: Diagnosis not present

## 2023-09-18 MED ORDER — TRAMADOL HCL 50 MG PO TABS
50.0000 mg | ORAL_TABLET | Freq: Three times a day (TID) | ORAL | 0 refills | Status: AC | PRN
Start: 2023-09-18 — End: 2023-09-23
  Filled 2023-09-18: qty 15, 5d supply, fill #0

## 2023-09-18 MED ORDER — IBUPROFEN 800 MG PO TABS
800.0000 mg | ORAL_TABLET | Freq: Three times a day (TID) | ORAL | 0 refills | Status: DC | PRN
Start: 2023-09-18 — End: 2024-05-30
  Filled 2023-09-18: qty 30, 10d supply, fill #0

## 2023-09-18 NOTE — Progress Notes (Signed)
Shawna Clamp, DNP, AGNP-c [facility] [address] [place], [place] [address] Main Office [phone]  BP 126/82   Pulse 77   Ht 5' 8.5" (1.74 m)   Wt 282 lb 9.6 oz (128.2 kg)   LMP 09/01/2023   BMI 42.34 kg/m    Subjective:    Patient ID: Audrey [name], female    DOB: 1986-07-21, 37 y.o.   MRN: [id number]  HPI: Audrey [name] is a 37 y.o. female presenting on 09/18/2023 for comprehensive medical examination.   Current medical concerns include: Asthma- she has been having worsening asthma recently with use of her inhaler daily. She is waking in the middle of the night with coughing spells. We will trial Trelegy and Airsupra to see if we can improve control.   Pertinent items are noted in HPI.  IMMUNIZATIONS:   Flu Vaccine: Flu vaccine declined, patient will complete later Prevnar 13: Prevnar 13 N/A for this patient Prevnar 20: Prevnar 20 N/A for this patient Pneumovax 23: Pneumovax 23 N/A for this patient Vac Shingrix: Shingrix N/A for this patient HPV: N/A or Aged Out Tetanus: Tetanus completed in the last 10 years COVID: Declined today. Information on where to obtain the vaccine was provided.  RSV: No  HEALTH MAINTENANCE: Pap Smear HM Status: is up to date Mammogram HM Status: N/A Colon Cancer Screening HM Status: N/A Bone Density HM Status: N/A STI Testing HM Status: was declined  Lung CT HM Status: N/A  Concerns with vision, hearing, or dentition: No  Dietary Habits: Exercise:  Most Recent Depression Screen:     09/18/2023    8:07 AM 01/12/2023    1:49 PM 08/06/2022   10:38 AM 04/07/2022   12:06 PM 12/24/2020   12:38 PM  Depression screen PHQ 2/9  Decreased Interest 0 0 0 0 0  Down, Depressed, Hopeless 0 0 0 0 0  PHQ - 2 Score 0 0 0 0 0  Altered sleeping    0 1  Tired, decreased energy    0 2  Change in appetite    0 0  Feeling bad or failure about yourself     0 0  Trouble concentrating    0 0  Moving slowly or fidgety/restless     0 0  Suicidal thoughts    0 0  PHQ-9 Score    0 3  Difficult doing work/chores     Somewhat difficult   Most Recent Anxiety Screen:      No data to display         Most Recent Fall Screen:    09/18/2023    8:06 AM 01/12/2023    1:49 PM 10/01/2022    2:16 PM 08/06/2022   10:38 AM 11/03/2019   12:03 PM  Fall Risk   Falls in the past year? 0 0 0 0 0  Number falls in past yr: 0 0 0 0   Injury with Fall? 0 0 0 0   Risk for fall due to : No Fall Risks No Fall Risks No Fall Risks No Fall Risks   Follow up Falls evaluation completed Falls evaluation completed Falls evaluation completed Falls evaluation completed     Past medical history, surgical history, medications, allergies, family history and social history reviewed with patient today and changes made to appropriate areas of the chart.  Past Medical History:  Past Medical History:  Diagnosis Date   Arthritis    Atypical chest pain 10/03/2020   Common migraine with intractable  migraine 01/23/2017   Dandruff 03/24/2023   Diabetes mellitus without complication (HCC)    Encounter for annual physical exam 09/18/2023   Fetal demise 05/19/2013   GERD (gastroesophageal reflux disease)    with pregnancy   Gestational diabetes    Headache    migraines   Hemorrhage after delivery of fetus 08/12/2006   Morbidly obese (HCC) 01/23/2017   Motion sickness 08/27/2023   Posterior rhinorrhea 02/13/2023   Preeclampsia    Pregnancy induced hypertension    S/P cesarean section 05/20/2013   S/P cesarean section 11/18/2014   Subacute cough 03/24/2023   Medications:  Current Outpatient Medications on File Prior to Visit  Medication Sig   albuterol (VENTOLIN HFA) 108 (90 Base) MCG/ACT inhaler Inhale 1-2 puffs into the lungs every 6 (six) hours as needed for wheezing or shortness of breath.   azelastine (ASTELIN) 0.1 % nasal spray Place 2 sprays into each nostril 2 (two) times daily as directed.   dicyclomine (BENTYL) 10 MG capsule Take 1  capsule (10 mg total) by mouth 4 (four) times daily - before meals and at bedtime.   Ferric Maltol (ACCRUFER) 30 MG CAPS Take 1 tablet by mouth daily.   Fremanezumab-vfrm (AJOVY) 225 MG/1.5ML SOAJ Inject 225 mg into the skin every 30 (thirty) days.   ketoconazole (NIZORAL) 2 % shampoo Apply 1 Application topically 2 (two) times a week.   levocetirizine (XYZAL) 5 MG tablet Take 1 tablet (5 mg total) by mouth every evening.   meclizine (ANTIVERT) 12.5 MG tablet Take 1 tablet (12.5 mg total) by mouth 3 (three) times daily as needed for dizziness.   nystatin-triamcinolone (MYCOLOG II) cream Apply 1 Application topically 2 (two) times daily.   pantoprazole (PROTONIX) 40 MG tablet Take 1 tablet (40 mg total) by mouth 2 (two) times daily. Drop down to once a day when symptoms controlled.   predniSONE (DELTASONE) 20 MG tablet Take 2 tablets (40 mg total) by mouth daily with breakfast.   Semaglutide-Weight Management (WEGOVY) 0.5 MG/0.5ML SOAJ Inject 0.5 mg into the skin once a week.   valACYclovir (VALTREX) 1000 MG tablet TAKE 2 TABLETS (2,000 MG TOTAL) BY MOUTH 2 (TWO) TIMES DAILY.   amoxicillin (AMOXIL) 500 MG capsule Take 1 capsule (500 mg total) by mouth 2 (two) times daily. May refill if mucoid cough returns. (Patient not taking: Reported on 08/27/2023)   ondansetron (ZOFRAN) 4 MG tablet Take 1 tablet (4 mg total) by mouth daily as needed for nausea or vomiting. (Patient not taking: Reported on 01/12/2023)   triamcinolone (NASACORT) 55 MCG/ACT AERO nasal inhaler Place 1 spray into the nose 2 (two) times daily.   No current facility-administered medications on file prior to visit.   Surgical History:  Past Surgical History:  Procedure Laterality Date   CESAREAN SECTION     CESAREAN SECTION N/A 05/19/2013   Procedure: CESAREAN SECTION;  Surgeon: Sherron Monday, MD;  Location: [facility];  Service: Obstetrics;  Laterality: N/A;   CESAREAN SECTION WITH BILATERAL TUBAL LIGATION Bilateral 11/17/2014    Procedure: CESAREAN SECTION WITH BILATERAL TUBAL LIGATION;  Surgeon: Sherian Rein, MD;  Location: [facility];  Service: Obstetrics;  Laterality: Bilateral;   TOENAIL EXCISION     WISDOM TOOTH EXTRACTION     Allergies:  Allergies  Allergen Reactions   Sumatriptan Other (See Comments)    Nausea  Nausea  Nausea   Family History:  Family History  Adopted: Yes  Problem Relation Age of Onset   Diabetes Mother    Asthma  Other    Hyperlipidemia Other    Obesity Other    Diabetes Maternal Grandmother    Hypertension Maternal Grandmother    Diabetes Paternal Grandmother    Hypertension Paternal Grandmother        Objective:    BP 126/82   Pulse 77   Ht 5' 8.5" (1.74 m)   Wt 282 lb 9.6 oz (128.2 kg)   LMP 09/01/2023   BMI 42.34 kg/m   Wt Readings from Last 3 Encounters:  09/18/23 282 lb 9.6 oz (128.2 kg)  08/27/23 275 lb (124.7 kg)  07/15/23 277 lb (125.6 kg)    Physical Exam Vitals and nursing note reviewed.  Constitutional:      General: She is not in acute distress.    Appearance: Normal appearance. She is obese.  HENT:     Head: Normocephalic and atraumatic.     Right Ear: Tympanic membrane normal.     Left Ear: Tympanic membrane normal.     Nose: Nose normal.     Mouth/Throat:     Mouth: Mucous membranes are moist.     Pharynx: Oropharynx is clear.  Eyes:     Conjunctiva/sclera: Conjunctivae normal.  Neck:     Thyroid: Thyromegaly present.     Vascular: No carotid bruit.  Cardiovascular:     Rate and Rhythm: Normal rate and regular rhythm.     Pulses: Normal pulses.     Heart sounds: Normal heart sounds.  Pulmonary:     Effort: Pulmonary effort is normal.     Breath sounds: Wheezing present.  Abdominal:     General: Bowel sounds are normal. There is no distension.     Palpations: Abdomen is soft.     Tenderness: There is no abdominal tenderness. There is no right CVA tenderness, left CVA tenderness or guarding.  Musculoskeletal:        General:  Normal range of motion.     Cervical back: Neck supple. No rigidity or tenderness.     Right lower leg: No edema.     Left lower leg: No edema.  Lymphadenopathy:     Cervical: No cervical adenopathy.  Skin:    General: Skin is warm and dry.     Capillary Refill: Capillary refill takes less than 2 seconds.     Comments: Scattered skin tags on the neck and chest  Neurological:     Mental Status: She is alert and oriented to person, place, and time.     Sensory: No sensory deficit.     Motor: No weakness.     Coordination: Coordination normal.  Psychiatric:        Mood and Affect: Mood normal.        Behavior: Behavior normal.     Results for orders placed or performed in visit on 09/18/23  CBC with Differential/Platelet  Result Value Ref Range   WBC 10.5 3.4 - 10.8 x10E3/uL   RBC 4.30 3.77 - 5.28 x10E6/uL   Hemoglobin 12.2 11.1 - 15.9 g/dL   Hematocrit 16.1 09.6 - 46.6 %   MCV 92 79 - 97 fL   MCH 28.4 26.6 - 33.0 pg   MCHC 30.7 (L) 31.5 - 35.7 g/dL   RDW 04.5 40.9 - 81.1 %   Platelets 372 150 - 450 x10E3/uL   Neutrophils 69 Not Estab. %   Lymphs 22 Not Estab. %   Monocytes 7 Not Estab. %   Eos 2 Not Estab. %   Basos 0 Not Estab. %  Neutrophils Absolute 7.1 (H) 1.4 - 7.0 x10E3/uL   Lymphocytes Absolute 2.3 0.7 - 3.1 x10E3/uL   Monocytes Absolute 0.8 0.1 - 0.9 x10E3/uL   EOS (ABSOLUTE) 0.2 0.0 - 0.4 x10E3/uL   Basophils Absolute 0.0 0.0 - 0.2 x10E3/uL   Immature Granulocytes 0 Not Estab. %   Immature Grans (Abs) 0.0 0.0 - 0.1 x10E3/uL  CMP14+EGFR  Result Value Ref Range   Glucose 87 70 - 99 mg/dL   BUN 10 6 - 20 mg/dL   Creatinine, Ser 6.04 0.57 - 1.00 mg/dL   eGFR 540 >98 JX/BJY/7.82   BUN/Creatinine Ratio 15 9 - 23   Sodium 145 (H) 134 - 144 mmol/L   Potassium 4.6 3.5 - 5.2 mmol/L   Chloride 107 (H) 96 - 106 mmol/L   CO2 22 20 - 29 mmol/L   Calcium 8.8 8.7 - 10.2 mg/dL   Total Protein 6.8 6.0 - 8.5 g/dL   Albumin 3.8 (L) 3.9 - 4.9 g/dL   Globulin, Total 3.0  1.5 - 4.5 g/dL   Bilirubin Total 0.5 0.0 - 1.2 mg/dL   Alkaline Phosphatase 76 44 - 121 IU/L   AST 17 0 - 40 IU/L   ALT 22 0 - 32 IU/L  Hemoglobin A1c  Result Value Ref Range   Hgb A1c MFr Bld 4.9 4.8 - 5.6 %   Est. average glucose Bld gHb Est-mCnc 94 mg/dL  Lipid panel  Result Value Ref Range   Cholesterol, Total 157 100 - 199 mg/dL   Triglycerides 98 0 - 149 mg/dL   HDL 41 >95 mg/dL   VLDL Cholesterol Cal 18 5 - 40 mg/dL   LDL Chol Calc (NIH) 98 0 - 99 mg/dL   Chol/HDL Ratio 3.8 0.0 - 4.4 ratio  TSH  Result Value Ref Range   TSH 1.170 0.450 - 4.500 uIU/mL  Iron, TIBC and Ferritin Panel  Result Value Ref Range   Total Iron Binding Capacity 330 250 - 450 ug/dL   UIBC 621 308 - 657 ug/dL   Iron 29 27 - 846 ug/dL   Iron Saturation 9 (LL) 15 - 55 %   Ferritin 28 15 - 150 ng/mL  Insulin, Free and Total  Result Value Ref Range   Free Insulin WILL FOLLOW    Total Insulin WILL FOLLOW   VITAMIN D 25 Hydroxy (Vit-D Deficiency, Fractures)  Result Value Ref Range   Vit D, 25-Hydroxy 28.3 (L) 30.0 - 100.0 ng/mL         Assessment & Plan:   Problem List Items Addressed This Visit     Class 3 severe obesity with body mass index (BMI) of 40.0 to 44.9 in adult [facility])    Chronic. Labs pending today. Diet and exercise alone have not been effective. Previously on wegovy, which was beneficial but unable to continue due to insurance.  - Diet and exercise recommendations provided - Will continue to monitor. - Consider restarting GLP-1 for reduction of risks if insurance will cover again.       Relevant Orders   CBC with Differential/Platelet (Completed)   CMP14+EGFR (Completed)   Hemoglobin A1c (Completed)   Lipid panel (Completed)   TSH (Completed)   Iron, TIBC and Ferritin Panel (Completed)   Insulin, Free and Total (Completed)   Iron deficiency anemia due to chronic blood loss    Labs today for monitoring.       Relevant Orders   CBC with Differential/Platelet (Completed)    CMP14+EGFR (Completed)   Hemoglobin A1c (  Completed)   Lipid panel (Completed)   TSH (Completed)   Iron, TIBC and Ferritin Panel (Completed)   Insulin, Free and Total (Completed)   Insulin resistance    Chronic. In the setting of elevated BMI, I am concerned for her CV risks in the future. I strongly recommend diet and exercise management. Avoidance of skipping meals is crucial. Labs pending.       Relevant Orders   CBC with Differential/Platelet (Completed)   CMP14+EGFR (Completed)   Hemoglobin A1c (Completed)   Lipid panel (Completed)   TSH (Completed)   Iron, TIBC and Ferritin Panel (Completed)   Insulin, Free and Total (Completed)   Encounter for annual physical exam - Primary    CPE completed today. Review of HM activities and recommendations discussed and provided on AVS. Anticipatory guidance, diet, and exercise recommendations provided. Medications, allergies, and hx reviewed and updated as necessary. Orders placed as listed below.  Plan: - Labs ordered. Will make changes as necessary based on results.  - I will review these results and send recommendations via MyChart or a telephone call.  - F/U with CPE in 1 year or sooner for acute/chronic health needs as directed.        Relevant Orders   CBC with Differential/Platelet (Completed)   CMP14+EGFR (Completed)   Hemoglobin A1c (Completed)   Lipid panel (Completed)   TSH (Completed)   Iron, TIBC and Ferritin Panel (Completed)   Insulin, Free and Total (Completed)   Moderate persistent asthma without complication    Asthma symptoms worsening recently likely due to changes in weather and allergens present. Given the current symptoms, we discussed alternative options that may be helpful. Based on current guidelines, sample of Airsupra provided. Trelegy sample also provided for daily control. If her symptoms improve with use, will send prescription to the pharmacy.       Other Visit Diagnoses     Pain in tooth        Relevant Medications   ibuprofen (ADVIL) 800 MG tablet   Vitamin D deficiency       Relevant Orders   VITAMIN D 25 Hydroxy (Vit-D Deficiency, Fractures) (Completed)          Follow up plan: Return in about 1 year (around 09/17/2024) for CPE.  NEXT PREVENTATIVE PHYSICAL DUE IN 1 YEAR.  PATIENT COUNSELING PROVIDED FOR ALL ADULT PATIENTS: A well balanced diet low in saturated fats, cholesterol, and moderation in carbohydrates.  This can be as simple as monitoring portion sizes and cutting back on sugary beverages such as soda and juice to start with.    Daily water consumption of at least 64 ounces.  Physical activity at least 180 minutes per week.  If just starting out, start 10 minutes a day and work your way up.   This can be as simple as taking the stairs instead of the elevator and walking 2-3 laps around the office  purposefully every day.   STD protection, partner selection, and regular testing if high risk.  Limited consumption of alcoholic beverages if alcohol is consumed. For men, I recommend no more than 14 alcoholic beverages per week, spread out throughout the week (max 2 per day). Avoid "binge" drinking or consuming large quantities of alcohol in one setting.  Please let me know if you feel you may need help with reduction or quitting alcohol consumption.   Avoidance of nicotine, if used. Please let me know if you feel you may need help with reduction or quitting nicotine use.  Daily mental health attention. This can be in the form of 5 minute daily meditation, prayer, journaling, yoga, reflection, etc.  Purposeful attention to your emotions and mental state can significantly improve your overall wellbeing  and  Health.  Please know that I am here to help you with all of your health care goals and am happy to work with you to find a solution that works best for you.  The greatest advice I have received with any changes in life are to take it one step at a time,  that even means if all you can focus on is the next 60 seconds, then do that and celebrate your victories.  With any changes in life, you will have set backs, and that is OK. The important thing to remember is, if you have a set back, it is not a failure, it is an opportunity to try again! Screening Testing Mammogram Every 1 -2 years based on history and risk factors Starting at age 79 Pap Smear Ages 21-39 every 3 years Ages 20-65 every 5 years with HPV testing More frequent testing may be required based on results and history Colon Cancer Screening Every 1-10 years based on test performed, risk factors, and history Starting at age 53 Bone Density Screening Every 2-10 years based on history Starting at age 60 for women Recommendations for men differ based on medication usage, history, and risk factors AAA Screening One time ultrasound Men 77-36 years old who have every smoked Lung Cancer Screening Low Dose Lung CT every 12 months Age 3-80 years with a 30 pack-year smoking history who still smoke or who have quit within the last 15 years   Screening Labs Routine  Labs: Complete Blood Count (CBC), Complete Metabolic Panel (CMP), Cholesterol (Lipid Panel) Every 6-12 months based on history and medications May be recommended more frequently based on current conditions or previous results Hemoglobin A1c Lab Every 3-12 months based on history and previous results Starting at age 38 or earlier with diagnosis of diabetes, high cholesterol, BMI >26, and/or risk factors Frequent monitoring for patients with diabetes to ensure blood sugar control Thyroid Panel (TSH) Every 6 months based on history, symptoms, and risk factors May be repeated more often if on medication HIV One time testing for all patients 16 and older May be repeated more frequently for patients with increased risk factors or exposure Hepatitis C One time testing for all patients 47 and older May be repeated more  frequently for patients with increased risk factors or exposure Gonorrhea, Chlamydia Every 12 months for all sexually active persons 13-24 years Additional monitoring may be recommended for those who are considered high risk or who have symptoms Every 12 months for any woman on birth control, regardless of sexual activity PSA Men 79-47 years old with risk factors Additional screening may be recommended from age 5-69 based on risk factors, symptoms, and history  Vaccine Recommendations Tetanus Booster All adults every 10 years Flu Vaccine All patients 6 months and older every year COVID Vaccine All patients 12 years and older Initial dosing with booster May recommend additional booster based on age and health history HPV Vaccine 2 doses all patients age 51-26 Dosing may be considered for patients over 26 Shingles Vaccine (Shingrix) 2 doses all adults 55 years and older Pneumonia (Pneumovax 31) All adults 65 years and older May recommend earlier dosing based on health history One year apart from Prevnar 89 Pneumonia (Prevnar 80) All adults 65 years and older Dosed 1  year after Pneumovax 23 Pneumonia (Prevnar 20) One time alternative to the two dosing of 13 and 23 For all adults with initial dose of 23, 20 is recommended 1 year later For all adults with initial dose of 13, 23 is still recommended as second option 1 year later

## 2023-09-18 NOTE — Patient Instructions (Addendum)
Everything looks good today.   I have sent in pain medication for your tooth. Let me know if this starts to feel infected.      For all adult patients, I recommend A well balanced diet low in saturated fats, cholesterol, and moderation in carbohydrates.   This can be as simple as monitoring portion sizes and cutting back on sugary beverages such as soda and juice to start with.    Daily water consumption of at least 64 ounces.  Physical activity at least 180 minutes per week, if just starting out.   This can be as simple as taking the stairs instead of the elevator and walking 2-3 laps around the office  purposefully every day.   STD protection, partner selection, and regular testing if high risk.  Limited consumption of alcoholic beverages if alcohol is consumed.  For women, I recommend no more than 7 alcoholic beverages per week, spread out throughout the week.  Avoid "binge" drinking or consuming large quantities of alcohol in one setting.   Please let me know if you feel you may need help with reduction or quitting alcohol consumption.   Avoidance of nicotine, if used.  Please let me know if you feel you may need help with reduction or quitting nicotine use.   Daily mental health attention.  This can be in the form of 5 minute daily meditation, prayer, journaling, yoga, reflection, etc.   Purposeful attention to your emotions and mental state can significantly improve your overall wellbeing  and  Health.  Please know that I am here to help you with all of your health care goals and am happy to work with you to find a solution that works best for you.  The greatest advice I have received with any changes in life are to take it one step at a time, that even means if all you can focus on is the next 60 seconds, then do that and celebrate your victories.  With any changes in life, you will have set backs, and that is OK. The important thing to remember is, if you have a set back, it  is not a failure, it is an opportunity to try again!  Health Maintenance Recommendations Screening Testing Mammogram Every 1 -2 years based on history and risk factors Starting at age 25 Pap Smear Ages 21-39 every 3 years Ages 55-65 every 5 years with HPV testing More frequent testing may be required based on results and history Colon Cancer Screening Every 1-10 years based on test performed, risk factors, and history Starting at age 93 Bone Density Screening Every 2-10 years based on history Starting at age 57 for women Recommendations for men differ based on medication usage, history, and risk factors AAA Screening One time ultrasound Men 70-68 years old who have every smoked Lung Cancer Screening Low Dose Lung CT every 12 months Age 58-80 years with a 30 pack-year smoking history who still smoke or who have quit within the last 15 years  Screening Labs Routine  Labs: Complete Blood Count (CBC), Complete Metabolic Panel (CMP), Cholesterol (Lipid Panel) Every 6-12 months based on history and medications May be recommended more frequently based on current conditions or previous results Hemoglobin A1c Lab Every 3-12 months based on history and previous results Starting at age 20 or earlier with diagnosis of diabetes, high cholesterol, BMI >26, and/or risk factors Frequent monitoring for patients with diabetes to ensure blood sugar control Thyroid Panel (TSH w/ T3 & T4) Every 6  months based on history, symptoms, and risk factors May be repeated more often if on medication HIV One time testing for all patients 62 and older May be repeated more frequently for patients with increased risk factors or exposure Hepatitis C One time testing for all patients 27 and older May be repeated more frequently for patients with increased risk factors or exposure Gonorrhea, Chlamydia Every 12 months for all sexually active persons 13-24 years Additional monitoring may be recommended for  those who are considered high risk or who have symptoms PSA Men 61-39 years old with risk factors Additional screening may be recommended from age 13-69 based on risk factors, symptoms, and history  Vaccine Recommendations Tetanus Booster All adults every 10 years Flu Vaccine All patients 6 months and older every year COVID Vaccine All patients 12 years and older Initial dosing with booster May recommend additional booster based on age and health history HPV Vaccine 2 doses all patients age 50-26 Dosing may be considered for patients over 26 Shingles Vaccine (Shingrix) 2 doses all adults 55 years and older Pneumonia (Pneumovax 23) All adults 65 years and older May recommend earlier dosing based on health history Pneumonia (Prevnar 66) All adults 65 years and older Dosed 1 year after Pneumovax 23  Additional Screening, Testing, and Vaccinations may be recommended on an individualized basis based on family history, health history, risk factors, and/or exposure.

## 2023-09-19 LAB — CBC WITH DIFFERENTIAL/PLATELET
Basophils Absolute: 0 10*3/uL (ref 0.0–0.2)
Basos: 0 %
EOS (ABSOLUTE): 0.2 10*3/uL (ref 0.0–0.4)
Eos: 2 %
Hematocrit: 39.7 % (ref 34.0–46.6)
Hemoglobin: 12.2 g/dL (ref 11.1–15.9)
Immature Grans (Abs): 0 10*3/uL (ref 0.0–0.1)
Immature Granulocytes: 0 %
Lymphocytes Absolute: 2.3 10*3/uL (ref 0.7–3.1)
Lymphs: 22 %
MCH: 28.4 pg (ref 26.6–33.0)
MCHC: 30.7 g/dL — ABNORMAL LOW (ref 31.5–35.7)
MCV: 92 fL (ref 79–97)
Monocytes Absolute: 0.8 10*3/uL (ref 0.1–0.9)
Monocytes: 7 %
Neutrophils Absolute: 7.1 10*3/uL — ABNORMAL HIGH (ref 1.4–7.0)
Neutrophils: 69 %
Platelets: 372 10*3/uL (ref 150–450)
RBC: 4.3 x10E6/uL (ref 3.77–5.28)
RDW: 12.5 % (ref 11.7–15.4)
WBC: 10.5 10*3/uL (ref 3.4–10.8)

## 2023-09-19 LAB — CMP14+EGFR
ALT: 22 IU/L (ref 0–32)
AST: 17 IU/L (ref 0–40)
Albumin: 3.8 g/dL — ABNORMAL LOW (ref 3.9–4.9)
Alkaline Phosphatase: 76 IU/L (ref 44–121)
BUN/Creatinine Ratio: 15 (ref 9–23)
BUN: 10 mg/dL (ref 6–20)
Bilirubin Total: 0.5 mg/dL (ref 0.0–1.2)
CO2: 22 mmol/L (ref 20–29)
Calcium: 8.8 mg/dL (ref 8.7–10.2)
Chloride: 107 mmol/L — ABNORMAL HIGH (ref 96–106)
Creatinine, Ser: 0.66 mg/dL (ref 0.57–1.00)
Globulin, Total: 3 g/dL (ref 1.5–4.5)
Glucose: 87 mg/dL (ref 70–99)
Potassium: 4.6 mmol/L (ref 3.5–5.2)
Sodium: 145 mmol/L — ABNORMAL HIGH (ref 134–144)
Total Protein: 6.8 g/dL (ref 6.0–8.5)
eGFR: 115 mL/min/{1.73_m2} (ref >59)

## 2023-09-19 LAB — INSULIN, FREE AND TOTAL

## 2023-09-19 LAB — LIPID PANEL
Chol/HDL Ratio: 3.8 ratio (ref 0.0–4.4)
Cholesterol, Total: 157 mg/dL (ref 100–199)
HDL: 41 mg/dL (ref >39)
LDL Chol Calc (NIH): 98 mg/dL (ref 0–99)
Triglycerides: 98 mg/dL (ref 0–149)
VLDL Cholesterol Cal: 18 mg/dL (ref 5–40)

## 2023-09-19 LAB — VITAMIN D 25 HYDROXY (VIT D DEFICIENCY, FRACTURES): Vit D, 25-Hydroxy: 28.3 ng/mL — ABNORMAL LOW (ref 30.0–100.0)

## 2023-09-19 LAB — IRON,TIBC AND FERRITIN PANEL
Ferritin: 28 ng/mL (ref 15–150)
Iron Saturation: 9 % — CL (ref 15–55)
Iron: 29 ug/dL (ref 27–159)
Total Iron Binding Capacity: 330 ug/dL (ref 250–450)
UIBC: 301 ug/dL (ref 131–425)

## 2023-09-19 LAB — HEMOGLOBIN A1C
Est. average glucose Bld gHb Est-mCnc: 94 mg/dL
Hgb A1c MFr Bld: 4.9 % (ref 4.8–5.6)

## 2023-09-19 LAB — TSH: TSH: 1.17 u[IU]/mL (ref 0.450–4.500)

## 2023-09-25 ENCOUNTER — Other Ambulatory Visit (HOSPITAL_COMMUNITY): Payer: Self-pay

## 2023-09-25 ENCOUNTER — Encounter: Payer: Self-pay | Admitting: Nurse Practitioner

## 2023-09-25 DIAGNOSIS — Z8639 Personal history of other endocrine, nutritional and metabolic disease: Secondary | ICD-10-CM

## 2023-09-25 DIAGNOSIS — J4540 Moderate persistent asthma, uncomplicated: Secondary | ICD-10-CM | POA: Insufficient documentation

## 2023-09-25 DIAGNOSIS — E161 Other hypoglycemia: Secondary | ICD-10-CM

## 2023-09-25 DIAGNOSIS — E168 Other specified disorders of pancreatic internal secretion: Secondary | ICD-10-CM

## 2023-09-25 MED ORDER — VITAMIN D (ERGOCALCIFEROL) 1.25 MG (50000 UNIT) PO CAPS
50000.0000 [IU] | ORAL_CAPSULE | ORAL | 3 refills | Status: DC
Start: 2023-09-25 — End: 2024-09-28
  Filled 2023-09-25: qty 12, 84d supply, fill #0
  Filled 2024-09-17: qty 12, 84d supply, fill #1

## 2023-09-25 NOTE — Assessment & Plan Note (Signed)

## 2023-09-25 NOTE — Assessment & Plan Note (Signed)
Chronic. Labs pending today. Diet and exercise alone have not been effective. Previously on wegovy, which was beneficial but unable to continue due to insurance.  - Diet and exercise recommendations provided - Will continue to monitor. - Consider restarting GLP-1 for reduction of risks if insurance will cover again.

## 2023-09-25 NOTE — Assessment & Plan Note (Signed)
Chronic. In the setting of elevated BMI, I am concerned for her CV risks in the future. I strongly recommend diet and exercise management. Avoidance of skipping meals is crucial. Labs pending.

## 2023-09-25 NOTE — Assessment & Plan Note (Signed)
Asthma symptoms worsening recently likely due to changes in weather and allergens present. Given the current symptoms, we discussed alternative options that may be helpful. Based on current guidelines, sample of Airsupra provided. Trelegy sample also provided for daily control. If her symptoms improve with use, will send prescription to the pharmacy.

## 2023-09-25 NOTE — Assessment & Plan Note (Signed)
Labs today for monitoring.

## 2023-09-30 ENCOUNTER — Encounter: Payer: Self-pay | Admitting: Nurse Practitioner

## 2023-09-30 ENCOUNTER — Other Ambulatory Visit (HOSPITAL_COMMUNITY): Payer: Self-pay

## 2023-09-30 MED ORDER — FLUCONAZOLE 150 MG PO TABS
150.0000 mg | ORAL_TABLET | Freq: Once | ORAL | 2 refills | Status: AC
  Filled 2023-09-30: qty 2, 3d supply, fill #0

## 2023-09-30 NOTE — Addendum Note (Signed)
Addended by: Nygel Prokop, Huntley Dec E on: 09/30/2023 01:32 PM   Modules accepted: Orders

## 2023-10-01 ENCOUNTER — Other Ambulatory Visit (HOSPITAL_COMMUNITY): Payer: Self-pay

## 2023-10-01 ENCOUNTER — Other Ambulatory Visit: Payer: Self-pay | Admitting: Nurse Practitioner

## 2023-10-01 DIAGNOSIS — L235 Allergic contact dermatitis due to other chemical products: Secondary | ICD-10-CM

## 2023-10-01 MED ORDER — MOMETASONE FUROATE 0.1 % EX CREA
1.0000 | TOPICAL_CREAM | Freq: Two times a day (BID) | CUTANEOUS | 1 refills | Status: AC
Start: 2023-10-01 — End: ?
  Filled 2023-10-01: qty 45, 30d supply, fill #0
  Filled 2024-02-02: qty 45, 30d supply, fill #1

## 2023-10-02 ENCOUNTER — Ambulatory Visit: Payer: 59 | Admitting: Nurse Practitioner

## 2023-10-02 ENCOUNTER — Other Ambulatory Visit: Payer: Self-pay | Admitting: Nurse Practitioner

## 2023-10-02 ENCOUNTER — Other Ambulatory Visit (HOSPITAL_COMMUNITY): Payer: Self-pay

## 2023-10-02 DIAGNOSIS — L235 Allergic contact dermatitis due to other chemical products: Secondary | ICD-10-CM

## 2023-10-02 MED ORDER — PREDNISONE 5 MG (21) PO TBPK
ORAL_TABLET | ORAL | 0 refills | Status: AC
Start: 2023-10-02 — End: 2023-10-07
  Filled 2023-10-02: qty 21, 6d supply, fill #0

## 2023-10-07 ENCOUNTER — Encounter (HOSPITAL_COMMUNITY): Payer: Self-pay

## 2023-10-07 ENCOUNTER — Ambulatory Visit (HOSPITAL_COMMUNITY)
Admission: RE | Admit: 2023-10-07 | Discharge: 2023-10-07 | Disposition: A | Discharge status: Home / Self Care | Payer: 59 | Source: Ambulatory Visit | Attending: Nurse Practitioner | Admitting: Nurse Practitioner

## 2023-10-07 DIAGNOSIS — Z8639 Personal history of other endocrine, nutritional and metabolic disease: Secondary | ICD-10-CM

## 2023-10-07 DIAGNOSIS — E161 Other hypoglycemia: Secondary | ICD-10-CM

## 2023-10-07 DIAGNOSIS — E168 Other specified disorders of pancreatic internal secretion: Secondary | ICD-10-CM

## 2023-10-12 ENCOUNTER — Encounter (HOSPITAL_COMMUNITY): Payer: Self-pay

## 2023-10-12 ENCOUNTER — Ambulatory Visit (HOSPITAL_COMMUNITY)
Admission: RE | Admit: 2023-10-12 | Discharge: 2023-10-12 | Disposition: A | Discharge status: Home / Self Care | Payer: 59 | Source: Ambulatory Visit | Attending: Nurse Practitioner | Admitting: Nurse Practitioner

## 2023-10-13 ENCOUNTER — Encounter: Payer: Self-pay | Admitting: Nurse Practitioner

## 2023-10-13 DIAGNOSIS — Z8639 Personal history of other endocrine, nutritional and metabolic disease: Secondary | ICD-10-CM

## 2023-10-13 DIAGNOSIS — E168 Other specified disorders of pancreatic internal secretion: Secondary | ICD-10-CM

## 2023-10-13 DIAGNOSIS — E161 Other hypoglycemia: Secondary | ICD-10-CM

## 2023-10-13 DIAGNOSIS — E88819 Insulin resistance, unspecified: Secondary | ICD-10-CM

## 2023-10-14 ENCOUNTER — Other Ambulatory Visit (HOSPITAL_COMMUNITY): Payer: Self-pay

## 2023-10-28 ENCOUNTER — Other Ambulatory Visit (HOSPITAL_COMMUNITY): Payer: 59

## 2023-10-28 ENCOUNTER — Ambulatory Visit (HOSPITAL_COMMUNITY)
Admission: RE | Admit: 2023-10-28 | Discharge: 2023-10-28 | Disposition: A | Discharge status: Home / Self Care | Payer: 59 | Source: Ambulatory Visit | Attending: Nurse Practitioner | Admitting: Nurse Practitioner

## 2023-10-28 DIAGNOSIS — E168 Other specified disorders of pancreatic internal secretion: Secondary | ICD-10-CM | POA: Diagnosis not present

## 2023-10-28 DIAGNOSIS — E88819 Insulin resistance, unspecified: Secondary | ICD-10-CM | POA: Insufficient documentation

## 2023-10-28 DIAGNOSIS — E161 Other hypoglycemia: Secondary | ICD-10-CM | POA: Insufficient documentation

## 2023-10-28 DIAGNOSIS — N281 Cyst of kidney, acquired: Secondary | ICD-10-CM | POA: Diagnosis not present

## 2023-10-28 DIAGNOSIS — Z8639 Personal history of other endocrine, nutritional and metabolic disease: Secondary | ICD-10-CM | POA: Diagnosis not present

## 2023-10-28 MED ORDER — IOHEXOL 300 MG/ML  SOLN
100.0000 mL | Freq: Once | INTRAMUSCULAR | Status: AC | PRN
  Administered 2023-10-28: 100 mL via INTRAVENOUS

## 2023-11-03 ENCOUNTER — Encounter: Payer: Self-pay | Admitting: Nurse Practitioner

## 2023-11-11 ENCOUNTER — Other Ambulatory Visit: Payer: Self-pay

## 2023-11-11 DIAGNOSIS — N858 Other specified noninflammatory disorders of uterus: Secondary | ICD-10-CM

## 2023-11-25 ENCOUNTER — Other Ambulatory Visit (HOSPITAL_COMMUNITY): Payer: Self-pay

## 2023-12-01 ENCOUNTER — Encounter: Payer: Self-pay | Admitting: Nurse Practitioner

## 2023-12-01 ENCOUNTER — Other Ambulatory Visit (HOSPITAL_COMMUNITY): Payer: Self-pay

## 2023-12-01 ENCOUNTER — Other Ambulatory Visit (HOSPITAL_BASED_OUTPATIENT_CLINIC_OR_DEPARTMENT_OTHER): Payer: Self-pay

## 2023-12-11 ENCOUNTER — Encounter: Payer: Self-pay | Admitting: Nurse Practitioner

## 2023-12-11 ENCOUNTER — Other Ambulatory Visit (HOSPITAL_COMMUNITY): Payer: Self-pay

## 2023-12-11 ENCOUNTER — Telehealth (INDEPENDENT_AMBULATORY_CARE_PROVIDER_SITE_OTHER): Payer: 59 | Admitting: Nurse Practitioner

## 2023-12-11 VITALS — Wt 278.0 lb

## 2023-12-11 DIAGNOSIS — D649 Anemia, unspecified: Secondary | ICD-10-CM | POA: Diagnosis not present

## 2023-12-11 DIAGNOSIS — R5383 Other fatigue: Secondary | ICD-10-CM | POA: Diagnosis not present

## 2023-12-11 DIAGNOSIS — J40 Bronchitis, not specified as acute or chronic: Secondary | ICD-10-CM | POA: Insufficient documentation

## 2023-12-11 DIAGNOSIS — B9689 Other specified bacterial agents as the cause of diseases classified elsewhere: Secondary | ICD-10-CM | POA: Insufficient documentation

## 2023-12-11 DIAGNOSIS — J069 Acute upper respiratory infection, unspecified: Secondary | ICD-10-CM | POA: Diagnosis not present

## 2023-12-11 DIAGNOSIS — F5101 Primary insomnia: Secondary | ICD-10-CM | POA: Insufficient documentation

## 2023-12-11 DIAGNOSIS — R052 Subacute cough: Secondary | ICD-10-CM

## 2023-12-11 MED ORDER — TRAZODONE HCL 50 MG PO TABS
25.0000 mg | ORAL_TABLET | Freq: Every evening | ORAL | 1 refills | Status: DC | PRN
Start: 2023-12-11 — End: 2024-01-11
  Filled 2023-12-11: qty 60, 30d supply, fill #0

## 2023-12-11 MED ORDER — ALBUTEROL SULFATE HFA 108 (90 BASE) MCG/ACT IN AERS
1.0000 | INHALATION_SPRAY | Freq: Four times a day (QID) | RESPIRATORY_TRACT | 1 refills | Status: AC | PRN
  Filled 2023-12-11: qty 6.7, 25d supply, fill #0

## 2023-12-11 MED ORDER — AMOXICILLIN-POT CLAVULANATE 875-125 MG PO TABS
1.0000 | ORAL_TABLET | Freq: Two times a day (BID) | ORAL | 0 refills | Status: DC
Start: 2023-12-11 — End: 2024-01-05
  Filled 2023-12-11: qty 20, 10d supply, fill #0

## 2023-12-11 MED ORDER — BUDESONIDE 0.25 MG/2ML IN SUSP
0.2500 mg | Freq: Every day | RESPIRATORY_TRACT | 12 refills | Status: AC
  Filled 2023-12-11: qty 30, 15d supply, fill #0

## 2023-12-11 MED ORDER — ALBUTEROL SULFATE (2.5 MG/3ML) 0.083% IN NEBU
2.5000 mg | INHALATION_SOLUTION | Freq: Four times a day (QID) | RESPIRATORY_TRACT | 3 refills | Status: AC | PRN
Start: 2023-12-11 — End: ?
  Filled 2023-12-11: qty 75, 7d supply, fill #0

## 2023-12-11 MED ORDER — PREDNISONE 20 MG PO TABS
20.0000 mg | ORAL_TABLET | Freq: Every day | ORAL | 0 refills | Status: DC
  Filled 2023-12-11: qty 5, 5d supply, fill #0

## 2023-12-11 NOTE — Assessment & Plan Note (Signed)
Chronic iron deficiency anemia with worsening fatigue and weakness. Shortness of breath related to bronchitis/URI, but may have a component associated with anemia, also. We have not checked for B12 deficiency or iron in a few months, but given the symptoms I feel that recheck is warranted. Will write lab orders and she will come next week to have these drawn

## 2023-12-11 NOTE — Progress Notes (Signed)
Virtual Visit Encounter mychart visit.   I connected with  Sherilyn Dacosta on 12/11/23 at  9:15 AM EST by secure video and audio telemedicine application. I verified that I am speaking with the correct person using two identifiers.   I introduced myself as a Publishing rights manager with the practice. The limitations of evaluation and management by telemedicine discussed with the patient and the availability of in person appointments. The patient expressed verbal understanding and consent to proceed.  Participating parties in this visit include: Myself and patient  The patient is: Patient Location: Other:  work I am: Provider Location: Office/Clinic Subjective:    CC and HPI: Audrey [name] is a 37 y.o. year old female presenting for new evaluation and treatment of cough.  Buffi reports onset of wheezing and productive cough with sore throat last week. After several days of symptoms she started a prescription of azithromycin she had on hand and her symptoms improved, over the last few days, however, she reports her symptoms have worsened and she is coughing worse, wheezing, and feels tightness in her chest. She reports coughing fits with productive mucous. 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   She also tells me that she has been having worsening fatigue and is concerned that her iron deficiency has gotten worse. She is still taking her vitamin D. She has not heard back from [facility] about her referral, but continues to have very heavy and painful periods.   She also tells me that she is not sleeping well at night with a chronic difficulty in falling asleep. She does not take anything for sleep at this time and has not tried prescription medication for this.   Past medical history, Surgical history, Family history not pertinant except as noted below, Social history, Allergies, and medications have been entered into the medical  record, reviewed, and corrections made.   Review of Systems:  All review of systems negative except what is listed in the HPI  Objective:    Alert and oriented x 4 Speaking in clear sentences with no distress.  Impression and Recommendations:    Problem List Items Addressed This Visit     Bacterial URI - Primary   Symptoms consistent with bacterial URI. We will begin treatment with a steroid burst and continue her inhaler. She has been having bronchitis episodes with weather changes which makes me suspect asthma as a component present.Sending in nebulizer for albuterol and ICS to help with this when symptoms start to hopefully prevent further exacerbations in the future. Consider PFT once her symptoms are not so severe.       Relevant Medications   predniSONE (DELTASONE) 20 MG tablet   amoxicillin-clavulanate (AUGMENTIN) 875-125 MG tablet   budesonide (PULMICORT) 0.25 MG/2ML nebulizer solution   Primary insomnia   Trial of trazodone for sleep management.       Relevant Medications   traZODone (DESYREL) 50 MG tablet   Anemia   Chronic iron deficiency anemia with worsening fatigue and weakness. Shortness of breath related to bronchitis/URI, but may have a component associated with anemia, also. We have not checked for B12 deficiency or iron in a few months, but given the symptoms I feel that recheck is warranted. Will write lab orders and she will come next week to have these drawn       Relevant Orders   Vitamin B12   CBC with Differential/Platelet   Iron,  TIBC and Ferritin Panel   VITAMIN D 25 Hydroxy (Vit-D Deficiency, Fractures)   Bronchitis   Relevant Medications   predniSONE (DELTASONE) 20 MG tablet   albuterol (PROVENTIL) (2.5 MG/3ML) 0.083% nebulizer solution   albuterol (VENTOLIN HFA) 108 (90 Base) MCG/ACT inhaler   budesonide (PULMICORT) 0.25 MG/2ML nebulizer solution   Other Relevant Orders   For home use only DME Nebulizer machine   Other Visit Diagnoses        Subacute cough       Relevant Medications   albuterol (PROVENTIL) (2.5 MG/3ML) 0.083% nebulizer solution   albuterol (VENTOLIN HFA) 108 (90 Base) MCG/ACT inhaler   budesonide (PULMICORT) 0.25 MG/2ML nebulizer solution   Other Relevant Orders   For home use only DME Nebulizer machine     Fatigue, unspecified type       Relevant Orders   TSH       orders and follow up as documented in EMR I discussed the assessment and treatment plan with the patient. The patient was provided an opportunity to ask questions and all were answered. The patient agreed with the plan and demonstrated an understanding of the instructions.   The patient was advised to call back or seek an in-person evaluation if the symptoms worsen or if the condition fails to improve as anticipated.  Follow-Up: next week for labs- she will call to schedule  I provided 17 minutes of non-face-to-face interaction with this non face-to-face encounter including intake, same-day documentation, and chart review.   Tollie Eth, NP , DNP, AGNP-c [facility]

## 2023-12-11 NOTE — Assessment & Plan Note (Signed)
Symptoms consistent with bacterial URI. We will begin treatment with a steroid burst and continue her inhaler. She has been having bronchitis episodes with weather changes which makes me suspect asthma as a component present.Sending in nebulizer for albuterol and ICS to help with this when symptoms start to hopefully prevent further exacerbations in the future. Consider PFT once her symptoms are not so severe.

## 2023-12-11 NOTE — Assessment & Plan Note (Signed)
Trial of trazodone for sleep management.

## 2023-12-12 ENCOUNTER — Encounter: Payer: Self-pay | Admitting: Nurse Practitioner

## 2023-12-15 ENCOUNTER — Other Ambulatory Visit: Payer: 59

## 2023-12-15 DIAGNOSIS — D649 Anemia, unspecified: Secondary | ICD-10-CM | POA: Diagnosis not present

## 2023-12-15 DIAGNOSIS — B9689 Other specified bacterial agents as the cause of diseases classified elsewhere: Secondary | ICD-10-CM

## 2023-12-15 DIAGNOSIS — R5383 Other fatigue: Secondary | ICD-10-CM | POA: Diagnosis not present

## 2023-12-16 LAB — CBC WITH DIFFERENTIAL/PLATELET
Basophils Absolute: 0.1 10*3/uL (ref 0.0–0.2)
Basos: 1 %
EOS (ABSOLUTE): 0.3 10*3/uL (ref 0.0–0.4)
Eos: 2 %
Hematocrit: 36.6 % (ref 34.0–46.6)
Hemoglobin: 11.2 g/dL (ref 11.1–15.9)
Immature Grans (Abs): 0.1 10*3/uL (ref 0.0–0.1)
Immature Granulocytes: 1 %
Lymphocytes Absolute: 3.1 10*3/uL (ref 0.7–3.1)
Lymphs: 24 %
MCH: 27.2 pg (ref 26.6–33.0)
MCHC: 30.6 g/dL — ABNORMAL LOW (ref 31.5–35.7)
MCV: 89 fL (ref 79–97)
Monocytes Absolute: 1.1 10*3/uL — ABNORMAL HIGH (ref 0.1–0.9)
Monocytes: 9 %
Neutrophils Absolute: 8 10*3/uL — ABNORMAL HIGH (ref 1.4–7.0)
Neutrophils: 63 %
Platelets: 341 10*3/uL (ref 150–450)
RBC: 4.12 x10E6/uL (ref 3.77–5.28)
RDW: 13.6 % (ref 11.7–15.4)
WBC: 12.6 10*3/uL — ABNORMAL HIGH (ref 3.4–10.8)

## 2023-12-16 LAB — IRON,TIBC AND FERRITIN PANEL
Ferritin: 31 ng/mL (ref 15–150)
Iron Saturation: 7 % — CL (ref 15–55)
Iron: 25 ug/dL — ABNORMAL LOW (ref 27–159)
Total Iron Binding Capacity: 349 ug/dL (ref 250–450)
UIBC: 324 ug/dL (ref 131–425)

## 2023-12-16 LAB — VITAMIN B12: Vitamin B-12: 420 pg/mL (ref 232–1245)

## 2023-12-16 LAB — TSH: TSH: 2.49 u[IU]/mL (ref 0.450–4.500)

## 2023-12-16 LAB — VITAMIN D 25 HYDROXY (VIT D DEFICIENCY, FRACTURES): Vit D, 25-Hydroxy: 20.7 ng/mL — ABNORMAL LOW (ref 30.0–100.0)

## 2023-12-22 ENCOUNTER — Other Ambulatory Visit (HOSPITAL_COMMUNITY): Payer: Self-pay

## 2023-12-22 ENCOUNTER — Other Ambulatory Visit: Payer: Self-pay | Admitting: Nurse Practitioner

## 2023-12-22 DIAGNOSIS — B9689 Other specified bacterial agents as the cause of diseases classified elsewhere: Secondary | ICD-10-CM

## 2023-12-24 ENCOUNTER — Encounter (HOSPITAL_COMMUNITY): Payer: Self-pay

## 2023-12-24 ENCOUNTER — Other Ambulatory Visit (HOSPITAL_COMMUNITY): Payer: Self-pay

## 2023-12-30 DIAGNOSIS — G4730 Sleep apnea, unspecified: Secondary | ICD-10-CM

## 2023-12-30 HISTORY — DX: Sleep apnea, unspecified: G47.30

## 2024-01-05 ENCOUNTER — Encounter: Payer: Self-pay | Admitting: Hematology and Oncology

## 2024-01-05 ENCOUNTER — Other Ambulatory Visit (HOSPITAL_COMMUNITY): Payer: Self-pay

## 2024-01-05 ENCOUNTER — Ambulatory Visit: Payer: 59 | Admitting: Nurse Practitioner

## 2024-01-05 ENCOUNTER — Encounter: Payer: Self-pay | Admitting: Nurse Practitioner

## 2024-01-05 VITALS — BP 122/80 | HR 92 | Wt 285.6 lb

## 2024-01-05 DIAGNOSIS — F5101 Primary insomnia: Secondary | ICD-10-CM | POA: Diagnosis not present

## 2024-01-05 DIAGNOSIS — B9689 Other specified bacterial agents as the cause of diseases classified elsewhere: Secondary | ICD-10-CM | POA: Diagnosis not present

## 2024-01-05 DIAGNOSIS — J4540 Moderate persistent asthma, uncomplicated: Secondary | ICD-10-CM | POA: Diagnosis not present

## 2024-01-05 DIAGNOSIS — J069 Acute upper respiratory infection, unspecified: Secondary | ICD-10-CM | POA: Diagnosis not present

## 2024-01-05 DIAGNOSIS — K2960 Other gastritis without bleeding: Secondary | ICD-10-CM

## 2024-01-05 MED ORDER — TRELEGY ELLIPTA 200-62.5-25 MCG/ACT IN AEPB
1.0000 | INHALATION_SPRAY | Freq: Every day | RESPIRATORY_TRACT | 6 refills | Status: DC
  Filled 2024-01-05: qty 60, 30d supply, fill #0

## 2024-01-05 MED ORDER — AMOXICILLIN-POT CLAVULANATE 875-125 MG PO TABS
1.0000 | ORAL_TABLET | Freq: Two times a day (BID) | ORAL | 3 refills | Status: DC
Start: 2024-01-05 — End: 2024-02-04
  Filled 2024-01-05: qty 20, 10d supply, fill #0

## 2024-01-05 NOTE — Patient Instructions (Addendum)
 I have sent a referral for breathing tests with pulmonology to evaluate your breathing.  I have sent in the Trelegy inhaler for you. Let me know if it is super expensive.   I want you to increase your dose of pantoprazole  take one in the morning and one at bedtime for 2 weeks then go back down to the once a day.   Please try the trazodone  tonight and see if this helps you sleep.

## 2024-01-05 NOTE — Progress Notes (Signed)
 Camie FORBES Doing, DNP, AGNP-c [facility] [address] [place], [place] [address] [phone]   ACUTE VISIT- ESTABLISHED PATIENT  Blood pressure 122/80, pulse 92, weight 285 lb 9.6 oz (129.5 kg), SpO2 94%.  Subjective:  HPI Audrey [name] is a 38 y.o. female presents to day for evaluation of acute concern(s).  History of Present Illness The patient, a  [employer] employee with a history of asthma, presents with worsening respiratory symptoms. She reports increased use of her nebulizer, particularly at night, due to a sensation of chest tightness and difficulty breathing. The patient describes the sensation as if something is stuck in her throat but denies any associated soreness or significant mucus production. She also reports a scratchy throat and a change in voice quality.  The patient's symptoms seem to exacerbate in different environments and when lying down. She has had to perform two nebulizer treatments in one night due to severe chest tightness. The patient also reports a drop in her oxygen saturation to 94%, a level she has not previously experienced.  In addition to her respiratory symptoms, the patient reports increased anxiety and stress, which she attributes to her work situation and financial concerns. She has been experiencing difficulty falling asleep, often not sleeping until 1 or 2 in the morning.  The patient also reports symptoms of gastroesophageal reflux disease (GERD), including indigestion and a sensation of congestion in her throat. She is currently on pantoprazole  but reports that her symptoms are still acting up.  The patient has recently completed a course of prednisone  and is unsure whether it or the nebulizer treatments are providing relief. She also reports using a sample of Trelegy inhaler, which she found helpful.  The patient's current concerns include the cause of her worsening asthma symptoms, the drop in her  oxygen saturation, and whether she is breathing adequately while sleeping. She expresses a desire for further testing to understand her condition better.   ROS negative except for what is listed in HPI. History, Medications, Surgery, SDOH, and Family History reviewed and updated as appropriate.  Objective:  Physical Exam Vitals and nursing note reviewed.  Constitutional:      General: She is not in acute distress.    Appearance: Normal appearance. She is not ill-appearing or toxic-appearing.  HENT:     Head: Normocephalic.     Mouth/Throat:     Mouth: Mucous membranes are moist.     Pharynx: Oropharynx is clear.     Tonsils: No tonsillar exudate. 3+ on the right. 3+ on the left.  Eyes:     Conjunctiva/sclera: Conjunctivae normal.     Pupils: Pupils are equal, round, and reactive to light.  Neck:     Vascular: No carotid bruit.  Cardiovascular:     Rate and Rhythm: Normal rate and regular rhythm.     Pulses: Normal pulses.     Heart sounds: Normal heart sounds.  Pulmonary:     Effort: Pulmonary effort is normal.     Breath sounds: Wheezing present.  Abdominal:     Palpations: Abdomen is soft.  Lymphadenopathy:     Cervical: No cervical adenopathy.  Skin:    General: Skin is warm and dry.     Capillary Refill: Capillary refill takes less than 2 seconds.  Neurological:     General: No focal deficit present.     Mental Status: She is alert and oriented to person, place, and time.     Cranial Nerves: No cranial nerve  deficit.     Sensory: No sensory deficit.     Motor: No weakness.  Psychiatric:        Mood and Affect: Mood normal.        Behavior: Behavior normal.          Assessment & Plan:   Problem List Items Addressed This Visit     Reflux gastritis   Persistent reflux and indigestion despite maximum pantoprazole  dose. Symptoms may contribute to respiratory issues. Discussed increasing pantoprazole  dosage and potential long-term side effects. - Increase  pantoprazole  to 40 mg twice daily for 2 weeks - Monitor for symptom improvement      Moderate persistent asthma without complication - Primary   Increased nebulizer use, nocturnal symptoms of chest tightness and pain. Oxygen saturation at 94%. Possible triggers include environmental factors and allergens. Differential includes asthma exacerbation, GERD, and infection. Discussed Trelegy inhaler benefits and side effects. Emphasized adherence. Need for pulmonary function tests to identify underlying cause. - Order pulmonary function tests - Prescribe Trelegy inhaler - Continue albuterol  as needed - Start Augmentin  for potential bacterial infection - Increase pantoprazole  to 40 mg twice daily for 2 weeks - Referral to pulmonology for further evaluation      Relevant Medications   Fluticasone -Umeclidin-Vilant (TRELEGY ELLIPTA ) 200-62.5-25 MCG/ACT AEPB   Other Relevant Orders   Ambulatory referral to Pulmonology   Bacterial URI   Increased nebulizer use, nocturnal symptoms of chest tightness and pain. Oxygen saturation at 94%. Possible triggers include environmental factors and allergens. Differential includes asthma exacerbation, GERD, and infection. Discussed Trelegy inhaler benefits and side effects. Emphasized adherence. Need for pulmonary function tests to identify underlying cause. - Order pulmonary function tests - Prescribe Trelegy inhaler - Continue albuterol  as needed - Start Augmentin  for potential bacterial infection - Increase pantoprazole  to 40 mg twice daily for 2 weeks - Referral to pulmonology for further evaluation      Relevant Medications   Fluticasone -Umeclidin-Vilant (TRELEGY ELLIPTA ) 200-62.5-25 MCG/ACT AEPB   amoxicillin -clavulanate (AUGMENTIN ) 875-125 MG tablet   Primary insomnia   Difficulty falling and staying asleep, with anxiety and stress as contributing factors. Hesitant to take trazodone  due to side effects. Discussed trazodone  and melatonin as  alternatives. - Recommend trazodone  50 mg, starting with half a dose 30 minutes before bedtime - Consider melatonin if trazodone  is not tolerated         Camie FORBES Doing, DNP, AGNP-c

## 2024-01-07 ENCOUNTER — Encounter: Payer: Self-pay | Admitting: Nurse Practitioner

## 2024-01-07 ENCOUNTER — Other Ambulatory Visit (HOSPITAL_COMMUNITY): Payer: Self-pay

## 2024-01-07 DIAGNOSIS — B9689 Other specified bacterial agents as the cause of diseases classified elsewhere: Secondary | ICD-10-CM

## 2024-01-07 DIAGNOSIS — J40 Bronchitis, not specified as acute or chronic: Secondary | ICD-10-CM

## 2024-01-07 MED ORDER — PREDNISONE 20 MG PO TABS
40.0000 mg | ORAL_TABLET | Freq: Every day | ORAL | 0 refills | Status: AC
  Filled 2024-01-07: qty 10, 5d supply, fill #0

## 2024-01-08 MED ORDER — HYDROCODONE BIT-HOMATROP MBR 5-1.5 MG/5ML PO SOLN
5.0000 mL | Freq: Every evening | ORAL | 0 refills | Status: DC | PRN
Start: 2024-01-08 — End: 2024-02-22

## 2024-01-11 ENCOUNTER — Telehealth: Payer: 59 | Admitting: Nurse Practitioner

## 2024-01-11 ENCOUNTER — Encounter: Payer: Self-pay | Admitting: Family Medicine

## 2024-01-11 ENCOUNTER — Ambulatory Visit: Payer: 59 | Admitting: Family Medicine

## 2024-01-11 VITALS — BP 130/88 | HR 89 | Temp 98.9°F | Ht 68.0 in | Wt 282.8 lb

## 2024-01-11 DIAGNOSIS — B9689 Other specified bacterial agents as the cause of diseases classified elsewhere: Secondary | ICD-10-CM | POA: Diagnosis not present

## 2024-01-11 DIAGNOSIS — J069 Acute upper respiratory infection, unspecified: Secondary | ICD-10-CM

## 2024-01-11 DIAGNOSIS — J4540 Moderate persistent asthma, uncomplicated: Secondary | ICD-10-CM

## 2024-01-11 NOTE — Progress Notes (Signed)
 Chief Complaint  Patient presents with   Cough    Saw SB last Tues for some breathing comcerns, pulm referral sent. Thursday cough was slight and by Friday full force. No fever, chills or body aches. Covid tests Fri and Sun both negative. Breathing still difficult.    1/7 she saw SaraBeth with shortness of breath (visit is still open, note not available).  She states she has been dealing with this for a few months.  She noted some hoarsness to her voice and swollen glands that day. The following day she woke up without a voice. Her cough started on 1/9, and was worse on 1/10. Cough is wet, unable to expectorate the phlegm.  She has been taking Augmentin  and prednisone  since last week (prednisone  40mg  x 5 days, has 2 pills left for tomorrow). She is feeling a little better. She has been using her nebulizer BID (albuterol ), for wheezing.  SaraBeth sent in Trelegy inhaler at her visit last week. She started it, but stopped using in on 1/10 when she started using the albuterol  nebulizer BID (wasn't sure if she could use both). She also put in a referral to pulmonology for PFT's. Hycodan syrup was ordered, but unfortunately went to print, didn't get sent to the pharmacy, so pt never got it. Cough is a little better than last week.  No side effects to prednisone   She also increased pantoprazole  to BID, as directed (to take BID x 2 weeks, then go back to once daily).  She was prescribed trazodone  to help with sleep. She admits that she was afraid to try it, has been using melatonin instead (not really working, but also cough/illness is contributing to sleep issues now).  Today--main complaints are cough and congestion. Nasal drainage is clear (has never been discolored). She has been taking Mucinex  DM 12 hour since 1/11. This has been helping with the cough. Albuterol  nebulizer is also helpful.   PMH, PSH, SH reviewed  Outpatient Encounter Medications as of 01/11/2024  Medication Sig Note    albuterol  (PROVENTIL ) (2.5 MG/3ML) 0.083% nebulizer solution Take 3 mLs (2.5 mg total) by nebulization every 6 (six) hours as needed for wheezing or shortness of breath. 01/11/2024: Used today   amoxicillin -clavulanate (AUGMENTIN ) 875-125 MG tablet Take 1 tablet by mouth 2 (two) times daily.    azelastine  (ASTELIN ) 0.1 % nasal spray Place 2 sprays into each nostril 2 (two) times daily as directed.    dicyclomine  (BENTYL ) 10 MG capsule Take 1 capsule (10 mg total) by mouth 4 (four) times daily - before meals and at bedtime.    ferrous sulfate  325 (65 FE) MG EC tablet Take 325 mg by mouth daily with breakfast. 01/11/2024: gummy   Fremanezumab -vfrm (AJOVY ) 225 MG/1.5ML SOAJ Inject 225 mg into the skin every 30 (thirty) days.    pantoprazole  (PROTONIX ) 40 MG tablet Take 1 tablet (40 mg total) by mouth 2 (two) times daily. Drop down to once a day when symptoms controlled.    predniSONE  (DELTASONE ) 20 MG tablet Take 2 tablets (40 mg total) by mouth daily with breakfast for 5 days.    Vitamin D , Ergocalciferol , (DRISDOL ) 1.25 MG (50000 UNIT) CAPS capsule Take 1 capsule (50,000 Units total) by mouth every 7 (seven) days.    albuterol  (VENTOLIN  HFA) 108 (90 Base) MCG/ACT inhaler Inhale 1-2 puffs into the lungs every 6 (six) hours as needed for wheezing or shortness of breath. (Patient not taking: Reported on 01/11/2024) 01/11/2024: As needed   budesonide  (PULMICORT ) 0.25 MG/2ML  nebulizer solution Take 2 mLs (0.25 mg total) by nebulization daily. For bronchitis/asthma exacerbation. (Patient not taking: Reported on 01/11/2024)    Fluticasone -Umeclidin-Vilant (TRELEGY ELLIPTA ) 200-62.5-25 MCG/ACT AEPB Use 1 Inhalation daily. (Patient not taking: Reported on 01/11/2024) 01/11/2024: As needed   HYDROcodone  bit-homatropine (HYCODAN) 5-1.5 MG/5ML syrup Take 5 mLs by mouth at bedtime as needed for cough. (Patient not taking: Reported on 01/11/2024) 01/11/2024: Never got   ibuprofen  (ADVIL ) 800 MG tablet Take 1 tablet (800 mg  total) by mouth every 8 (eight) hours as needed. (Patient not taking: Reported on 01/11/2024) 01/11/2024: As needed   ketoconazole  (NIZORAL ) 2 % shampoo Apply 1 Application topically 2 (two) times a week. (Patient not taking: Reported on 01/11/2024) 01/11/2024: As needed   levocetirizine (XYZAL ) 5 MG tablet Take 1 tablet (5 mg total) by mouth every evening. (Patient not taking: Reported on 01/11/2024) 01/11/2024: As needed   mometasone  (ELOCON ) 0.1 % cream Apply a thin layer to the affected area twice a day. (Patient not taking: Reported on 01/11/2024) 01/11/2024: As needed   triamcinolone  (NASACORT ) 55 MCG/ACT AERO nasal inhaler Place 1 spray into the nose 2 (two) times daily. (Patient not taking: Reported on 01/11/2024)    valACYclovir  (VALTREX ) 1000 MG tablet TAKE 2 TABLETS (2,000 MG TOTAL) BY MOUTH 2 (TWO) TIMES DAILY. (Patient not taking: Reported on 01/11/2024) 01/11/2024: As needed   [DISCONTINUED] Ferric Maltol  (ACCRUFER ) 30 MG CAPS Take 1 tablet by mouth daily.    [DISCONTINUED] nystatin -triamcinolone  (MYCOLOG II) cream Apply 1 Application topically 2 (two) times daily.    [DISCONTINUED] traZODone  (DESYREL ) 50 MG tablet Take 1/2-2 tablets (25-100 mg total) by mouth at bedtime as needed for sleep. (Patient not taking: Reported on 01/05/2024)    No facility-administered encounter medications on file as of 01/11/2024.   Allergies  Allergen Reactions   Sumatriptan Other (See Comments)    Nausea  Nausea  Nausea    ROS: Hasn't had any fever, chills, body aches. Smell has been affected some, but still has smell; taste is much less. URI symptoms per HPI.  No n/v/d. Breathing is stable--BID nebulizer and Mucinex  DM is helping. Has 1 day left of prednisone .    PHYSICAL EXAM:  BP 130/88   Pulse 89   Temp 98.9 F (37.2 C) (Tympanic)   Ht 5' 8 (1.727 m)   Wt 282 lb 12.8 oz (128.3 kg)   LMP 12/14/2023 (Approximate)   SpO2 95%   BMI 43.00 kg/m   Well-appearing, pleasant female, in no  distress. Some sniffling and wet-sounding cough during the visit HEENT: conjunctiva and sclera are clear, EOMI. TM's and EAC's are normal. Nasal mucosa is moderately edematous with clear mucus noted bilaterally. Sinuses are nontender. OP is clear. Neck: no lymphadenopathy or mass Heart: regular rate and rhythm Lungs: clear bilaterally--no wheezes, rales or ronchi, good air movement. Psych: normal mood, affect, hygiene and grooming Neuro: alert and oriented, cranial nerves grossly intact.    ASSESSMENT/PLAN:  Bacterial URI - currently on ABX, Mucinex  DM, prednisone , albuternol nebs BID, and improving. Reviewed other supportive measures  Moderate persistent asthma without complication - complete prednisone . To restart Trelegy, and use albuterol  nebs prn (may need less with Trelegy on board). pulm consult pending   I spent 33 minutes dedicated to the care of this patient, including pre-visit review of records, face to face time, post-visit ordering of testing and documentation.  Stay well hydrated. Continue the mucinex  DM 12 hour twice daily. Continue the antibiotic and prednisone , as directed. You CAN  use your Trelegy along with the albuterol  nebulizer(you may find that you don't need to use the nebulizer as often when using the Trelegy). I recommend using a decongestant such as pseudoephedrine to help with your nasal congestion (which is contributing to cough).  You need to get this from behind the counter at the pharmacy. Don't get a 24 hour version since you are already struggling with sleep. Consider doing sinus rinses once or twice daily (NeilMed sinus rinse kit, or a neti-pot).  Be sure to use distilled or boiled water, not tap water.

## 2024-01-11 NOTE — Patient Instructions (Signed)
 Stay well hydrated. Continue the mucinex  DM 12 hour twice daily. Continue the antibiotic and prednisone , as directed. You CAN use your Trelegy along with the albuterol  nebulizer(you may find that you don't need to use the nebulizer as often when using the Trelegy). I recommend using a decongestant such as pseudoephedrine to help with your nasal congestion (which is contributing to cough).  You need to get this from behind the counter at the pharmacy. Don't get a 24 hour version since you are already struggling with sleep. Consider doing sinus rinses once or twice daily (NeilMed sinus rinse kit, or a neti-pot).  Be sure to use distilled or boiled water, not tap water.

## 2024-01-13 NOTE — Assessment & Plan Note (Signed)
 Persistent reflux and indigestion despite maximum pantoprazole  dose. Symptoms may contribute to respiratory issues. Discussed increasing pantoprazole  dosage and potential long-term side effects. - Increase pantoprazole  to 40 mg twice daily for 2 weeks - Monitor for symptom improvement

## 2024-01-13 NOTE — Assessment & Plan Note (Signed)
 Increased nebulizer use, nocturnal symptoms of chest tightness and pain. Oxygen saturation at 94%. Possible triggers include environmental factors and allergens. Differential includes asthma exacerbation, GERD, and infection. Discussed Trelegy inhaler benefits and side effects. Emphasized adherence. Need for pulmonary function tests to identify underlying cause. - Order pulmonary function tests - Prescribe Trelegy inhaler - Continue albuterol  as needed - Start Augmentin  for potential bacterial infection - Increase pantoprazole  to 40 mg twice daily for 2 weeks - Referral to pulmonology for further evaluation

## 2024-01-13 NOTE — Assessment & Plan Note (Signed)
 Difficulty falling and staying asleep, with anxiety and stress as contributing factors. Hesitant to take trazodone  due to side effects. Discussed trazodone  and melatonin as alternatives. - Recommend trazodone  50 mg, starting with half a dose 30 minutes before bedtime - Consider melatonin if trazodone  is not tolerated

## 2024-02-02 ENCOUNTER — Other Ambulatory Visit (HOSPITAL_COMMUNITY): Payer: Self-pay

## 2024-02-04 ENCOUNTER — Ambulatory Visit: Payer: 59

## 2024-02-04 ENCOUNTER — Encounter: Payer: Self-pay | Admitting: Pulmonary Disease

## 2024-02-04 ENCOUNTER — Other Ambulatory Visit (HOSPITAL_COMMUNITY): Payer: Self-pay

## 2024-02-04 ENCOUNTER — Ambulatory Visit: Payer: 59 | Admitting: Pulmonary Disease

## 2024-02-04 ENCOUNTER — Encounter: Payer: Self-pay | Admitting: Hematology and Oncology

## 2024-02-04 VITALS — BP 112/64 | HR 92 | Ht 68.0 in | Wt 288.0 lb

## 2024-02-04 DIAGNOSIS — J4540 Moderate persistent asthma, uncomplicated: Secondary | ICD-10-CM

## 2024-02-04 DIAGNOSIS — K219 Gastro-esophageal reflux disease without esophagitis: Secondary | ICD-10-CM

## 2024-02-04 DIAGNOSIS — R0600 Dyspnea, unspecified: Secondary | ICD-10-CM | POA: Diagnosis not present

## 2024-02-04 DIAGNOSIS — R0683 Snoring: Secondary | ICD-10-CM | POA: Diagnosis not present

## 2024-02-04 DIAGNOSIS — J45909 Unspecified asthma, uncomplicated: Secondary | ICD-10-CM | POA: Diagnosis not present

## 2024-02-04 MED ORDER — FAMOTIDINE 20 MG PO TABS
20.0000 mg | ORAL_TABLET | Freq: Every day | ORAL | 5 refills | Status: DC
Start: 2024-02-04 — End: 2024-02-05

## 2024-02-04 MED ORDER — FLUTICASONE FUROATE-VILANTEROL 200-25 MCG/ACT IN AEPB
1.0000 | INHALATION_SPRAY | Freq: Every day | RESPIRATORY_TRACT | 5 refills | Status: DC
  Filled 2024-02-04: qty 60, 30d supply, fill #0

## 2024-02-04 NOTE — Progress Notes (Signed)
 Synopsis: Referred in February 2025 for asthma  Subjective:   PATIENT ID: Audrey [name] GENDER: female DOB: February 10, 1986, MRN: [id number]   HPI  Chief Complaint  Patient presents with   Consult    Pt states end of 2024 had a bad cough while laying down, SOB, wheezing, clear sputum.     Audrey [name] is a 38 year old woman, never smoker with history of DMII and obesity who is referred to pulmonary clinic for asthma.   She reports development of cough, shortness of breath and wheezing since last summer which has progressed throughout the fall and winter. She is currently using trelegy ellipta  200 1 puff daily and as needed albuterol  inhaler or nebs and as needed budesonide  nebs. She is using astelin  nasal spray and previously using nasocort but stopped as her insurance does not cover it. She is taking Xyzal  daily. She is also taking pantoprazole  40mg  daily for daily GERD symptoms.   She reports the coughing is worse with laying down. She denies significant nasal congestion or drainage at this time.  She has exertional dyspnea. She also reports night time snoring and waking up gasping at times.   She is a never smoker. No second hand smoke. She has two daughters. 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   Past Medical History:  Diagnosis Date   Arthritis    Atypical chest pain 10/03/2020   Common migraine with intractable migraine 01/23/2017   Dandruff 03/24/2023   Diabetes mellitus without complication (HCC)    Encounter for annual physical exam 09/18/2023   Fetal demise 05/19/2013   GERD (gastroesophageal reflux disease)    with pregnancy   Gestational diabetes    Headache    migraines   Hemorrhage after delivery of fetus 08/12/2006   Morbidly obese (HCC) 01/23/2017   Motion sickness 08/27/2023   Posterior rhinorrhea 02/13/2023   Preeclampsia    Pregnancy induced hypertension    S/P cesarean section 05/20/2013   S/P cesarean section 11/18/2014   Subacute cough 03/24/2023      Family History  Adopted: Yes  Problem Relation Age of Onset   Diabetes Mother    Asthma Other    Hyperlipidemia Other    Obesity Other    Diabetes Maternal Grandmother    Hypertension Maternal Grandmother    Diabetes Paternal Grandmother    Hypertension Paternal Grandmother      Social History   Socioeconomic History   Marital status: Single    Spouse name: Not on file   Number of children: 1   Years of education: 12+   Highest education level: Not on file  Occupational History    Employer: [employer]  Tobacco Use   Smoking status: Never   Smokeless tobacco: Never  Vaping Use   Vaping status: Never Used  Substance and Sexual Activity   Alcohol use: No   Drug use: No   Sexual activity: Not Currently  Other Topics Concern   Not on file  Social History Narrative   Patient lives at home with daughter.    Patient has has 1 child and one on the way.    Patient has some college education.    Patient works at [employer].          Social Drivers of Health   Financial Resource Strain: Low Risk  (09/18/2023)   Overall Financial Resource Strain (CARDIA)    Difficulty of Paying Living Expenses: Not very hard  Food Insecurity: No Food Insecurity (09/18/2023)  Hunger Vital Sign    Worried About Running Out of Food in the Last Year: Never true    Ran Out of Food in the Last Year: Never true  Transportation Needs: No Transportation Needs (09/18/2023)   PRAPARE - Administrator, Civil Service (Medical): No    Lack of Transportation (Non-Medical): No  Physical Activity: Insufficiently Active (09/18/2023)   Exercise Vital Sign    Days of Exercise per Week: 2 days    Minutes of Exercise per Session: 30 min  Stress: Stress Concern Present (09/18/2023)   [employer] of Occupational Health - Occupational Stress Questionnaire    Feeling of Stress : To some extent  Social Connections: Moderately Isolated (09/18/2023)   Social Connection and Isolation Panel [NHANES]     Frequency of Communication with Friends and Family: More than three times a week    Frequency of Social Gatherings with Friends and Family: Twice a week    Attends Religious Services: More than 4 times per year    Active Member of [employer] or Organizations: No    Attends Banker Meetings: Never    Marital Status: Never married  Intimate Partner Violence: Not At Risk (09/18/2023)   Humiliation, Afraid, Rape, and Kick questionnaire    Fear of Current or Ex-Partner: No    Emotionally Abused: No    Physically Abused: No    Sexually Abused: No     Allergies  Allergen Reactions   Sumatriptan Other (See Comments)    Nausea  Nausea  Nausea     Outpatient Medications Prior to Visit  Medication Sig Dispense Refill   albuterol  (PROVENTIL ) (2.5 MG/3ML) 0.083% nebulizer solution Take 3 mLs (2.5 mg total) by nebulization every 6 (six) hours as needed for wheezing or shortness of breath. 75 mL 3   albuterol  (VENTOLIN  HFA) 108 (90 Base) MCG/ACT inhaler Inhale 1-2 puffs into the lungs every 6 (six) hours as needed for wheezing or shortness of breath. 6.7 g 1   azelastine  (ASTELIN ) 0.1 % nasal spray Place 2 sprays into each nostril 2 (two) times daily as directed. 30 mL 12   budesonide  (PULMICORT ) 0.25 MG/2ML nebulizer solution Take 2 mLs (0.25 mg total) by nebulization daily. For bronchitis/asthma exacerbation. 30 mL 12   dicyclomine  (BENTYL ) 10 MG capsule Take 1 capsule (10 mg total) by mouth 4 (four) times daily - before meals and at bedtime. 90 capsule 2   ferrous sulfate  325 (65 FE) MG EC tablet Take 325 mg by mouth daily with breakfast.     Fremanezumab -vfrm (AJOVY ) 225 MG/1.5ML SOAJ Inject 225 mg into the skin every 30 (thirty) days. 1.5 mL 3   HYDROcodone  bit-homatropine (HYCODAN) 5-1.5 MG/5ML syrup Take 5 mLs by mouth at bedtime as needed for cough. 180 mL 0   ibuprofen  (ADVIL ) 800 MG tablet Take 1 tablet (800 mg total) by mouth every 8 (eight) hours as needed. 30 tablet 0    ketoconazole  (NIZORAL ) 2 % shampoo Apply 1 Application topically 2 (two) times a week. 120 mL 5   levocetirizine (XYZAL ) 5 MG tablet Take 1 tablet (5 mg total) by mouth every evening. 90 tablet 3   mometasone  (ELOCON ) 0.1 % cream Apply a thin layer to the affected area twice a day. 45 g 1   pantoprazole  (PROTONIX ) 40 MG tablet Take 1 tablet (40 mg total) by mouth 2 (two) times daily. Drop down to once a day when symptoms controlled. (Patient taking differently: Take 40 mg by mouth daily.)  60 tablet 3   valACYclovir  (VALTREX ) 1000 MG tablet      Vitamin D , Ergocalciferol , (DRISDOL ) 1.25 MG (50000 UNIT) CAPS capsule Take 1 capsule (50,000 Units total) by mouth every 7 (seven) days. 12 capsule 3   amoxicillin -clavulanate (AUGMENTIN ) 875-125 MG tablet Take 1 tablet by mouth 2 (two) times daily. 20 tablet 3   Fluticasone -Umeclidin-Vilant (TRELEGY ELLIPTA ) 200-62.5-25 MCG/ACT AEPB Use 1 Inhalation daily. 60 each 6   triamcinolone  (NASACORT ) 55 MCG/ACT AERO nasal inhaler Place 1 spray into the nose 2 (two) times daily. (Patient not taking: Reported on 01/11/2024) 1 Inhaler 2   No facility-administered medications prior to visit.    Review of Systems  Constitutional:  Negative for chills, fever, malaise/fatigue and weight loss.  HENT:  Negative for congestion, sinus pain and sore throat.   Eyes: Negative.   Respiratory:  Positive for cough, shortness of breath and wheezing. Negative for hemoptysis and sputum production.   Cardiovascular:  Negative for chest pain, palpitations, orthopnea, claudication and leg swelling.  Gastrointestinal:  Positive for heartburn. Negative for abdominal pain, nausea and vomiting.  Genitourinary: Negative.   Musculoskeletal:  Negative for joint pain and myalgias.  Skin:  Negative for rash.  Neurological:  Negative for weakness.  Endo/Heme/Allergies: Negative.   Psychiatric/Behavioral: Negative.      Objective:   Vitals:   02/04/24 0942  BP: 112/64  Pulse: 92   SpO2: 94%  Weight: 288 lb (130.6 kg)  Height: 5' 8 (1.727 m)     Physical Exam Constitutional:      General: She is not in acute distress.    Appearance: Normal appearance. She is obese.  Eyes:     General: No scleral icterus.    Conjunctiva/sclera: Conjunctivae normal.  Cardiovascular:     Rate and Rhythm: Normal rate and regular rhythm.  Pulmonary:     Breath sounds: No wheezing, rhonchi or rales.  Musculoskeletal:     Right lower leg: No edema.     Left lower leg: No edema.  Skin:    General: Skin is warm and dry.  Neurological:     General: No focal deficit present.     CBC    Component Value Date/Time   WBC 12.6 (H) 12/15/2023 1521   WBC 9.0 07/08/2023 0959   WBC 11.3 (H) 09/25/2021 1514   RBC 4.12 12/15/2023 1521   RBC 4.14 07/08/2023 0959   RBC 4.24 07/08/2023 0959   HGB 11.2 12/15/2023 1521   HCT 36.6 12/15/2023 1521   PLT 341 12/15/2023 1521   MCV 89 12/15/2023 1521   MCH 27.2 12/15/2023 1521   MCH 28.8 07/08/2023 0959   MCHC 30.6 (L) 12/15/2023 1521   MCHC 31.2 07/08/2023 0959   RDW 13.6 12/15/2023 1521   LYMPHSABS 3.1 12/15/2023 1521   MONOABS 0.7 07/08/2023 0959   EOSABS 0.3 12/15/2023 1521   BASOSABS 0.1 12/15/2023 1521      Latest Ref Rng & Units 09/18/2023    9:11 AM 07/08/2023    9:59 AM 01/13/2023    9:55 AM  BMP  Glucose 70 - 99 mg/dL 87  85  85   BUN 6 - 20 mg/dL 10  12  10    Creatinine 0.57 - 1.00 mg/dL 9.33  9.23  9.22   BUN/Creat Ratio 9 - 23 15     Sodium 134 - 144 mmol/L 145  141  142   Potassium 3.5 - 5.2 mmol/L 4.6  4.3  4.0   Chloride 96 - 106  mmol/L 107  108  108   CO2 20 - 29 mmol/L 22  26  27    Calcium  8.7 - 10.2 mg/dL 8.8  9.2  9.0     Chest imaging:  PFT:     No data to display          Labs:  Path:  Echo:  Heart Catheterization:       Assessment & Plan:   Moderate persistent asthma without complication - Plan: fluticasone  furoate-vilanterol (BREO ELLIPTA ) 200-25 MCG/ACT AEPB, DG Chest 2  View  Gastroesophageal reflux disease without esophagitis - Plan: famotidine  (PEPCID ) 20 MG tablet  Snoring - Plan: Home sleep test  Discussion: Audrey [name] is a 38 year old woman, never smoker with history of DMII and obesity who is referred to pulmonary clinic for asthma.   Moderate Persistent Asthma Her asthma appears to be aggravated by reflux and seasonal allergies - continue trelegy ellipta  200 until gone - then start breo ellipta  200 1 puff daily, prescription sent to pharmacy - continue as needed albuterol  or  budesonide  nebs - check chest x-ray today - check PFTs  GERD - continue pantoprazole  40mg  daily - start famotidine  20mg  at bed time - sleep with head elevated via wedge pillow or elevated head board - eat last meal or snack 2-3 hours prior to bed time  Snoring - check home sleep study due to symptoms of snoring and not feeling rested  Seasonal Allergies - Continue xyzal  daily - continue astelin  nasal spray  Follow up in 3 months  Dorn Chill, MD [facility] Office: [phone]    Current Outpatient Medications:    albuterol  (PROVENTIL ) (2.5 MG/3ML) 0.083% nebulizer solution, Take 3 mLs (2.5 mg total) by nebulization every 6 (six) hours as needed for wheezing or shortness of breath., Disp: 75 mL, Rfl: 3   albuterol  (VENTOLIN  HFA) 108 (90 Base) MCG/ACT inhaler, Inhale 1-2 puffs into the lungs every 6 (six) hours as needed for wheezing or shortness of breath., Disp: 6.7 g, Rfl: 1   azelastine  (ASTELIN ) 0.1 % nasal spray, Place 2 sprays into each nostril 2 (two) times daily as directed., Disp: 30 mL, Rfl: 12   budesonide  (PULMICORT ) 0.25 MG/2ML nebulizer solution, Take 2 mLs (0.25 mg total) by nebulization daily. For bronchitis/asthma exacerbation., Disp: 30 mL, Rfl: 12   dicyclomine  (BENTYL ) 10 MG capsule, Take 1 capsule (10 mg total) by mouth 4 (four) times daily - before meals and at bedtime., Disp: 90 capsule, Rfl: 2   famotidine   (PEPCID ) 20 MG tablet, Take 1 tablet (20 mg total) by mouth at bedtime., Disp: 30 tablet, Rfl: 5   ferrous sulfate  325 (65 FE) MG EC tablet, Take 325 mg by mouth daily with breakfast., Disp: , Rfl:    fluticasone  furoate-vilanterol (BREO ELLIPTA ) 200-25 MCG/ACT AEPB, Inhale 1 puff into the lungs daily., Disp: 60 each, Rfl: 5   Fremanezumab -vfrm (AJOVY ) 225 MG/1.5ML SOAJ, Inject 225 mg into the skin every 30 (thirty) days., Disp: 1.5 mL, Rfl: 3   HYDROcodone  bit-homatropine (HYCODAN) 5-1.5 MG/5ML syrup, Take 5 mLs by mouth at bedtime as needed for cough., Disp: 180 mL, Rfl: 0   ibuprofen  (ADVIL ) 800 MG tablet, Take 1 tablet (800 mg total) by mouth every 8 (eight) hours as needed., Disp: 30 tablet, Rfl: 0   ketoconazole  (NIZORAL ) 2 % shampoo, Apply 1 Application topically 2 (two) times a week., Disp: 120 mL, Rfl: 5   levocetirizine (XYZAL ) 5 MG tablet, Take 1 tablet (5 mg  total) by mouth every evening., Disp: 90 tablet, Rfl: 3   mometasone  (ELOCON ) 0.1 % cream, Apply a thin layer to the affected area twice a day., Disp: 45 g, Rfl: 1   pantoprazole  (PROTONIX ) 40 MG tablet, Take 1 tablet (40 mg total) by mouth 2 (two) times daily. Drop down to once a day when symptoms controlled. (Patient taking differently: Take 40 mg by mouth daily.), Disp: 60 tablet, Rfl: 3   valACYclovir  (VALTREX ) 1000 MG tablet, , Disp: , Rfl:    Vitamin D , Ergocalciferol , (DRISDOL ) 1.25 MG (50000 UNIT) CAPS capsule, Take 1 capsule (50,000 Units total) by mouth every 7 (seven) days., Disp: 12 capsule, Rfl: 3

## 2024-02-04 NOTE — Patient Instructions (Addendum)
 Continue trelegy ellipta  1 puff daily until gone - rinse mouth out after each use  Use albuterol  inhaler or nebulizer treatment as needed  Use budesonide  nebs twice daily as needed  Will send in prescription for Breo ellipta  1 puff daily after you - rinse mouth out after each use  Continue pantoprazole  40mg  daily, take 30 minutes before breakfast  Start famotidine  20mg  at bedtime  Sleep with head of bed elevated 4-6 inches via wedge pillow or with bed riser blocks  Cut out the ginger ale in the evening time  Continue astelin  nasal spray  Continue levocetirizine daily for allergies  We will schedule you for a home sleep study  Check chest x-ray today  Follow up in 3 months with pulmonary function tests

## 2024-02-05 ENCOUNTER — Other Ambulatory Visit (HOSPITAL_COMMUNITY): Payer: Self-pay

## 2024-02-05 ENCOUNTER — Encounter: Payer: Self-pay | Admitting: Hematology and Oncology

## 2024-02-05 MED ORDER — FAMOTIDINE 20 MG PO TABS
20.0000 mg | ORAL_TABLET | Freq: Every day | ORAL | 5 refills | Status: DC
  Filled 2024-02-05: qty 30, 30d supply, fill #0

## 2024-02-05 NOTE — Telephone Encounter (Signed)
Routing to Dr. Erin Fulling.

## 2024-02-05 NOTE — Telephone Encounter (Signed)
 Mychart message sent by pt stating that Audrey [name] was going to cost her around $75. Pt is requesting to have something different sent in.   Routing to prior auth team to see which inhalers are preferred with pt's insurance and then will send to Dr. Kara once we have this information.

## 2024-02-05 NOTE — Telephone Encounter (Signed)
 Test claims show the following results for alternatives in the same class.  [place] - non formulary Advair HFA - (Brand) $59.13 (Generic) non-formulary Advair Diskus - (Brand) $144.62 (Generic) $5.00 Wixela Inhub - $5.00 Breyna  - $5.00

## 2024-02-10 ENCOUNTER — Encounter: Payer: Self-pay | Admitting: Nurse Practitioner

## 2024-02-10 DIAGNOSIS — E559 Vitamin D deficiency, unspecified: Secondary | ICD-10-CM

## 2024-02-10 DIAGNOSIS — D500 Iron deficiency anemia secondary to blood loss (chronic): Secondary | ICD-10-CM

## 2024-02-10 DIAGNOSIS — D6489 Other specified anemias: Secondary | ICD-10-CM

## 2024-02-10 DIAGNOSIS — R5383 Other fatigue: Secondary | ICD-10-CM

## 2024-02-12 ENCOUNTER — Other Ambulatory Visit: Payer: 59

## 2024-02-12 MED ORDER — BUDESONIDE-FORMOTEROL FUMARATE 160-4.5 MCG/ACT IN AERO
2.0000 | INHALATION_SPRAY | Freq: Two times a day (BID) | RESPIRATORY_TRACT | 12 refills | Status: AC
  Filled 2024-02-12: qty 10.2, 30d supply, fill #0
  Filled 2025-01-13: qty 10.2, 30d supply, fill #1

## 2024-02-13 ENCOUNTER — Other Ambulatory Visit: Payer: Self-pay

## 2024-02-13 ENCOUNTER — Other Ambulatory Visit (HOSPITAL_BASED_OUTPATIENT_CLINIC_OR_DEPARTMENT_OTHER): Payer: Self-pay

## 2024-02-19 ENCOUNTER — Encounter (HOSPITAL_BASED_OUTPATIENT_CLINIC_OR_DEPARTMENT_OTHER): Payer: Self-pay | Admitting: Obstetrics & Gynecology

## 2024-02-19 ENCOUNTER — Ambulatory Visit (HOSPITAL_BASED_OUTPATIENT_CLINIC_OR_DEPARTMENT_OTHER): Payer: 59 | Admitting: Obstetrics & Gynecology

## 2024-02-19 ENCOUNTER — Other Ambulatory Visit (HOSPITAL_COMMUNITY): Payer: Self-pay

## 2024-02-19 ENCOUNTER — Other Ambulatory Visit (HOSPITAL_COMMUNITY)
Admission: RE | Admit: 2024-02-19 | Discharge: 2024-02-19 | Disposition: A | Discharge status: Home / Self Care | Payer: 59 | Source: Ambulatory Visit | Attending: Obstetrics & Gynecology | Admitting: Obstetrics & Gynecology

## 2024-02-19 VITALS — BP 149/90 | HR 72 | Ht 68.0 in | Wt 286.2 lb

## 2024-02-19 DIAGNOSIS — E611 Iron deficiency: Secondary | ICD-10-CM

## 2024-02-19 DIAGNOSIS — N921 Excessive and frequent menstruation with irregular cycle: Secondary | ICD-10-CM | POA: Diagnosis not present

## 2024-02-19 DIAGNOSIS — Z124 Encounter for screening for malignant neoplasm of cervix: Secondary | ICD-10-CM

## 2024-02-19 DIAGNOSIS — N83201 Unspecified ovarian cyst, right side: Secondary | ICD-10-CM | POA: Diagnosis not present

## 2024-02-19 DIAGNOSIS — N9089 Other specified noninflammatory disorders of vulva and perineum: Secondary | ICD-10-CM | POA: Diagnosis not present

## 2024-02-19 DIAGNOSIS — Z8632 Personal history of gestational diabetes: Secondary | ICD-10-CM

## 2024-02-19 DIAGNOSIS — K625 Hemorrhage of anus and rectum: Secondary | ICD-10-CM | POA: Diagnosis not present

## 2024-02-19 DIAGNOSIS — Z8639 Personal history of other endocrine, nutritional and metabolic disease: Secondary | ICD-10-CM

## 2024-02-19 DIAGNOSIS — R5383 Other fatigue: Secondary | ICD-10-CM | POA: Diagnosis not present

## 2024-02-19 MED ORDER — SLYND 4 MG PO TABS
1.0000 | ORAL_TABLET | Freq: Every day | ORAL | 3 refills | Status: DC
  Filled 2024-02-19: qty 28, 28d supply, fill #0

## 2024-02-19 NOTE — Progress Notes (Signed)
GYNECOLOGY  VISIT  CC:   Ovarian cyst, bleeding, h/o anemia, needs pap Referral from PCP:  Enid Skeens, NP  HPI: 38 y.o. [phone] Single Black or African American female here for several gyn related issue.s  She has h/o menorrhagia resulting in iron deficiency anemia.  Cycles are usually regular and last 7 days.  She has heavy flow for the first part of the cycle requiring both pads and tampons.  She does pass clots at times.  Has been followed by Dr. Leonides Schanz in hematology/oncology and received five iron infusion earlier in 2024.  Most recent lab work was done 12/15/2023 with hb of 11.2 and iron of 25 with ferritin of 31.  Prior to iron infusions, ferritin was 7.  She has started to feel some fatigue and reached out to her PCP, Enid Skeens, who ordered lab work.  Due to the snow this week, she has been unable to get it done.  Advised can do today especially considering prior bleeding and anemia hx.  She is on oral iron.  Reports she skipped a cycle recently which is unusual for her.    Has never been treated for her bleeding.  Treatment options including progesterone methods, progesterone IUD, endometrial ablation discussed.  She would prefer not to be on oral hormonal therapy and knows I would prefer no estrogen therapy given headache hx.  She would like to stay away from estrogens as well.  Will start with Slynd.  She is interested in endometrial ablation.  Will be able to discuss more after ultrasound.  Will also need to look at c section scar with ultrasound given prior hx of 3 c sections.    CT was ordered in October for increased insulin levels and this showed incidental finding of 6.5cm cystic lesion on the left adnexa.  Follow up imaging recommended 3-6 months.  Discussed ordering ultrasound for follow up.  She is fine with this.  Do not feel need repeat CT imaging for follow up.  She reports hx of vulvar irritation.  No SA and hasn't been in some time.  Would like to figure out the cause.  Has been  given mometasone for treatment.    Also reports small amount of rectal bleeding with every bowel movement.  Is red.  Wonders about hemorrhoids.  Has seen GI in the past, Hyacinth Meeker, due to rectal itching.  Was seen in 2023.    Has not had hx of abnormal pap smears but it is due so will update today with HR HPV testing.     Past Medical History:  Diagnosis Date   Arthritis    Atypical chest pain 10/03/2020   Common migraine with intractable migraine 01/23/2017   Dandruff 03/24/2023   Diabetes mellitus without complication (HCC)    Encounter for annual physical exam 09/18/2023   Fetal demise 05/19/2013   GERD (gastroesophageal reflux disease)    with pregnancy   Gestational diabetes    Headache    migraines   Hemorrhage after delivery of fetus 08/12/2006   Morbidly obese (HCC) 01/23/2017   Motion sickness 08/27/2023   Posterior rhinorrhea 02/13/2023   Preeclampsia    Pregnancy induced hypertension    S/P cesarean section 05/20/2013   S/P cesarean section 11/18/2014   Subacute cough 03/24/2023    MEDS:   Current Outpatient Medications on File Prior to Visit  Medication Sig Dispense Refill   albuterol (PROVENTIL) (2.5 MG/3ML) 0.083% nebulizer solution Take 3 mLs (2.5 mg total) by nebulization every 6 (  six) hours as needed for wheezing or shortness of breath. 75 mL 3   albuterol (VENTOLIN HFA) 108 (90 Base) MCG/ACT inhaler Inhale 1-2 puffs into the lungs every 6 (six) hours as needed for wheezing or shortness of breath. 6.7 g 1   azelastine (ASTELIN) 0.1 % nasal spray Place 2 sprays into each nostril 2 (two) times daily as directed. 30 mL 12   budesonide (PULMICORT) 0.25 MG/2ML nebulizer solution Take 2 mLs (0.25 mg total) by nebulization daily. For bronchitis/asthma exacerbation. 30 mL 12   budesonide-formoterol (SYMBICORT) 160-4.5 MCG/ACT inhaler Inhale 2 puffs into the lungs 2 (two) times daily. 10.2 g 12   dicyclomine (BENTYL) 10 MG capsule Take 1 capsule (10 mg total) by  mouth 4 (four) times daily - before meals and at bedtime. 90 capsule 2   famotidine (PEPCID) 20 MG tablet Take 1 tablet (20 mg total) by mouth at bedtime. 30 tablet 5   ferrous sulfate 325 (65 FE) MG EC tablet Take 325 mg by mouth daily with breakfast.     Fremanezumab-vfrm (AJOVY) 225 MG/1.5ML SOAJ Inject 225 mg into the skin every 30 (thirty) days. 1.5 mL 3   HYDROcodone bit-homatropine (HYCODAN) 5-1.5 MG/5ML syrup Take 5 mLs by mouth at bedtime as needed for cough. 180 mL 0   ibuprofen (ADVIL) 800 MG tablet Take 1 tablet (800 mg total) by mouth every 8 (eight) hours as needed. 30 tablet 0   ketoconazole (NIZORAL) 2 % shampoo Apply 1 Application topically 2 (two) times a week. 120 mL 5   levocetirizine (XYZAL) 5 MG tablet Take 1 tablet (5 mg total) by mouth every evening. 90 tablet 3   mometasone (ELOCON) 0.1 % cream Apply a thin layer to the affected area twice a day. 45 g 1   pantoprazole (PROTONIX) 40 MG tablet Take 1 tablet (40 mg total) by mouth 2 (two) times daily. Drop down to once a day when symptoms controlled. (Patient taking differently: Take 40 mg by mouth daily.) 60 tablet 3   valACYclovir (VALTREX) 1000 MG tablet      Vitamin D, Ergocalciferol, (DRISDOL) 1.25 MG (50000 UNIT) CAPS capsule Take 1 capsule (50,000 Units total) by mouth every 7 (seven) days. 12 capsule 3   No current facility-administered medications on file prior to visit.    ALLERGIES: Sumatriptan  SH:  single, non smoker  Review of Systems  Constitutional: Negative.   Genitourinary:        Menorrhagia    PHYSICAL EXAMINATION:    BP (!) 149/90 (BP Location: Right Arm, Patient Position: Sitting, Cuff Size: Large)   Pulse 72   Ht 5\' 8"  (1.727 m) Comment: Reported  Wt 286 lb 3.2 oz (129.8 kg)   BMI 43.52 kg/m     General appearance: alert, cooperative and appears stated age Abdomen: soft, non-tender;  c sections scars noted Lymph:  no inguinal LAD noted  Pelvic: External genitalia:  no lesions               Urethra:  normal appearing urethra with no masses, tenderness or lesions              Bartholins and Skenes: normal                 Vagina: normal mucosa without prolapse or lesions              Cervix: no lesions              Bimanual Exam:  Uterus:  normal size, contour, position, consistency, mobility, non-tender, does not feel like there is anterior scarring              Adnexa: no mass, fullness, tenderness              Rectovaginal: Yes.  .  Confirms.              Anus:  normal sphincter tone, no lesions, no hemorrhoids noted today, no findings d/w fissure either, non tender  Chaperone, Ina Homes, CMA, was present for exam.  Assessment/Plan: 1. Cyst of right ovary (Primary) - she will be called to schedule ultrasound - US PELVIS TRANSVAGINAL NON-OB (TV ONLY); Future  2. Other fatigue - VITAMIN D 25 Hydroxy (Vit-D Deficiency, Fractures) - Vitamin B12 - has normal TSH 08/2023  3. Iron deficiency - Iron, TIBC and Ferritin Panel - CBC with Differential/Platelet  5. Cervical cancer screening - Cytology - PAP( [place])  6. History of gestational diabetes - Hemoglobin A1c  7. Menorrhagia with irregular cycle - treatment options discussed.  Will start Slynd and evaluation for possible endometrial ablation.  - Drospirenone (SLYND) 4 MG TABS; Take 1 tablet (4 mg total) by mouth daily.  Dispense: 28 tablet; Refill: 3  8. Rectal bleeding - no hemorrhoids or fissure noted on exam today. - Ambulatory referral to Gastroenterology  9. Vulvar irritation - Cervicovaginal ancillary only( [place]) - Urine Culture

## 2024-02-20 LAB — CBC WITH DIFFERENTIAL/PLATELET
Basophils Absolute: 0.1 10*3/uL (ref 0.0–0.2)
Basos: 1 %
EOS (ABSOLUTE): 0.2 10*3/uL (ref 0.0–0.4)
Eos: 2 %
Hematocrit: 41.6 % (ref 34.0–46.6)
Hemoglobin: 12.9 g/dL (ref 11.1–15.9)
Immature Grans (Abs): 0.1 10*3/uL (ref 0.0–0.1)
Immature Granulocytes: 1 %
Lymphocytes Absolute: 3.2 10*3/uL — ABNORMAL HIGH (ref 0.7–3.1)
Lymphs: 27 %
MCH: 27.1 pg (ref 26.6–33.0)
MCHC: 31 g/dL — ABNORMAL LOW (ref 31.5–35.7)
MCV: 87 fL (ref 79–97)
Monocytes Absolute: 0.9 10*3/uL (ref 0.1–0.9)
Monocytes: 8 %
Neutrophils Absolute: 7.5 10*3/uL — ABNORMAL HIGH (ref 1.4–7.0)
Neutrophils: 61 %
Platelets: 426 10*3/uL (ref 150–450)
RBC: 4.76 x10E6/uL (ref 3.77–5.28)
RDW: 14.1 % (ref 11.7–15.4)
WBC: 11.9 10*3/uL — ABNORMAL HIGH (ref 3.4–10.8)

## 2024-02-20 LAB — VITAMIN B12: Vitamin B-12: 636 pg/mL (ref 232–1245)

## 2024-02-20 LAB — VITAMIN D 25 HYDROXY (VIT D DEFICIENCY, FRACTURES): Vit D, 25-Hydroxy: 16 ng/mL — ABNORMAL LOW (ref 30.0–100.0)

## 2024-02-20 LAB — IRON,TIBC AND FERRITIN PANEL
Ferritin: 39 ng/mL (ref 15–150)
Iron Saturation: 14 % — ABNORMAL LOW (ref 15–55)
Iron: 51 ug/dL (ref 27–159)
Total Iron Binding Capacity: 373 ug/dL (ref 250–450)
UIBC: 322 ug/dL (ref 131–425)

## 2024-02-20 LAB — URINE CULTURE

## 2024-02-20 LAB — HEMOGLOBIN A1C
Est. average glucose Bld gHb Est-mCnc: 105 mg/dL
Hgb A1c MFr Bld: 5.3 % (ref 4.8–5.6)

## 2024-02-22 ENCOUNTER — Other Ambulatory Visit (HOSPITAL_BASED_OUTPATIENT_CLINIC_OR_DEPARTMENT_OTHER): Payer: Self-pay | Admitting: Obstetrics & Gynecology

## 2024-02-22 ENCOUNTER — Encounter (HOSPITAL_BASED_OUTPATIENT_CLINIC_OR_DEPARTMENT_OTHER): Payer: Self-pay | Admitting: Obstetrics & Gynecology

## 2024-02-22 ENCOUNTER — Other Ambulatory Visit (HOSPITAL_COMMUNITY): Payer: Self-pay

## 2024-02-22 ENCOUNTER — Other Ambulatory Visit: Payer: Self-pay | Admitting: Nurse Practitioner

## 2024-02-22 DIAGNOSIS — E559 Vitamin D deficiency, unspecified: Secondary | ICD-10-CM

## 2024-02-22 DIAGNOSIS — K625 Hemorrhage of anus and rectum: Secondary | ICD-10-CM

## 2024-02-22 DIAGNOSIS — M545 Low back pain, unspecified: Secondary | ICD-10-CM

## 2024-02-22 DIAGNOSIS — K529 Noninfective gastroenteritis and colitis, unspecified: Secondary | ICD-10-CM

## 2024-02-22 DIAGNOSIS — K909 Intestinal malabsorption, unspecified: Secondary | ICD-10-CM

## 2024-02-22 LAB — CYTOLOGY - PAP
Adequacy: ABSENT
Comment: NEGATIVE
Diagnosis: NEGATIVE
High risk HPV: NEGATIVE

## 2024-02-22 LAB — CERVICOVAGINAL ANCILLARY ONLY
Bacterial Vaginitis (gardnerella): NEGATIVE
Candida Glabrata: NEGATIVE
Candida Vaginitis: NEGATIVE
Comment: NEGATIVE
Comment: NEGATIVE
Comment: NEGATIVE

## 2024-02-22 MED ORDER — CYCLOBENZAPRINE HCL 10 MG PO TABS
10.0000 mg | ORAL_TABLET | Freq: Three times a day (TID) | ORAL | 0 refills | Status: AC | PRN
  Filled 2024-02-22: qty 30, 10d supply, fill #0

## 2024-02-22 MED ORDER — TRAMADOL HCL 50 MG PO TABS
50.0000 mg | ORAL_TABLET | Freq: Three times a day (TID) | ORAL | 0 refills | Status: DC | PRN
  Filled 2024-02-22: qty 20, 7d supply, fill #0

## 2024-02-23 ENCOUNTER — Other Ambulatory Visit (HOSPITAL_BASED_OUTPATIENT_CLINIC_OR_DEPARTMENT_OTHER): Payer: Self-pay | Admitting: *Deleted

## 2024-02-23 DIAGNOSIS — K909 Intestinal malabsorption, unspecified: Secondary | ICD-10-CM

## 2024-02-25 LAB — CELIAC DISEASE PANEL
Endomysial IgA: NEGATIVE
IgA/Immunoglobulin A, Serum: 394 mg/dL — ABNORMAL HIGH (ref 87–352)
Transglutaminase IgA: <2 U/mL (ref 0–3)

## 2024-02-25 LAB — SPECIMEN STATUS REPORT

## 2024-02-26 ENCOUNTER — Encounter: Payer: Self-pay | Admitting: Nurse Practitioner

## 2024-02-27 ENCOUNTER — Encounter: Payer: Self-pay | Admitting: Nurse Practitioner

## 2024-03-02 ENCOUNTER — Other Ambulatory Visit (HOSPITAL_BASED_OUTPATIENT_CLINIC_OR_DEPARTMENT_OTHER): Payer: Self-pay

## 2024-03-02 ENCOUNTER — Ambulatory Visit (HOSPITAL_BASED_OUTPATIENT_CLINIC_OR_DEPARTMENT_OTHER): Payer: 59 | Admitting: Obstetrics & Gynecology

## 2024-03-02 ENCOUNTER — Other Ambulatory Visit (HOSPITAL_BASED_OUTPATIENT_CLINIC_OR_DEPARTMENT_OTHER): Payer: 59

## 2024-03-02 ENCOUNTER — Encounter: Payer: Self-pay | Admitting: Hematology and Oncology

## 2024-03-02 ENCOUNTER — Other Ambulatory Visit (HOSPITAL_COMMUNITY): Payer: Self-pay

## 2024-03-02 VITALS — BP 148/85 | HR 75 | Wt 287.0 lb

## 2024-03-02 DIAGNOSIS — Z98891 History of uterine scar from previous surgery: Secondary | ICD-10-CM

## 2024-03-02 DIAGNOSIS — E611 Iron deficiency: Secondary | ICD-10-CM

## 2024-03-02 DIAGNOSIS — N83201 Unspecified ovarian cyst, right side: Secondary | ICD-10-CM | POA: Diagnosis not present

## 2024-03-02 DIAGNOSIS — N921 Excessive and frequent menstruation with irregular cycle: Secondary | ICD-10-CM | POA: Diagnosis not present

## 2024-03-02 DIAGNOSIS — E66813 Obesity, class 3: Secondary | ICD-10-CM

## 2024-03-02 DIAGNOSIS — Z6841 Body Mass Index (BMI) 40.0 and over, adult: Secondary | ICD-10-CM | POA: Diagnosis not present

## 2024-03-02 DIAGNOSIS — Z8742 Personal history of other diseases of the female genital tract: Secondary | ICD-10-CM | POA: Diagnosis not present

## 2024-03-02 MED ORDER — FERROUS SULFATE 5 MG/20ML PO SOLN
5.0000 mL | Freq: Every day | ORAL | 3 refills | Status: AC
  Filled 2024-03-02: qty 473, fill #0

## 2024-03-02 MED ORDER — FERROUS SULFATE 300 MG/6.8ML PO SOLN
5.0000 mL | Freq: Every day | ORAL | 11 refills | Status: AC
  Filled 2024-03-02 (×2): qty 473, 94d supply, fill #0

## 2024-03-03 ENCOUNTER — Other Ambulatory Visit (HOSPITAL_COMMUNITY): Payer: Self-pay

## 2024-03-07 ENCOUNTER — Encounter (HOSPITAL_BASED_OUTPATIENT_CLINIC_OR_DEPARTMENT_OTHER): Payer: Self-pay | Admitting: Obstetrics & Gynecology

## 2024-03-09 ENCOUNTER — Encounter (HOSPITAL_BASED_OUTPATIENT_CLINIC_OR_DEPARTMENT_OTHER): Payer: Self-pay | Admitting: Obstetrics & Gynecology

## 2024-03-09 NOTE — Progress Notes (Signed)
 GYNECOLOGY  VISIT  CC:   Discuss ultrasound results, h/o 6.5cm left ovarian cyst, menorrhagia, h/o c section x 3  HPI: 38 y.o. [id number] Single Black or African American female here for discussion of ultrasound results.  H/o 6.5cm left ovarian cyst noted on CT done 10/29/2023.  Pt has hx of menorrhagia with irregular cycles and iron deficiency anemia.  Has been considering endometrial ablation for treatment of this.  U/s today also done to check c section scar as she has hx of 3 prior c sections.  Has received IV iron as well.  Having trouble finding and oral iron that is liquid that she can take.  Has found pediatric oral iron but will need such a large amount to get enough.  Will reach out to pharmacy to check if there is an adult oral iron  Ultrasound today showed uterus measuring 8.6 x 5.0 x 4.3cm.  C section scar noted with area of scar right at 5mm thickness.  Right ovary normal.  Left ovary 2.6 x 2.8 x 2.7cm with 2.2 x 2.1cm complex cyst noted.  Prior larger 6.5cm ovarian cyst resolved.  Discussed with pt findings especially thin area of c section.  Feel this does add risk of having complications with bladder injury.  Do not feel comfortable with this.  Other options for treatment discussed including progesterone, IUD, and hysterectomy.  She is leaning towards IUD placement.  She does not want this placed today.  Will need endometrial biopsy obtained at the same time.   Reviewed test results from 2/21 with pt as well including iron levels, pap and celiac testing added on by PCP.  Have sent message to Dr. Leonides Schanz about iron levels as well to ensure no additional treatment is needed at this time.    Past Medical History:  Diagnosis Date   Arthritis    Atypical chest pain 10/03/2020   Common migraine with intractable migraine 01/23/2017   Dandruff 03/24/2023   Diabetes mellitus without complication (HCC)    Fetal demise 05/19/2013   Gestational diabetes    Headache    migraines   Hemorrhage  after delivery of fetus 08/12/2006   Morbidly obese (HCC) 01/23/2017   Motion sickness 08/27/2023   Preeclampsia    Pregnancy induced hypertension    S/P cesarean section 05/20/2013   S/P cesarean section 11/18/2014    MEDS:   Current Outpatient Medications on File Prior to Visit  Medication Sig Dispense Refill   albuterol (PROVENTIL) (2.5 MG/3ML) 0.083% nebulizer solution Take 3 mLs (2.5 mg total) by nebulization every 6 (six) hours as needed for wheezing or shortness of breath. 75 mL 3   albuterol (VENTOLIN HFA) 108 (90 Base) MCG/ACT inhaler Inhale 1-2 puffs into the lungs every 6 (six) hours as needed for wheezing or shortness of breath. 6.7 g 1   azelastine (ASTELIN) 0.1 % nasal spray Place 2 sprays into each nostril 2 (two) times daily as directed. 30 mL 12   budesonide (PULMICORT) 0.25 MG/2ML nebulizer solution Take 2 mLs (0.25 mg total) by nebulization daily. For bronchitis/asthma exacerbation. 30 mL 12   budesonide-formoterol (SYMBICORT) 160-4.5 MCG/ACT inhaler Inhale 2 puffs into the lungs 2 (two) times daily. 10.2 g 12   cyclobenzaprine (FLEXERIL) 10 MG tablet Take 1 tablet (10 mg total) by mouth 3 (three) times daily as needed for muscle spasms. 30 tablet 0   dicyclomine (BENTYL) 10 MG capsule Take 1 capsule (10 mg total) by mouth 4 (four) times daily - before meals and at  bedtime. 90 capsule 2   Drospirenone (SLYND) 4 MG TABS Take 1 tablet (4 mg total) by mouth daily. 28 tablet 3   famotidine (PEPCID) 20 MG tablet Take 1 tablet (20 mg total) by mouth at bedtime. 30 tablet 5   ferrous sulfate 325 (65 FE) MG EC tablet Take 325 mg by mouth daily with breakfast.     Fremanezumab-vfrm (AJOVY) 225 MG/1.5ML SOAJ Inject 225 mg into the skin every 30 (thirty) days. 1.5 mL 3   ibuprofen (ADVIL) 800 MG tablet Take 1 tablet (800 mg total) by mouth every 8 (eight) hours as needed. 30 tablet 0   ketoconazole (NIZORAL) 2 % shampoo Apply 1 Application topically 2 (two) times a week. 120 mL 5    levocetirizine (XYZAL) 5 MG tablet Take 1 tablet (5 mg total) by mouth every evening. 90 tablet 3   mometasone (ELOCON) 0.1 % cream Apply a thin layer to the affected area twice a day. 45 g 1   pantoprazole (PROTONIX) 40 MG tablet Take 1 tablet (40 mg total) by mouth 2 (two) times daily. Drop down to once a day when symptoms controlled. (Patient taking differently: Take 40 mg by mouth daily.) 60 tablet 3   traMADol (ULTRAM) 50 MG tablet Take 1 tablet (50 mg total) by mouth every 8 (eight) hours as needed. 20 tablet 0   valACYclovir (VALTREX) 1000 MG tablet      Vitamin D, Ergocalciferol, (DRISDOL) 1.25 MG (50000 UNIT) CAPS capsule Take 1 capsule (50,000 Units total) by mouth every 7 (seven) days. 12 capsule 3   No current facility-administered medications on file prior to visit.    ALLERGIES: Sumatriptan  SH:  single, non smoker  Review of Systems  Constitutional: Negative.   Genitourinary:        Menorrhagia with irregular bleeding    PHYSICAL EXAMINATION:    BP (!) 148/85 (BP Location: Right Arm, Patient Position: Sitting, Cuff Size: Large)   Pulse 75   Wt 287 lb (130.2 kg)   BMI 43.64 kg/m      Physical Exam Constitutional:      Appearance: Normal appearance.  Skin:    General: Skin is warm.  Neurological:     General: No focal deficit present.     Mental Status: She is alert.     Assessment/Plan: 1. Iron deficiency (Primary) - pharmacy did communicate back with pt and pt aware oral iron in liquid is available and low cost.  Rx sent in for pt to begin taking at at least three times weekly to daily. - Ferrous Sulfate 5 MG/20ML SOLN; Take 5 mLs by mouth daily.  Dispense: 473 mL; Refill: 3  2. Menorrhagia with irregular cycle - will proceed with scheduling endometrial biopsy and IUD placement  3. History of cesarean section - findings of thin c section scar noted above  4. History of ovarian cyst - 6.5cm cyst resolved on u/s today  5. Class 3 severe obesity with  body mass index (BMI) of 40.0 to 44.9 in adult, unspecified obesity type, unspecified whether serious comorbidity present (HCC)   Total time with pt:  24 minutes Documentation time:  5 minutes Total time:  29 minutes

## 2024-04-08 ENCOUNTER — Other Ambulatory Visit (HOSPITAL_BASED_OUTPATIENT_CLINIC_OR_DEPARTMENT_OTHER): Payer: Self-pay | Admitting: Obstetrics & Gynecology

## 2024-04-08 DIAGNOSIS — E611 Iron deficiency: Secondary | ICD-10-CM

## 2024-04-08 DIAGNOSIS — N921 Excessive and frequent menstruation with irregular cycle: Secondary | ICD-10-CM

## 2024-04-08 DIAGNOSIS — Z98891 History of uterine scar from previous surgery: Secondary | ICD-10-CM

## 2024-04-27 ENCOUNTER — Encounter (HOSPITAL_BASED_OUTPATIENT_CLINIC_OR_DEPARTMENT_OTHER): Payer: Self-pay

## 2024-05-03 ENCOUNTER — Encounter: Payer: Self-pay | Admitting: Pulmonary Disease

## 2024-05-03 ENCOUNTER — Ambulatory Visit: Payer: 59 | Admitting: Pulmonary Disease

## 2024-05-03 ENCOUNTER — Other Ambulatory Visit (HOSPITAL_COMMUNITY): Payer: Self-pay

## 2024-05-03 VITALS — BP 117/77 | HR 67 | Ht 68.0 in | Wt 283.0 lb

## 2024-05-03 DIAGNOSIS — R0683 Snoring: Secondary | ICD-10-CM

## 2024-05-03 DIAGNOSIS — K219 Gastro-esophageal reflux disease without esophagitis: Secondary | ICD-10-CM

## 2024-05-03 DIAGNOSIS — J4540 Moderate persistent asthma, uncomplicated: Secondary | ICD-10-CM | POA: Diagnosis not present

## 2024-05-03 MED ORDER — FAMOTIDINE 20 MG PO TABS
20.0000 mg | ORAL_TABLET | Freq: Every day | ORAL | 11 refills | Status: AC
  Filled 2024-05-03 – 2024-05-27 (×3): qty 30, 30d supply, fill #0
  Filled 2024-08-26: qty 30, 30d supply, fill #1
  Filled 2024-09-17 – 2024-09-19 (×2): qty 30, 30d supply, fill #2

## 2024-05-03 NOTE — Progress Notes (Signed)
 Synopsis: Referred in February 2025 for asthma  Subjective:   PATIENT ID: Audrey [name] GENDER: female DOB: February 22, 1986, MRN: [id number]   HPI  Chief Complaint  Patient presents with   Follow-up   [name] is a 38 year old woman, never smoker with history of DMII and obesity who returns to pulmonary clinic for asthma.   OV 02/04/24 She reports development of cough, shortness of breath and wheezing since last summer which has progressed throughout the fall and winter. She is currently using trelegy ellipta  200 1 puff daily and as needed albuterol  inhaler or nebs and as needed budesonide  nebs. She is using astelin  nasal spray and previously using nasocort but stopped as her insurance does not cover it. She is taking Xyzal  daily. She is also taking pantoprazole  40mg  daily for daily GERD symptoms.   She reports the coughing is worse with laying down. She denies significant nasal congestion or drainage at this time.  She has exertional dyspnea. She also reports night time snoring and waking up gasping at times.   She is a never smoker. No second hand smoke. She has two daughters. 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   OV 05/03/24 She has been doing well on Breo ellipta  1 puff daily since last visit. Her insurance covers Symbicort  and will be transitioning to this inhaler once done with Breo.  She continues to have intermittent heartburn. Symptoms improved when taking 20mg  famotidine  at bedtime along with pantoprazole  40mg  daily. She has not been on famotidine  over the past couple of months with return of her symptoms. She is scheduled to have hysterectomy in June. She had a hard time completing home sleep study previously, we discussed trying again at home prior to moving towards in-lab study.  She reports some wheezing today but has otherwise been fine with no issues. She has been busy with her daughters senior year of high school and upcoming graduation activities.    Past Medical  History:  Diagnosis Date   Arthritis    Atypical chest pain 10/03/2020   Common migraine with intractable migraine 01/23/2017   Dandruff 03/24/2023   Diabetes mellitus without complication (HCC)    Fetal demise 05/19/2013   Gestational diabetes    Headache    migraines   Hemorrhage after delivery of fetus 08/12/2006   Morbidly obese (HCC) 01/23/2017   Motion sickness 08/27/2023   Preeclampsia    Pregnancy induced hypertension    S/P cesarean section 05/20/2013   S/P cesarean section 11/18/2014     Family History  Adopted: Yes  Problem Relation Age of Onset   Diabetes Mother    Asthma Other    Hyperlipidemia Other    Obesity Other    Diabetes Maternal Grandmother    Hypertension Maternal Grandmother    Diabetes Paternal Grandmother    Hypertension Paternal Grandmother      Social History   Socioeconomic History   Marital status: Single    Spouse name: Not on file   Number of children: 1   Years of education: 12+   Highest education level: Not on file  Occupational History    Employer: [employer]  Tobacco Use   Smoking status: Never   Smokeless tobacco: Never  Vaping Use   Vaping status: Never Used  Substance and Sexual Activity   Alcohol use: No   Drug use: No   Sexual activity: Not Currently  Other Topics Concern   Not on file  Social History Narrative  Patient lives at home with daughter.    Patient has has 1 child and one on the way.    Patient has some college education.    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          Social Drivers of Corporate investment banker Strain: Low Risk  (09/18/2023)   Overall Financial Resource Strain (CARDIA)    Difficulty of Paying Living Expenses: Not very hard  Food Insecurity: No Food Insecurity (09/18/2023)   Hunger Vital Sign    Worried About Running Out of Food in the Last Year: Never true    Ran Out of Food in the Last Year: Never true  Transportation Needs: No Transportation Needs (09/18/2023)   PRAPARE -  Administrator, Civil Service (Medical): No    Lack of Transportation (Non-Medical): No  Physical Activity: Insufficiently Active (03/21/2024)   Received from [facility]   Exercise Vital Sign    Days of Exercise per Week: 2 days    Minutes of Exercise per Session: 30 min  Stress: Stress Concern Present (09/18/2023)   [employer] of Occupational Health - Occupational Stress Questionnaire    Feeling of Stress : To some extent  Social Connections: Moderately Isolated (09/18/2023)   Social Connection and Isolation Panel [NHANES]    Frequency of Communication with Friends and Family: More than three times a week    Frequency of Social Gatherings with Friends and Family: Twice a week    Attends Religious Services: More than 4 times per year    Active Member of [employer] or Organizations: No    Attends Banker Meetings: Never    Marital Status: Never married  Intimate Partner Violence: Not At Risk (09/18/2023)   Humiliation, Afraid, Rape, and Kick questionnaire    Fear of Current or Ex-Partner: No    Emotionally Abused: No    Physically Abused: No    Sexually Abused: No     Allergies  Allergen Reactions   Sumatriptan Other (See Comments)    Nausea  Nausea  Nausea     Outpatient Medications Prior to Visit  Medication Sig Dispense Refill   albuterol  (PROVENTIL ) (2.5 MG/3ML) 0.083% nebulizer solution Take 3 mLs (2.5 mg total) by nebulization every 6 (six) hours as needed for wheezing or shortness of breath. 75 mL 3   albuterol  (VENTOLIN  HFA) 108 (90 Base) MCG/ACT inhaler Inhale 1-2 puffs into the lungs every 6 (six) hours as needed for wheezing or shortness of breath. 6.7 g 1   azelastine  (ASTELIN ) 0.1 % nasal spray Place 2 sprays into each nostril 2 (two) times daily as directed. 30 mL 12   budesonide  (PULMICORT ) 0.25 MG/2ML nebulizer solution Take 2 mLs (0.25 mg total) by nebulization daily. For bronchitis/asthma exacerbation. 30 mL 12    budesonide -formoterol  (SYMBICORT ) 160-4.5 MCG/ACT inhaler Inhale 2 puffs into the lungs 2 (two) times daily. 10.2 g 12   cyclobenzaprine  (FLEXERIL ) 10 MG tablet Take 1 tablet (10 mg total) by mouth 3 (three) times daily as needed for muscle spasms. 30 tablet 0   dicyclomine  (BENTYL ) 10 MG capsule Take 1 capsule (10 mg total) by mouth 4 (four) times daily - before meals and at bedtime. 90 capsule 2   Drospirenone  (SLYND ) 4 MG TABS Take 1 tablet (4 mg total) by mouth daily. 28 tablet 3   famotidine  (PEPCID ) 20 MG tablet Take 1 tablet (20 mg total) by mouth at bedtime. 30 tablet 5   Ferrous Sulfate  (ONE  VITE FERROUS SULFATE ) 300 MG/6.8ML SOLN Take 5 mLs (220mg  total) by mouth daily. 473 mL 11   ferrous sulfate  325 (65 FE) MG EC tablet Take 325 mg by mouth daily with breakfast.     Ferrous Sulfate  5 MG/20ML SOLN Take 5 mLs by mouth daily. 473 mL 3   Fremanezumab -vfrm (AJOVY ) 225 MG/1.5ML SOAJ Inject 225 mg into the skin every 30 (thirty) days. 1.5 mL 3   ibuprofen  (ADVIL ) 800 MG tablet Take 1 tablet (800 mg total) by mouth every 8 (eight) hours as needed. 30 tablet 0   ketoconazole  (NIZORAL ) 2 % shampoo Apply 1 Application topically 2 (two) times a week. 120 mL 5   levocetirizine (XYZAL ) 5 MG tablet Take 1 tablet (5 mg total) by mouth every evening. 90 tablet 3   mometasone  (ELOCON ) 0.1 % cream Apply a thin layer to the affected area twice a day. 45 g 1   pantoprazole  (PROTONIX ) 40 MG tablet Take 1 tablet (40 mg total) by mouth 2 (two) times daily. Drop down to once a day when symptoms controlled. (Patient taking differently: Take 40 mg by mouth daily.) 60 tablet 3   traMADol  (ULTRAM ) 50 MG tablet Take 1 tablet (50 mg total) by mouth every 8 (eight) hours as needed. 20 tablet 0   valACYclovir  (VALTREX ) 1000 MG tablet      Vitamin D , Ergocalciferol , (DRISDOL ) 1.25 MG (50000 UNIT) CAPS capsule Take 1 capsule (50,000 Units total) by mouth every 7 (seven) days. 12 capsule 3   No facility-administered  medications prior to visit.    Review of Systems  Constitutional:  Negative for chills, fever, malaise/fatigue and weight loss.  HENT:  Negative for congestion, sinus pain and sore throat.   Eyes: Negative.   Respiratory:  Negative for cough, hemoptysis, sputum production, shortness of breath and wheezing.   Cardiovascular:  Negative for chest pain, palpitations, orthopnea, claudication and leg swelling.  Gastrointestinal:  Positive for heartburn. Negative for abdominal pain, nausea and vomiting.  Genitourinary: Negative.   Musculoskeletal:  Negative for joint pain and myalgias.  Skin:  Negative for rash.  Neurological:  Negative for weakness.  Endo/Heme/Allergies: Negative.   Psychiatric/Behavioral: Negative.     Objective:   Vitals:   05/03/24 0841  BP: 117/77  Pulse: 67  SpO2: 98%  Weight: 283 lb (128.4 kg)  Height: 5\' 8"  (1.727 m)   Physical Exam Constitutional:      General: She is not in acute distress.    Appearance: Normal appearance. She is obese.  Eyes:     General: No scleral icterus.    Conjunctiva/sclera: Conjunctivae normal.  Cardiovascular:     Rate and Rhythm: Normal rate and regular rhythm.  Pulmonary:     Breath sounds: No wheezing, rhonchi or rales.  Musculoskeletal:     Right lower leg: No edema.     Left lower leg: No edema.  Skin:    General: Skin is warm and dry.  Neurological:     General: No focal deficit present.    CBC    Component Value Date/Time   WBC 11.9 (H) 02/19/2024 1132   WBC 9.0 07/08/2023 0959   WBC 11.3 (H) 09/25/2021 1514   RBC 4.76 02/19/2024 1132   RBC 4.14 07/08/2023 0959   RBC 4.24 07/08/2023 0959   HGB 12.9 02/19/2024 1132   HCT 41.6 02/19/2024 1132   PLT 426 02/19/2024 1132   MCV 87 02/19/2024 1132   MCH 27.1 02/19/2024 1132   MCH 28.8 07/08/2023  0959   MCHC 31.0 (L) 02/19/2024 1132   MCHC 31.2 07/08/2023 0959   RDW 14.1 02/19/2024 1132   LYMPHSABS 3.2 (H) 02/19/2024 1132   MONOABS 0.7 07/08/2023 0959    EOSABS 0.2 02/19/2024 1132   BASOSABS 0.1 02/19/2024 1132      Latest Ref Rng & Units 09/18/2023    9:11 AM 07/08/2023    9:59 AM 01/13/2023    9:55 AM  BMP  Glucose 70 - 99 mg/dL 87  85  85   BUN 6 - 20 mg/dL 10  12  10    Creatinine 0.57 - 1.00 mg/dL 1.61  0.96  0.45   BUN/Creat Ratio 9 - 23 15     Sodium 134 - 144 mmol/L 145  141  142   Potassium 3.5 - 5.2 mmol/L 4.6  4.3  4.0   Chloride 96 - 106 mmol/L 107  108  108   CO2 20 - 29 mmol/L 22  26  27    Calcium  8.7 - 10.2 mg/dL 8.8  9.2  9.0     Chest imaging: CXR 02/04/24 The cardiomediastinal contours are normal. The lungs are clear. Pulmonary vasculature is normal. No consolidation, pleural effusion, or pneumothorax. No acute osseous abnormalities are seen.  PFT:     No data to display          Labs:  Path:  Echo:  Heart Catheterization:   Assessment & Plan:   Moderate persistent asthma without complication  Gastroesophageal reflux disease without esophagitis  Discussion: Frankie Lumbra is a 38 year old woman, never smoker with history of DMII and obesity who is referred to pulmonary clinic for asthma.   Moderate Persistent Asthma Her asthma appears to be aggravated by reflux and seasonal allergies - continue breo ellipta  200 1 puff daily - transition to symbicort  160-4.85mcg 2 puffs twice daily as this is on her insurance formulary - rinse mouth out after each use - continue as needed albuterol  or budesonide  nebs  Pre-Op respiratory Evaluation - ARISCAT Score for Postoperative Pulmonary Complications: Low risk -1.6% risk of in-hospital post-op pulmonary complications (composite including respiratory failure, respiratory infection, pleural effusion, atelectasis, pneumothorax, bronchospasm, aspiration pneumonitis) - recommend using CPAP/Bipap in post op setting for concern of underlying sleep apnea  GERD - continue pantoprazole  40mg  daily - resume famotidine  20mg  at bed time - sleep with head elevated via  wedge pillow or elevated head board - eat last meal or snack 2-3 hours prior to bed time  Snoring - check home sleep study due to symptoms of snoring and not feeling rested - may require CPAP/Bipap in post-op setting for her hysterectomy  Seasonal Allergies - Continue xyzal  daily - continue astelin  nasal spray  Follow up in 6 months  Duaine German, MD [facility] Office: [phone]    Current Outpatient Medications:    albuterol  (PROVENTIL ) (2.5 MG/3ML) 0.083% nebulizer solution, Take 3 mLs (2.5 mg total) by nebulization every 6 (six) hours as needed for wheezing or shortness of breath., Disp: 75 mL, Rfl: 3   albuterol  (VENTOLIN  HFA) 108 (90 Base) MCG/ACT inhaler, Inhale 1-2 puffs into the lungs every 6 (six) hours as needed for wheezing or shortness of breath., Disp: 6.7 g, Rfl: 1   azelastine  (ASTELIN ) 0.1 % nasal spray, Place 2 sprays into each nostril 2 (two) times daily as directed., Disp: 30 mL, Rfl: 12   budesonide  (PULMICORT ) 0.25 MG/2ML nebulizer solution, Take 2 mLs (0.25 mg total) by nebulization daily. For bronchitis/asthma exacerbation.,  Disp: 30 mL, Rfl: 12   budesonide -formoterol  (SYMBICORT ) 160-4.5 MCG/ACT inhaler, Inhale 2 puffs into the lungs 2 (two) times daily., Disp: 10.2 g, Rfl: 12   cyclobenzaprine  (FLEXERIL ) 10 MG tablet, Take 1 tablet (10 mg total) by mouth 3 (three) times daily as needed for muscle spasms., Disp: 30 tablet, Rfl: 0   dicyclomine  (BENTYL ) 10 MG capsule, Take 1 capsule (10 mg total) by mouth 4 (four) times daily - before meals and at bedtime., Disp: 90 capsule, Rfl: 2   Drospirenone  (SLYND ) 4 MG TABS, Take 1 tablet (4 mg total) by mouth daily., Disp: 28 tablet, Rfl: 3   famotidine  (PEPCID ) 20 MG tablet, Take 1 tablet (20 mg total) by mouth at bedtime., Disp: 30 tablet, Rfl: 5   Ferrous Sulfate  (ONE VITE FERROUS SULFATE ) 300 MG/6.8ML SOLN, Take 5 mLs (220mg  total) by mouth daily., Disp: 473 mL, Rfl: 11   ferrous sulfate   325 (65 FE) MG EC tablet, Take 325 mg by mouth daily with breakfast., Disp: , Rfl:    Ferrous Sulfate  5 MG/20ML SOLN, Take 5 mLs by mouth daily., Disp: 473 mL, Rfl: 3   Fremanezumab -vfrm (AJOVY ) 225 MG/1.5ML SOAJ, Inject 225 mg into the skin every 30 (thirty) days., Disp: 1.5 mL, Rfl: 3   ibuprofen  (ADVIL ) 800 MG tablet, Take 1 tablet (800 mg total) by mouth every 8 (eight) hours as needed., Disp: 30 tablet, Rfl: 0   ketoconazole  (NIZORAL ) 2 % shampoo, Apply 1 Application topically 2 (two) times a week., Disp: 120 mL, Rfl: 5   levocetirizine (XYZAL ) 5 MG tablet, Take 1 tablet (5 mg total) by mouth every evening., Disp: 90 tablet, Rfl: 3   mometasone  (ELOCON ) 0.1 % cream, Apply a thin layer to the affected area twice a day., Disp: 45 g, Rfl: 1   pantoprazole  (PROTONIX ) 40 MG tablet, Take 1 tablet (40 mg total) by mouth 2 (two) times daily. Drop down to once a day when symptoms controlled. (Patient taking differently: Take 40 mg by mouth daily.), Disp: 60 tablet, Rfl: 3   traMADol  (ULTRAM ) 50 MG tablet, Take 1 tablet (50 mg total) by mouth every 8 (eight) hours as needed., Disp: 20 tablet, Rfl: 0   valACYclovir  (VALTREX ) 1000 MG tablet, , Disp: , Rfl:    Vitamin D , Ergocalciferol , (DRISDOL ) 1.25 MG (50000 UNIT) CAPS capsule, Take 1 capsule (50,000 Units total) by mouth every 7 (seven) days., Disp: 12 capsule, Rfl: 3

## 2024-05-03 NOTE — Patient Instructions (Addendum)
 Continue breo ellipta  200, 1 puff daily - rinse mouth out after each use  Then transition to symbicort  150-4.11mcg 2 puffs twice daily - rinse mouth out after each use  Use albuterol  inhaler as needed  For reflux - continue taking pantoprazole  40mg  daily - resume famotidine  20mg  daily at bed time  We will schedule you for home sleep study again  Follow up in 6 months, call sooner if needed

## 2024-05-13 ENCOUNTER — Other Ambulatory Visit (HOSPITAL_COMMUNITY): Payer: Self-pay

## 2024-05-15 ENCOUNTER — Encounter

## 2024-05-15 DIAGNOSIS — R0683 Snoring: Secondary | ICD-10-CM

## 2024-05-25 ENCOUNTER — Other Ambulatory Visit (HOSPITAL_COMMUNITY): Payer: Self-pay

## 2024-05-25 ENCOUNTER — Other Ambulatory Visit (HOSPITAL_COMMUNITY)
Admission: RE | Admit: 2024-05-25 | Discharge: 2024-05-25 | Disposition: A | Discharge status: Home / Self Care | Source: Ambulatory Visit | Attending: Obstetrics & Gynecology | Admitting: Obstetrics & Gynecology

## 2024-05-25 ENCOUNTER — Ambulatory Visit (HOSPITAL_BASED_OUTPATIENT_CLINIC_OR_DEPARTMENT_OTHER): Admitting: Obstetrics & Gynecology

## 2024-05-25 ENCOUNTER — Encounter (HOSPITAL_BASED_OUTPATIENT_CLINIC_OR_DEPARTMENT_OTHER): Payer: Self-pay | Admitting: Obstetrics & Gynecology

## 2024-05-25 VITALS — BP 117/74 | HR 68 | Ht 68.0 in | Wt 284.4 lb

## 2024-05-25 DIAGNOSIS — G43009 Migraine without aura, not intractable, without status migrainosus: Secondary | ICD-10-CM

## 2024-05-25 DIAGNOSIS — D500 Iron deficiency anemia secondary to blood loss (chronic): Secondary | ICD-10-CM

## 2024-05-25 DIAGNOSIS — Z01818 Encounter for other preprocedural examination: Secondary | ICD-10-CM

## 2024-05-25 DIAGNOSIS — Z6841 Body Mass Index (BMI) 40.0 and over, adult: Secondary | ICD-10-CM

## 2024-05-25 DIAGNOSIS — N921 Excessive and frequent menstruation with irregular cycle: Secondary | ICD-10-CM | POA: Insufficient documentation

## 2024-05-25 LAB — HEMOGLOBIN A1C
Est. average glucose Bld gHb Est-mCnc: 94 mg/dL
Hgb A1c MFr Bld: 4.9 % (ref 4.8–5.6)

## 2024-05-25 NOTE — Progress Notes (Signed)
 38 y.o. [id number] Single Black or [place] American female here for discussion of upcoming procedure.  TLH/bilateral salpingectomy, possible oophorectomy, cystoscopy planned due to h/o menorrhagia, iron  deficiency anemia requiring IV iron , minimal improvement with progesterone (Drosperinone) who is desirous of definitive treatment with hysterectomy.  She was interested in endometrial ablation but c section scar is about 5mm and risks for bladder injury exists with both endometrial ablation and more definitive surgery.  She has decided to proceed with hysterectomy.  Pre-op evaluation thus far has included u/s with uterus measuring 8.6 x 5.0 x 4.3cm.  Endometrium was 6.13mm.  Left ovary had a 2.2 complex cyst noted.  Right ovary was normal.  She does need endometrial biopsy today.  CBC will be obtained today.  Hb A1C is normal.   Has been February since prior appointment to history updated.    Procedure discussed with patient.  Hospital stay, recovery and pain management all discussed.  Risks discussed including but not limited to bleeding, 1% risk of receiving a  transfusion, infection, 3-4% risk of bowel/bladder/ureteral/vascular injury discussed as well as possible need for additional surgery if injury does occur discussed.  DVT/PE and rare risk of death discussed.  My actual complications with prior surgeries discussed.  Vaginal cuff dehiscence discussed.  Hernia formation discussed.  Positioning and incision locations discussed.  Patient aware if pathology abnormal she may need additional treatment.  All questions answered.     Ob Hx:   Patient's last menstrual period was 04/21/2024 (approximate).          Sexually active: Yes.   Birth control: bilateral tubal ligation Last pap: 02/19/2024 neg with neg HR HPV Last MMG: not indicated, age 35 Smoking: No   Past Surgical History:  Procedure Laterality Date   CESAREAN SECTION  07/2006   CESAREAN SECTION N/A 05/19/2013   Procedure: CESAREAN SECTION;   Surgeon: Chrystal Crape, MD;  Location: [facility];  Service: Obstetrics;  Laterality: N/A;   CESAREAN SECTION WITH BILATERAL TUBAL LIGATION Bilateral 11/17/2014   Procedure: CESAREAN SECTION WITH BILATERAL TUBAL LIGATION;  Surgeon: Margaretmary Shaver, MD;  Location: [facility];  Service: Obstetrics;  Laterality: Bilateral;   TOENAIL EXCISION     WISDOM TOOTH EXTRACTION      Past Medical History:  Diagnosis Date   Anemia    r/t uterine bleeding   Arthritis    Asthma    Atypical chest pain 10/03/2020   Common migraine with intractable migraine 01/23/2017   Dandruff 03/24/2023   Diabetes mellitus without complication (HCC)    Fetal demise 05/19/2013   GERD (gastroesophageal reflux disease)    Gestational diabetes    Headache    migraines   Hemorrhage after delivery of fetus 08/12/2006   Morbidly obese (HCC) 01/23/2017   Motion sickness 08/27/2023   Preeclampsia    Pregnancy induced hypertension    S/P cesarean section 05/20/2013   S/P cesarean section 11/18/2014   Sleep apnea 2025    Allergies: Sumatriptan  Current Outpatient Medications  Medication Sig Dispense Refill   albuterol  (PROVENTIL ) (2.5 MG/3ML) 0.083% nebulizer solution Take 3 mLs (2.5 mg total) by nebulization every 6 (six) hours as needed for wheezing or shortness of breath. 75 mL 3   albuterol  (VENTOLIN  HFA) 108 (90 Base) MCG/ACT inhaler Inhale 1-2 puffs into the lungs every 6 (six) hours as needed for wheezing or shortness of breath. 6.7 g 1   azelastine  (ASTELIN ) 0.1 % nasal spray Place 2 sprays into each nostril 2 (two) times daily as directed. (  Patient taking differently: Place 2 sprays into both nostrils 2 (two) times daily as needed for allergies.) 30 mL 12   budesonide  (PULMICORT ) 0.25 MG/2ML nebulizer solution Take 2 mLs (0.25 mg total) by nebulization daily. For bronchitis/asthma exacerbation. (Patient taking differently: Take 0.25 mg by nebulization daily as needed (shortness of breath). For bronchitis/asthma  exacerbation.) 30 mL 12   budesonide -formoterol  (SYMBICORT ) 160-4.5 MCG/ACT inhaler Inhale 2 puffs into the lungs 2 (two) times daily. 10.2 g 12   cyclobenzaprine  (FLEXERIL ) 10 MG tablet Take 1 tablet (10 mg total) by mouth 3 (three) times daily as needed for muscle spasms. 30 tablet 0   dicyclomine  (BENTYL ) 10 MG capsule Take 1 capsule (10 mg total) by mouth 4 (four) times daily - before meals and at bedtime. (Patient taking differently: Take 10 mg by mouth 4 (four) times daily as needed for spasms.) 90 capsule 2   Drospirenone  (SLYND ) 4 MG TABS Take 1 tablet (4 mg total) by mouth daily. 28 tablet 3   famotidine  (PEPCID ) 20 MG tablet Take 1 tablet (20 mg total) by mouth at bedtime. 30 tablet 11   Ferrous Sulfate  (ONE VITE FERROUS SULFATE ) 300 MG/6.8ML SOLN Take 5 mLs (220mg  total) by mouth daily. 473 mL 11   Ferrous Sulfate  5 MG/20ML SOLN Take 5 mLs by mouth daily. (Patient not taking: Reported on 05/30/2024) 473 mL 3   Fremanezumab -vfrm (AJOVY ) 225 MG/1.5ML SOAJ Inject 225 mg into the skin every 30 (thirty) days. (Patient not taking: Reported on 05/30/2024) 1.5 mL 3   ketoconazole  (NIZORAL ) 2 % shampoo Apply 1 Application topically 2 (two) times a week. (Patient not taking: Reported on 05/30/2024) 120 mL 5   levocetirizine (XYZAL ) 5 MG tablet Take 1 tablet (5 mg total) by mouth every evening. (Patient taking differently: Take 5 mg by mouth daily as needed for allergies.) 90 tablet 3   mometasone  (ELOCON ) 0.1 % cream Apply a thin layer to the affected area twice a day. (Patient taking differently: Apply 1 Application topically 2 (two) times daily as needed (itching).) 45 g 1   pantoprazole  (PROTONIX ) 40 MG tablet Take 1 tablet (40 mg total) by mouth 2 (two) times daily. Drop down to once a day when symptoms controlled. (Patient taking differently: Take 40 mg by mouth daily.) 60 tablet 3   traMADol  (ULTRAM ) 50 MG tablet Take 1 tablet (50 mg total) by mouth every 8 (eight) hours as needed. 20 tablet 0    valACYclovir  (VALTREX ) 1000 MG tablet Take 1,000 mg by mouth daily as needed (outbreaks).     Vitamin D , Ergocalciferol , (DRISDOL ) 1.25 MG (50000 UNIT) CAPS capsule Take 1 capsule (50,000 Units total) by mouth every 7 (seven) days. (Patient taking differently: Take 50,000 Units by mouth 2 (two) times a week.) 12 capsule 3   aspirin-acetaminophen -caffeine (EXCEDRIN MIGRAINE) 250-250-65 MG tablet Take 1 tablet by mouth every 6 (six) hours as needed for headache.     fluticasone  furoate-vilanterol (BREO ELLIPTA ) 200-25 MCG/ACT AEPB Inhale 1 puff into the lungs daily.     ibuprofen  (ADVIL ) 200 MG tablet Take 400-800 mg by mouth every 6 (six) hours as needed for moderate pain (pain score 4-6).     No current facility-administered medications for this visit.    ROS: Pertinent items noted in HPI and remainder of comprehensive ROS otherwise negative.  Exam:   BP 117/74 (BP Location: Right Arm, Patient Position: Sitting, Cuff Size: Normal)   Pulse 68   Ht 5\' 8"  (1.727 m)   Wt 284  lb 6.4 oz (129 kg)   LMP 04/21/2024 (Approximate)   BMI 43.24 kg/m   General appearance: alert and cooperative Head: Normocephalic, without obvious abnormality, atraumatic Neck: no adenopathy, supple, symmetrical, trachea midline and thyroid  not enlarged, symmetric, no tenderness/mass/nodules Lungs: clear to auscultation bilaterally Heart: regular rate and rhythm, S1, S2 normal, no murmur, click, rub or gallop Abdomen: soft, non-tender; bowel sounds normal; no masses,  no organomegaly Extremities: extremities normal, atraumatic, no cyanosis or edema Skin: Skin color, texture, turgor normal. No rashes or lesions Lymph nodes: Cervical, supraclavicular, and axillary nodes normal. no inguinal nodes palpated Neurologic: Grossly normal  Pelvic: External genitalia:  no lesions              Urethra: normal appearing urethra with no masses, tenderness or lesions              Bartholins and Skenes: Bartholin's, Urethra,  Skene's normal                 Vagina: normal appearing vagina with normal color and discharge, no lesions              Cervix: normal appearance              Pap taken: No.        Bimanual Exam:  Uterus:  uterus is normal size, shape, consistency and nontender                                      Adnexa:    not indicated                                      Rectovaginal: Deferred                                      Anus:  no lesions  Endometrial biopsy recommended.  Discussed with patient.  Verbal and written consent obtained.   Procedure:  Speculum placed.  Cervix visualized and cleansed with betadine prep.  A single toothed tenaculum was applied to the anterior lip of the cervix.  Endometrial pipelle was advanced through the cervix into the endometrial cavity without difficulty.  Pipelle passed to 8cm.  Suction applied and pipelle removed with good tissue sample obtained.  Tenculum removed.  No bleeding noted.  Patient tolerated procedure well.  Chaperone, Myrtie Atkinson, present for examination.   Assessment/Plan: 1. Menorrhagia with irregular cycle (Primary) - CBC with Differential/Platelet - Follicle stimulating hormone - Surgical pathology( [place]/ [facility]) - TLH with bilateral salpingectomy, possible ovarian cyst removal or USO with cystoscopy planned  2. BMI 40.0-44.9, adult (HCC) - Hemoglobin A1c  3. Pre-op exam - Risks/benefits, alternatives reviewed.  Pt declines IUD use or any other hormonal therapy.   - Medications/Vitamins reviewed.    4. Iron  deficiency anemia due to chronic blood loss - has received IV iron  this year  5. Migraine without aura and without status migrainosus, not intractable - stable

## 2024-05-26 LAB — CBC WITH DIFFERENTIAL/PLATELET
Basophils Absolute: 0.1 10*3/uL (ref 0.0–0.2)
Basos: 1 %
EOS (ABSOLUTE): 0.3 10*3/uL (ref 0.0–0.4)
Eos: 2 %
Hematocrit: 39.2 % (ref 34.0–46.6)
Hemoglobin: 12 g/dL (ref 11.1–15.9)
Immature Grans (Abs): 0.1 10*3/uL (ref 0.0–0.1)
Immature Granulocytes: 1 %
Lymphocytes Absolute: 2.9 10*3/uL (ref 0.7–3.1)
Lymphs: 24 %
MCH: 27.3 pg (ref 26.6–33.0)
MCHC: 30.6 g/dL — ABNORMAL LOW (ref 31.5–35.7)
MCV: 89 fL (ref 79–97)
Monocytes Absolute: 1.1 10*3/uL — ABNORMAL HIGH (ref 0.1–0.9)
Monocytes: 9 %
Neutrophils Absolute: 7.9 10*3/uL — ABNORMAL HIGH (ref 1.4–7.0)
Neutrophils: 63 %
Platelets: 375 10*3/uL (ref 150–450)
RBC: 4.39 x10E6/uL (ref 3.77–5.28)
RDW: 13.4 % (ref 11.7–15.4)
WBC: 12.2 10*3/uL — ABNORMAL HIGH (ref 3.4–10.8)

## 2024-05-26 LAB — FOLLICLE STIMULATING HORMONE: FSH: 3.7 m[IU]/mL

## 2024-05-26 LAB — SURGICAL PATHOLOGY

## 2024-05-27 ENCOUNTER — Ambulatory Visit (HOSPITAL_BASED_OUTPATIENT_CLINIC_OR_DEPARTMENT_OTHER): Payer: Self-pay | Admitting: Obstetrics & Gynecology

## 2024-05-27 ENCOUNTER — Other Ambulatory Visit (HOSPITAL_COMMUNITY): Payer: Self-pay

## 2024-05-27 DIAGNOSIS — G4733 Obstructive sleep apnea (adult) (pediatric): Secondary | ICD-10-CM | POA: Diagnosis not present

## 2024-05-30 NOTE — Pre-Procedure Instructions (Signed)
 Surgical Instructions   Your procedure is scheduled on June 07, 2024. Report to [facility] Main Entrance "A" at 8:30 A.M., then check in with the Admitting office. Any questions or running late day of surgery: call [phone]  Questions prior to your surgery date: call [phone], Monday-Friday, 8am-4pm. If you experience any cold or flu symptoms such as cough, fever, chills, shortness of breath, etc. between now and your scheduled surgery, please notify us  at the above number.     Remember:  Do not eat after midnight the night before your surgery   You may drink clear liquids until 7:30 AM the morning of your surgery.   Clear liquids allowed are: Water, Non-Citrus Juices (without pulp), Carbonated Beverages, Clear Tea (no milk, honey, etc.), Black Coffee Only (NO MILK, CREAM OR POWDERED CREAMER of any kind), and Gatorade.    Take these medicines the morning of surgery with A SIP OF WATER: budesonide -formoterol  (SYMBICORT ) inhaler  fluticasone  furoate-vilanterol (BREO ELLIPTA ) inhaler pantoprazole  (PROTONIX )    May take these medicines IF NEEDED: albuterol  (PROVENTIL ) nebulizer solution  albuterol  (VENTOLIN  HFA) inhaler - please bring inhaler with you morning of surgery azelastine  (ASTELIN ) nasal spray  budesonide  (PULMICORT ) nebulizer  cyclobenzaprine  (FLEXERIL )  dicyclomine  (BENTYL )  levocetirizine (XYZAL )  traMADol  (ULTRAM )  valACYclovir  (VALTREX )    One week prior to surgery, STOP taking any Aspirin (unless otherwise instructed by your surgeon) Aleve , Naproxen , Ibuprofen , Motrin , Advil , Goody's, BC's, all herbal medications, fish oil, and non-prescription vitamins. This includes your medication: aspirin-acetaminophen -caffeine (EXCEDRIN MIGRAINE)                      Do NOT Smoke (Tobacco/Vaping) for 24 hours prior to your procedure.  If you use a CPAP at night, you may bring your mask/headgear for your overnight stay.   You will be asked to remove any contacts,  glasses, piercing's, hearing aid's, dentures/partials prior to surgery. Please bring cases for these items if needed.    Patients discharged the day of surgery will not be allowed to drive home, and someone needs to stay with them for 24 hours.  SURGICAL WAITING ROOM VISITATION Patients may have no more than 2 support people in the waiting area - these visitors may rotate.   Pre-op nurse will coordinate an appropriate time for 1 ADULT support person, who may not rotate, to accompany patient in pre-op.  Children under the age of 53 must have an adult with them who is not the patient and must remain in the main waiting area with an adult.  If the patient needs to stay at the hospital during part of their recovery, the visitor guidelines for inpatient rooms apply.  Please refer to the [facility] website for the visitor guidelines for any additional information.   If you received a COVID test during your pre-op visit  it is requested that you wear a mask when out in public, stay away from anyone that may not be feeling well and notify your surgeon if you develop symptoms. If you have been in contact with anyone that has tested positive in the last 10 days please notify you surgeon.      Pre-operative CHG Bathing Instructions   You can play a key role in reducing the risk of infection after surgery. Your skin needs to be as free of germs as possible. You can reduce the number of germs on your skin by washing with CHG (chlorhexidine gluconate) soap before surgery. CHG is an antiseptic soap that  kills germs and continues to kill germs even after washing.   DO NOT use if you have an allergy to chlorhexidine/CHG or antibacterial soaps. If your skin becomes reddened or irritated, stop using the CHG and notify one of our RNs at [phone].              TAKE A SHOWER THE NIGHT BEFORE SURGERY AND THE DAY OF SURGERY    Please keep in mind the following:  DO NOT shave, including legs and underarms,  48 hours prior to surgery.   You may shave your face before/day of surgery.  Place clean sheets on your bed the night before surgery Use a clean washcloth (not used since being washed) for each shower. DO NOT sleep with pet's night before surgery.  CHG Shower Instructions:  Wash your face and private area with normal soap. If you choose to wash your hair, wash first with your normal shampoo.  After you use shampoo/soap, rinse your hair and body thoroughly to remove shampoo/soap residue.  Turn the water OFF and apply half the bottle of CHG soap to a CLEAN washcloth.  Apply CHG soap ONLY FROM YOUR NECK DOWN TO YOUR TOES (washing for 3-5 minutes)  DO NOT use CHG soap on face, private areas, open wounds, or sores.  Pay special attention to the area where your surgery is being performed.  If you are having back surgery, having someone wash your back for you may be helpful. Wait 2 minutes after CHG soap is applied, then you may rinse off the CHG soap.  Pat dry with a clean towel  Put on clean pajamas    Additional instructions for the day of surgery: DO NOT APPLY any lotions, deodorants, cologne, or perfumes.   Do not wear jewelry or makeup Do not wear nail polish, gel polish, artificial nails, or any other type of covering on natural nails (fingers and toes) Do not bring valuables to the hospital. [facility] is not responsible for valuables/personal belongings. Put on clean/comfortable clothes.  Please brush your teeth.  Ask your nurse before applying any prescription medications to the skin.

## 2024-05-31 ENCOUNTER — Other Ambulatory Visit: Payer: Self-pay

## 2024-05-31 ENCOUNTER — Encounter: Payer: Self-pay | Admitting: Pulmonary Disease

## 2024-05-31 ENCOUNTER — Encounter (HOSPITAL_COMMUNITY): Payer: Self-pay

## 2024-05-31 ENCOUNTER — Encounter (HOSPITAL_COMMUNITY)
Admission: RE | Admit: 2024-05-31 | Discharge: 2024-05-31 | Disposition: A | Discharge status: Home / Self Care | Source: Ambulatory Visit | Attending: Nurse Practitioner | Admitting: Nurse Practitioner

## 2024-05-31 VITALS — BP 137/86 | HR 68 | Temp 98.4°F | Resp 20 | Ht 68.0 in | Wt 285.0 lb

## 2024-05-31 DIAGNOSIS — Z794 Long term (current) use of insulin: Secondary | ICD-10-CM | POA: Insufficient documentation

## 2024-05-31 DIAGNOSIS — G4733 Obstructive sleep apnea (adult) (pediatric): Secondary | ICD-10-CM | POA: Insufficient documentation

## 2024-05-31 DIAGNOSIS — J4540 Moderate persistent asthma, uncomplicated: Secondary | ICD-10-CM | POA: Insufficient documentation

## 2024-05-31 DIAGNOSIS — Z79899 Other long term (current) drug therapy: Secondary | ICD-10-CM | POA: Insufficient documentation

## 2024-05-31 DIAGNOSIS — E119 Type 2 diabetes mellitus without complications: Secondary | ICD-10-CM | POA: Diagnosis not present

## 2024-05-31 DIAGNOSIS — Z01818 Encounter for other preprocedural examination: Secondary | ICD-10-CM | POA: Insufficient documentation

## 2024-05-31 DIAGNOSIS — N921 Excessive and frequent menstruation with irregular cycle: Secondary | ICD-10-CM | POA: Diagnosis not present

## 2024-05-31 DIAGNOSIS — K219 Gastro-esophageal reflux disease without esophagitis: Secondary | ICD-10-CM | POA: Diagnosis not present

## 2024-05-31 HISTORY — DX: Unspecified asthma, uncomplicated: J45.909

## 2024-05-31 HISTORY — DX: Anemia, unspecified: D64.9

## 2024-05-31 LAB — CBC
HCT: 36.9 % (ref 36.0–46.0)
Hemoglobin: 11.4 g/dL — ABNORMAL LOW (ref 12.0–15.0)
MCH: 27.6 pg (ref 26.0–34.0)
MCHC: 30.9 g/dL (ref 30.0–36.0)
MCV: 89.3 fL (ref 80.0–100.0)
Platelets: 344 10*3/uL (ref 150–400)
RBC: 4.13 MIL/uL (ref 3.87–5.11)
RDW: 14.2 % (ref 11.5–15.5)
WBC: 11.1 10*3/uL — ABNORMAL HIGH (ref 4.0–10.5)
nRBC: 0 % (ref 0.0–0.2)

## 2024-05-31 LAB — TYPE AND SCREEN
ABO/RH(D): AB POS
Antibody Screen: NEGATIVE

## 2024-05-31 LAB — BASIC METABOLIC PANEL WITH GFR
Anion gap: 11 (ref 5–15)
BUN: 10 mg/dL (ref 6–20)
CO2: 24 mmol/L (ref 22–32)
Calcium: 8.6 mg/dL — ABNORMAL LOW (ref 8.9–10.3)
Chloride: 104 mmol/L (ref 98–111)
Creatinine, Ser: 0.76 mg/dL (ref 0.44–1.00)
GFR, Estimated: >60 mL/min (ref >60)
Glucose, Bld: 83 mg/dL (ref 70–99)
Potassium: 3.3 mmol/L — ABNORMAL LOW (ref 3.5–5.1)
Sodium: 139 mmol/L (ref 135–145)

## 2024-05-31 NOTE — Telephone Encounter (Signed)
 Please advise as pt is having hysterectomy

## 2024-05-31 NOTE — Progress Notes (Addendum)
 PCP - Archibald Beard, NP Cardiologist - Pt saw Dr. Maudine Sos in 2021 for CP. Symptoms felt to be related to GERD. Pulmonologist - Dr. Duaine German - Last office visit 05/03/2024  PPM/ICD - Denies Device Orders - n/a Rep Notified - n/a  Chest x-ray - 02/04/2024 EKG - Denies Stress Test - Denies ECHO - Denies Cardiac Cath - Denies  Sleep Study - Pt recently diagnosed with mild OSA. Pending MD appointment to discuss results/options  No DM (Pt did have gestational DM, but resolved after delivery)  Last dose of GLP1 agonist- n/a GLP1 instructions: n/a  Blood Thinner Instructions: n/a Aspirin Instructions: n/a  ERAS Protcol - Clear liquids until 0730 morning of surgery PRE-SURGERY Ensure or G2- n/a  COVID TEST- n/a   Anesthesia review: Yes. Incomplete cardiac testing (CT Coronary) from 2021. Discussed with Rudy Costain, PA-C. Pt is able to walk up two flights of stairs without any cardiopulmonary issues and denies any symptoms at pre-op appointment.  Patient denies shortness of breath, fever, cough and chest pain at PAT appointment. Pt denies any respiratory illness/infection in the last two months.    All instructions explained to the patient, with a verbal understanding of the material. Patient agrees to go over the instructions while at home for a better understanding. Patient also instructed to self quarantine after being tested for COVID-19. The opportunity to ask questions was provided.

## 2024-06-01 ENCOUNTER — Other Ambulatory Visit (HOSPITAL_BASED_OUTPATIENT_CLINIC_OR_DEPARTMENT_OTHER): Payer: Self-pay | Admitting: Obstetrics & Gynecology

## 2024-06-01 NOTE — Anesthesia Preprocedure Evaluation (Addendum)
 Anesthesia Evaluation  Patient identified by MRN, date of birth, ID band Patient awake    Reviewed: Allergy & Precautions, NPO status , Patient's Chart, lab work & pertinent test results  Airway Mallampati: II  TM Distance: >3 FB Neck ROM: Full    Dental  (+) Partial Upper, Dental Advisory Given   Pulmonary asthma , sleep apnea    Pulmonary exam normal breath sounds clear to auscultation       Cardiovascular hypertension, Normal cardiovascular exam Rhythm:Regular Rate:Normal     Neuro/Psych  Headaches    GI/Hepatic Neg liver ROS,GERD  Controlled and Medicated,,  Endo/Other    Class 3 obesity (BMI 43)  Renal/GU negative Renal ROS  negative genitourinary   Musculoskeletal  (+) Arthritis ,    Abdominal  (+) + obese  Peds  Hematology  (+) Blood dyscrasia, anemia Hb 11.4, plt 344   Anesthesia Other Findings   Reproductive/Obstetrics 3 prior sections                             Anesthesia Physical Anesthesia Plan  ASA: 3  Anesthesia Plan: General   Post-op Pain Management: Tylenol  PO (pre-op)*, Toradol  IV (intra-op)*, Ketamine  IV* and Dilaudid IV   Induction: Intravenous  PONV Risk Score and Plan: 4 or greater and Ondansetron , Dexamethasone , Midazolam , Scopolamine  patch - Pre-op and Treatment may vary due to age or medical condition  Airway Management Planned: Oral ETT  Additional Equipment: None  Intra-op Plan:   Post-operative Plan: Extubation in OR  Informed Consent:   Plan Discussed with:   Anesthesia Plan Comments: (PAT note by Rudy Costain, PA-C: 38 year old female with pertinent history including GERD on H2 blocker and PPI, non-insulin -dependent DM2, moderate persistent asthma, concern for OSA.  Follows with pulmonologist Dr. Diania Fortes for management of moderate persistent asthma and GERD.  Last seen 05/03/2024, asthma noted to be well-controlled on Breo Ellipta  (in the process  of transitioning to Symbicort  due to insurance formulary).  Upcoming surgery was also discussed.  Per note, "- ARISCAT Score for Postoperative Pulmonary Complications: Low risk -1.6% risk of in-hospital post-op pulmonary complications (composite including respiratory failure, respiratory infection, pleural effusion, atelectasis, pneumothorax, bronchospasm, aspiration pneumonitis) - recommend using CPAP/Bipap in post op setting for concern of underlying sleep apnea."  Patient had prior cardiology evaluation with Dr. Theodis Fiscal in 2021 for atypical chest pain.  Per note, "Atypical chest pain: Symptoms seem most consistent with GERD.  They last for several days at a time, are worse when laying down, and are not worse with exertion.  We will get a coronary CT-a to ensure that she does not have any evidence of obstructive coronary disease.  She has follow-up with gastroenterology next week.  I did recommend that she start taking the pantoprazole  every day to see if this helps."  She was advised to follow-up with cardiology on an as-needed basis.  Patient states that she did not end up pursuing CTA due to symptoms improving with PPI.  She reports no current cardiovascular complaints, states she can go up two flights of steps without issue.  Preop labs reviewed, mild hypokalemia potassium 3.3, mild anemia hemoglobin 11.4, otherwise unremarkable.  EKG 05/31/2024: NSR.  Rate 69.  )        Anesthesia Quick Evaluation

## 2024-06-01 NOTE — Progress Notes (Signed)
 Anesthesia Chart Review:  38 year old female with pertinent history including GERD on H2 blocker and PPI, non-insulin -dependent DM2, moderate persistent asthma, concern for OSA.  Follows with pulmonologist Dr. Diania Fortes for management of moderate persistent asthma and GERD.  Last seen 05/03/2024, asthma noted to be well-controlled on Breo Ellipta  (in the process of transitioning to Symbicort  due to insurance formulary).  Upcoming surgery was also discussed.  Per note, "- ARISCAT Score for Postoperative Pulmonary Complications: Low risk -1.6% risk of in-hospital post-op pulmonary complications (composite including respiratory failure, respiratory infection, pleural effusion, atelectasis, pneumothorax, bronchospasm, aspiration pneumonitis) - recommend using CPAP/Bipap in post op setting for concern of underlying sleep apnea."  Patient had prior cardiology evaluation with Dr. Theodis Fiscal in 2021 for atypical chest pain.  Per note, "Atypical chest pain: Symptoms seem most consistent with GERD.  They last for several days at a time, are worse when laying down, and are not worse with exertion.  We will get a coronary CT-a to ensure that she does not have any evidence of obstructive coronary disease.  She has follow-up with gastroenterology next week.  I did recommend that she start taking the pantoprazole  every day to see if this helps."  She was advised to follow-up with cardiology on an as-needed basis.  Patient states that she did not end up pursuing CTA due to symptoms improving with PPI.  She reports no current cardiovascular complaints, states she can go up two flights of steps without issue.  Preop labs reviewed, mild hypokalemia potassium 3.3, mild anemia hemoglobin 11.4, otherwise unremarkable.  EKG 05/31/2024: NSR.  Rate 69.   Shereda, [facility] Short Stay Center/Anesthesiology Phone [phone] 06/01/2024 1:18 PM

## 2024-06-02 ENCOUNTER — Encounter: Payer: Self-pay | Admitting: Nurse Practitioner

## 2024-06-07 ENCOUNTER — Ambulatory Visit (HOSPITAL_BASED_OUTPATIENT_CLINIC_OR_DEPARTMENT_OTHER): Payer: Self-pay | Admitting: Anesthesiology

## 2024-06-07 ENCOUNTER — Encounter (HOSPITAL_COMMUNITY): Payer: Self-pay | Admitting: Obstetrics & Gynecology

## 2024-06-07 ENCOUNTER — Other Ambulatory Visit: Payer: Self-pay

## 2024-06-07 ENCOUNTER — Encounter (HOSPITAL_COMMUNITY)
Admission: RE | Disposition: A | Discharge status: Home / Self Care | Payer: Self-pay | Source: Home / Self Care | Attending: Obstetrics & Gynecology

## 2024-06-07 ENCOUNTER — Ambulatory Visit (HOSPITAL_COMMUNITY): Payer: Self-pay | Admitting: Physician Assistant

## 2024-06-07 ENCOUNTER — Other Ambulatory Visit (HOSPITAL_COMMUNITY): Payer: Self-pay

## 2024-06-07 ENCOUNTER — Ambulatory Visit (HOSPITAL_COMMUNITY)
Admission: RE | Admit: 2024-06-07 | Discharge: 2024-06-07 | Disposition: A | Discharge status: Home / Self Care | Attending: Obstetrics & Gynecology | Admitting: Obstetrics & Gynecology

## 2024-06-07 DIAGNOSIS — K660 Peritoneal adhesions (postprocedural) (postinfection): Secondary | ICD-10-CM | POA: Diagnosis not present

## 2024-06-07 DIAGNOSIS — N736 Female pelvic peritoneal adhesions (postinfective): Secondary | ICD-10-CM | POA: Diagnosis not present

## 2024-06-07 DIAGNOSIS — G4730 Sleep apnea, unspecified: Secondary | ICD-10-CM | POA: Insufficient documentation

## 2024-06-07 DIAGNOSIS — N920 Excessive and frequent menstruation with regular cycle: Secondary | ICD-10-CM | POA: Diagnosis present

## 2024-06-07 DIAGNOSIS — J45909 Unspecified asthma, uncomplicated: Secondary | ICD-10-CM | POA: Diagnosis not present

## 2024-06-07 DIAGNOSIS — N921 Excessive and frequent menstruation with irregular cycle: Secondary | ICD-10-CM | POA: Insufficient documentation

## 2024-06-07 DIAGNOSIS — I10 Essential (primary) hypertension: Secondary | ICD-10-CM | POA: Diagnosis not present

## 2024-06-07 DIAGNOSIS — Z6841 Body Mass Index (BMI) 40.0 and over, adult: Secondary | ICD-10-CM | POA: Insufficient documentation

## 2024-06-07 DIAGNOSIS — G4733 Obstructive sleep apnea (adult) (pediatric): Secondary | ICD-10-CM

## 2024-06-07 DIAGNOSIS — Z7951 Long term (current) use of inhaled steroids: Secondary | ICD-10-CM | POA: Diagnosis not present

## 2024-06-07 DIAGNOSIS — E119 Type 2 diabetes mellitus without complications: Secondary | ICD-10-CM | POA: Insufficient documentation

## 2024-06-07 DIAGNOSIS — K219 Gastro-esophageal reflux disease without esophagitis: Secondary | ICD-10-CM | POA: Diagnosis not present

## 2024-06-07 DIAGNOSIS — Z01818 Encounter for other preprocedural examination: Secondary | ICD-10-CM

## 2024-06-07 HISTORY — PX: LAPAROSCOPIC LYSIS OF ADHESIONS: SHX5905

## 2024-06-07 LAB — POCT PREGNANCY, URINE: Preg Test, Ur: NEGATIVE

## 2024-06-07 SURGERY — LYSIS, ADHESIONS, LAPAROSCOPIC
Anesthesia: General | Site: Abdomen

## 2024-06-07 MED ORDER — ROCURONIUM BROMIDE 10 MG/ML (PF) SYRINGE
PREFILLED_SYRINGE | INTRAVENOUS | Status: DC | PRN
Start: 2024-06-07 — End: 2024-06-07
  Administered 2024-06-07: 20 mg via INTRAVENOUS
  Administered 2024-06-07: 50 mg via INTRAVENOUS
  Administered 2024-06-07 (×2): 20 mg via INTRAVENOUS
  Administered 2024-06-07: 10 mg via INTRAVENOUS

## 2024-06-07 MED ORDER — GABAPENTIN 300 MG PO CAPS
ORAL_CAPSULE | ORAL | Status: AC
  Filled 2024-06-07: qty 1

## 2024-06-07 MED ORDER — LIDOCAINE 2% (20 MG/ML) 5 ML SYRINGE
INTRAMUSCULAR | Status: AC
  Filled 2024-06-07: qty 5

## 2024-06-07 MED ORDER — ROCURONIUM BROMIDE 10 MG/ML (PF) SYRINGE
PREFILLED_SYRINGE | INTRAVENOUS | Status: AC
Start: 2024-06-07 — End: ?
  Filled 2024-06-07: qty 10

## 2024-06-07 MED ORDER — FENTANYL CITRATE (PF) 100 MCG/2ML IJ SOLN
INTRAMUSCULAR | Status: DC | PRN
  Administered 2024-06-07: 100 ug via INTRAVENOUS

## 2024-06-07 MED ORDER — FENTANYL CITRATE (PF) 100 MCG/2ML IJ SOLN
INTRAMUSCULAR | Status: AC
Start: 2024-06-07 — End: ?
  Filled 2024-06-07: qty 4

## 2024-06-07 MED ORDER — SUGAMMADEX SODIUM 200 MG/2ML IV SOLN
INTRAVENOUS | Status: DC | PRN
  Administered 2024-06-07: 200 mg via INTRAVENOUS

## 2024-06-07 MED ORDER — PROPOFOL 10 MG/ML IV BOLUS
INTRAVENOUS | Status: DC | PRN
Start: 2024-06-07 — End: 2024-06-07
  Administered 2024-06-07: 200 mg via INTRAVENOUS

## 2024-06-07 MED ORDER — PHENYLEPHRINE 80 MCG/ML (10ML) SYRINGE FOR IV PUSH (FOR BLOOD PRESSURE SUPPORT)
PREFILLED_SYRINGE | INTRAVENOUS | Status: AC
  Filled 2024-06-07: qty 10

## 2024-06-07 MED ORDER — PROPOFOL 10 MG/ML IV BOLUS
INTRAVENOUS | Status: AC
Start: 2024-06-07 — End: ?
  Filled 2024-06-07: qty 20

## 2024-06-07 MED ORDER — SODIUM CHLORIDE 0.9 % IV SOLN
2.0000 g | INTRAVENOUS | Status: AC
  Administered 2024-06-07: 2 g via INTRAVENOUS
  Filled 2024-06-07: qty 2

## 2024-06-07 MED ORDER — ACETAMINOPHEN 500 MG PO TABS
1000.0000 mg | ORAL_TABLET | ORAL | Status: AC
  Administered 2024-06-07: 1000 mg via ORAL

## 2024-06-07 MED ORDER — LACTATED RINGERS IV SOLN: INTRAVENOUS | Status: DC

## 2024-06-07 MED ORDER — PHENYLEPHRINE HCL-NACL 20-0.9 MG/250ML-% IV SOLN
INTRAVENOUS | Status: DC | PRN
Start: 2024-06-07 — End: 2024-06-07
  Administered 2024-06-07: 25 ug/min via INTRAVENOUS

## 2024-06-07 MED ORDER — ORAL CARE MOUTH RINSE: 15.0000 mL | Freq: Once | OROMUCOSAL | Status: AC

## 2024-06-07 MED ORDER — SODIUM CHLORIDE 0.9 % IR SOLN
Status: DC | PRN
Start: 2024-06-07 — End: 2024-06-07
  Administered 2024-06-07: 1000 mL

## 2024-06-07 MED ORDER — ONDANSETRON HCL 4 MG/2ML IJ SOLN
INTRAMUSCULAR | Status: DC | PRN
  Administered 2024-06-07: 4 mg via INTRAVENOUS

## 2024-06-07 MED ORDER — IBUPROFEN 800 MG PO TABS
800.0000 mg | ORAL_TABLET | Freq: Three times a day (TID) | ORAL | 0 refills | Status: DC | PRN
  Filled 2024-06-07: qty 30, 10d supply, fill #0

## 2024-06-07 MED ORDER — KETOROLAC TROMETHAMINE 30 MG/ML IJ SOLN: 30.0000 mg | Freq: Once | INTRAMUSCULAR | Status: DC | PRN

## 2024-06-07 MED ORDER — ENOXAPARIN SODIUM 40 MG/0.4ML IJ SOSY
PREFILLED_SYRINGE | INTRAMUSCULAR | Status: AC
  Filled 2024-06-07: qty 0.4

## 2024-06-07 MED ORDER — OXYCODONE HCL 5 MG PO TABS: 5.0000 mg | ORAL_TABLET | Freq: Once | ORAL | Status: DC | PRN

## 2024-06-07 MED ORDER — ONDANSETRON HCL 4 MG/2ML IJ SOLN: 4.0000 mg | Freq: Once | INTRAMUSCULAR | Status: DC | PRN

## 2024-06-07 MED ORDER — DEXAMETHASONE SODIUM PHOSPHATE 10 MG/ML IJ SOLN
INTRAMUSCULAR | Status: AC
  Filled 2024-06-07: qty 1

## 2024-06-07 MED ORDER — SILVER NITRATE-POT NITRATE 75-25 % EX MISC
CUTANEOUS | Status: DC | PRN
  Administered 2024-06-07: 3

## 2024-06-07 MED ORDER — ROPIVACAINE HCL 5 MG/ML IJ SOLN
INTRAMUSCULAR | Status: AC
Start: 2024-06-07 — End: ?
  Filled 2024-06-07: qty 30

## 2024-06-07 MED ORDER — POVIDONE-IODINE 10 % EX SWAB
2.0000 | Freq: Once | CUTANEOUS | Status: AC
  Administered 2024-06-07: 2 via TOPICAL

## 2024-06-07 MED ORDER — EPHEDRINE SULFATE-NACL 50-0.9 MG/10ML-% IV SOSY
PREFILLED_SYRINGE | INTRAVENOUS | Status: DC | PRN
Start: 2024-06-07 — End: 2024-06-07
  Administered 2024-06-07: 5 mg via INTRAVENOUS

## 2024-06-07 MED ORDER — HYDROCODONE-ACETAMINOPHEN 5-325 MG PO TABS
1.0000 | ORAL_TABLET | Freq: Four times a day (QID) | ORAL | 0 refills | Status: DC | PRN
  Filled 2024-06-07: qty 20, 3d supply, fill #0

## 2024-06-07 MED ORDER — ROCURONIUM BROMIDE 10 MG/ML (PF) SYRINGE
PREFILLED_SYRINGE | INTRAVENOUS | Status: AC
  Filled 2024-06-07: qty 10

## 2024-06-07 MED ORDER — CHLORHEXIDINE GLUCONATE 0.12 % MT SOLN
OROMUCOSAL | Status: AC
  Filled 2024-06-07: qty 15

## 2024-06-07 MED ORDER — MIDAZOLAM HCL 2 MG/2ML IJ SOLN
INTRAMUSCULAR | Status: DC | PRN
  Administered 2024-06-07: 2 mg via INTRAVENOUS

## 2024-06-07 MED ORDER — ENOXAPARIN SODIUM 40 MG/0.4ML IJ SOSY
40.0000 mg | PREFILLED_SYRINGE | INTRAMUSCULAR | Status: AC
  Administered 2024-06-07: 40 mg via SUBCUTANEOUS

## 2024-06-07 MED ORDER — HYDROMORPHONE HCL 1 MG/ML IJ SOLN
INTRAMUSCULAR | Status: AC
  Filled 2024-06-07: qty 0.5

## 2024-06-07 MED ORDER — MIDAZOLAM HCL 2 MG/2ML IJ SOLN
INTRAMUSCULAR | Status: AC
  Filled 2024-06-07: qty 2

## 2024-06-07 MED ORDER — LIDOCAINE 2% (20 MG/ML) 5 ML SYRINGE
INTRAMUSCULAR | Status: DC | PRN
Start: 2024-06-07 — End: 2024-06-07
  Administered 2024-06-07: 100 mg via INTRAVENOUS
  Administered 2024-06-07: 50 mg via INTRAVENOUS

## 2024-06-07 MED ORDER — BUPIVACAINE HCL (PF) 0.25 % IJ SOLN
INTRAMUSCULAR | Status: AC
  Filled 2024-06-07: qty 30

## 2024-06-07 MED ORDER — SILVER NITRATE-POT NITRATE 75-25 % EX MISC
CUTANEOUS | Status: AC
  Filled 2024-06-07: qty 10

## 2024-06-07 MED ORDER — CHLORHEXIDINE GLUCONATE 0.12 % MT SOLN
15.0000 mL | Freq: Once | OROMUCOSAL | Status: AC
Start: 2024-06-07 — End: 2024-06-07
  Administered 2024-06-07: 15 mL via OROMUCOSAL

## 2024-06-07 MED ORDER — MEPERIDINE HCL 25 MG/ML IJ SOLN: 6.2500 mg | INTRAMUSCULAR | Status: DC | PRN

## 2024-06-07 MED ORDER — ATROPINE SULFATE 0.4 MG/ML IV SOLN
INTRAVENOUS | Status: DC | PRN
Start: 2024-06-07 — End: 2024-06-07
  Administered 2024-06-07 (×2): .4 mg via INTRAVENOUS

## 2024-06-07 MED ORDER — GABAPENTIN 300 MG PO CAPS
300.0000 mg | ORAL_CAPSULE | ORAL | Status: AC
  Administered 2024-06-07: 300 mg via ORAL

## 2024-06-07 MED ORDER — OXYCODONE HCL 5 MG/5ML PO SOLN: 5.0000 mg | Freq: Once | ORAL | Status: DC | PRN

## 2024-06-07 MED ORDER — SCOPOLAMINE 1 MG/3DAYS TD PT72
1.0000 | MEDICATED_PATCH | TRANSDERMAL | Status: DC
  Administered 2024-06-07: 1.5 mg via TRANSDERMAL

## 2024-06-07 MED ORDER — PHENYLEPHRINE 80 MCG/ML (10ML) SYRINGE FOR IV PUSH (FOR BLOOD PRESSURE SUPPORT)
PREFILLED_SYRINGE | INTRAVENOUS | Status: DC | PRN
  Administered 2024-06-07 (×2): 80 ug via INTRAVENOUS

## 2024-06-07 MED ORDER — ONDANSETRON HCL 4 MG/2ML IJ SOLN
INTRAMUSCULAR | Status: AC
  Filled 2024-06-07: qty 2

## 2024-06-07 MED ORDER — HYDROMORPHONE HCL 1 MG/ML IJ SOLN: 0.2500 mg | INTRAMUSCULAR | Status: DC | PRN

## 2024-06-07 MED ORDER — BUPIVACAINE HCL (PF) 0.25 % IJ SOLN
INTRAMUSCULAR | Status: DC | PRN
  Administered 2024-06-07: 10 mL

## 2024-06-07 MED ORDER — SCOPOLAMINE 1 MG/3DAYS TD PT72
MEDICATED_PATCH | TRANSDERMAL | Status: AC
  Filled 2024-06-07: qty 1

## 2024-06-07 MED ORDER — ACETAMINOPHEN 500 MG PO TABS
ORAL_TABLET | ORAL | Status: AC
  Filled 2024-06-07: qty 2

## 2024-06-07 MED ORDER — SODIUM CHLORIDE (PF) 0.9 % IJ SOLN
INTRAMUSCULAR | Status: AC
Start: 2024-06-07 — End: ?
  Filled 2024-06-07: qty 50

## 2024-06-07 MED ORDER — AMISULPRIDE (ANTIEMETIC) 5 MG/2ML IV SOLN: 10.0000 mg | Freq: Once | INTRAVENOUS | Status: DC | PRN

## 2024-06-07 MED ORDER — SODIUM CHLORIDE (PF) 0.9 % IJ SOLN
INTRAMUSCULAR | Status: AC
  Filled 2024-06-07: qty 10

## 2024-06-07 SURGICAL SUPPLY — 50 items
APPLICATOR ARISTA FLEXITIP XL (MISCELLANEOUS) IMPLANT
APPLICATOR COTTON TIP 6 STRL (MISCELLANEOUS) IMPLANT
APPLICATOR COTTON TIP 6IN STRL (MISCELLANEOUS) ×3 IMPLANT
COVER MAYO STAND STRL (DRAPES) ×3 IMPLANT
COVER SURGICAL LIGHT HANDLE (MISCELLANEOUS) ×3 IMPLANT
DERMABOND ADVANCED .7 DNX12 (GAUZE/BANDAGES/DRESSINGS) ×3 IMPLANT
DRAPE SURG IRRIG POUCH 19X23 (DRAPES) ×3 IMPLANT
DURAPREP 26ML APPLICATOR (WOUND CARE) ×5 IMPLANT
GLOVE BIO SURGEON STRL SZ 6.5 (GLOVE) ×3 IMPLANT
GLOVE BIOGEL PI IND STRL 6.5 (GLOVE) ×3 IMPLANT
GLOVE ECLIPSE 6.5 STRL STRAW (GLOVE) ×6 IMPLANT
GLOVE SURG UNDER POLY LF SZ7 (GLOVE) ×12 IMPLANT
GOWN STRL REUS W/ TWL LRG LVL3 (GOWN DISPOSABLE) ×12 IMPLANT
GOWN STRL REUS W/ TWL XL LVL3 (GOWN DISPOSABLE) ×3 IMPLANT
HEMOSTAT ARISTA ABSORB 3G PWDR (HEMOSTASIS) IMPLANT
HIBICLENS CHG 4% 4OZ BTL (MISCELLANEOUS) ×3 IMPLANT
IRRIGATION SUCT STRKRFLW 2 WTP (MISCELLANEOUS) ×3 IMPLANT
KIT PINK PAD W/HEAD ARM REST (MISCELLANEOUS) ×3 IMPLANT
KIT TURNOVER KIT B (KITS) ×3 IMPLANT
LIGASURE VESSEL 5MM BLUNT TIP (ELECTROSURGICAL) ×3 IMPLANT
NDL INSUFFLATION 14GA 120MM (NEEDLE) ×1 IMPLANT
NEEDLE INSUFFLATION 14GA 120MM (NEEDLE) ×3 IMPLANT
NS IRRIG 1000ML POUR BTL (IV SOLUTION) ×3 IMPLANT
OCCLUDER COLPOPNEUMO (BALLOONS) ×3 IMPLANT
PACK LAPAROSCOPY BASIN (CUSTOM PROCEDURE TRAY) ×3 IMPLANT
POUCH LAPAROSCOPIC INSTRUMENT (MISCELLANEOUS) ×3 IMPLANT
SCALPEL HRMNC RUM II 3.0 SILVR (DISPOSABLE) ×2 IMPLANT
SCISSORS LAP 5X35 DISP (ENDOMECHANICALS) ×3 IMPLANT
SET CYSTO W/LG BORE CLAMP LF (SET/KITS/TRAYS/PACK) ×3 IMPLANT
SET TRI-LUMEN FLTR TB AIRSEAL (TUBING) ×3 IMPLANT
SET TUBE SMOKE EVAC HIGH FLOW (TUBING) ×1 IMPLANT
SHEARS HARMONIC ACE PLUS 36CM (ENDOMECHANICALS) ×3 IMPLANT
SLEEVE SCD COMPRESS KNEE MED (STOCKING) ×3 IMPLANT
SUT VIC AB 0 CT1 27XBRD ANBCTR (SUTURE) ×6 IMPLANT
SUT VIC AB 4-0 PS2 18 (SUTURE) ×6 IMPLANT
SUT VLOC 180 0 9IN GS21 (SUTURE) ×3 IMPLANT
SYR 10ML LL (SYRINGE) ×3 IMPLANT
SYR 50ML LL SCALE MARK (SYRINGE) ×8 IMPLANT
TIP UTERINE 5.1X6CM LAV DISP (MISCELLANEOUS) IMPLANT
TIP UTERINE 6.7X10CM GRN DISP (MISCELLANEOUS) IMPLANT
TIP UTERINE 6.7X6CM WHT DISP (MISCELLANEOUS) ×2 IMPLANT
TIP UTERINE 6.7X8CM BLUE DISP (MISCELLANEOUS) ×2 IMPLANT
TOWEL GREEN STERILE FF (TOWEL DISPOSABLE) ×6 IMPLANT
TRAY FOLEY W/BAG SLVR 14FR (SET/KITS/TRAYS/PACK) ×3 IMPLANT
TROCAR ADV FIXATION 5X100MM (TROCAR) ×3 IMPLANT
TROCAR PORT AIRSEAL 5X120 (TROCAR) ×3 IMPLANT
TROCAR XCEL NON BLADE 8MM B8LT (ENDOMECHANICALS) ×3 IMPLANT
TROCAR XCEL NON-BLD 5MMX100MML (ENDOMECHANICALS) ×1 IMPLANT
UNDERPAD 30X36 HEAVY ABSORB (UNDERPADS AND DIAPERS) ×3 IMPLANT
WARMER LAPAROSCOPE (MISCELLANEOUS) ×3 IMPLANT

## 2024-06-07 NOTE — Discharge Instructions (Signed)

## 2024-06-07 NOTE — Transfer of Care (Signed)
 Immediate Anesthesia Transfer of Care Note  Patient: Audrey [name]  Procedure(s) Performed: LYSIS, ADHESIONS, LAPAROSCOPIC (Abdomen)  Patient Location: PACU  Anesthesia Type:General  Level of Consciousness: drowsy and patient cooperative  Airway & Oxygen Therapy: Patient Spontanous Breathing and Patient connected to face mask oxygen  Post-op Assessment: Report given to RN, Post -op Vital signs reviewed and stable, and Patient moving all extremities X 4  Post vital signs: Reviewed and stable  Last Vitals:  Vitals Value Taken Time  BP 118/71 06/07/24 1436  Temp 97.6   Pulse 89 06/07/24 1438  Resp 15 06/07/24 1438  SpO2 100 % 06/07/24 1438  Vitals shown include unfiled device data.  Last Pain:  Vitals:   06/07/24 0850  TempSrc: Oral  PainSc: 3          Complications: No notable events documented.

## 2024-06-07 NOTE — Op Note (Signed)
 06/07/2024  5:58 PM  PATIENT:  Audrey [name]  38 y.o. female  PRE-OPERATIVE DIAGNOSIS:  Menorrhagia with irregular cycle  POST-OPERATIVE DIAGNOSIS:  Menorrhagia with irregular cycle  PROCEDURE:  Procedure(s): LYSIS, ADHESIONS, LAPAROSCOPIC  SURGEON:  Lillian Rein  ASSISTANTS: Dr. Raynell Caller.  An experienced assistant was required given the standard of surgical care given the complexity of the case.  This assistant was needed for exposure, dissection, suctioning, retraction, instrument exchange and for overall help during the procedure.  RNFA help was also unavailable.  ANESTHESIA:   general  ESTIMATED BLOOD LOSS: 10 mL  BLOOD ADMINISTERED:none   FLUIDS: 1400cc  UOP: 250cc clear UOP  SPECIMEN:  none  DISPOSITION OF SPECIMEN:  N/A  FINDINGS: significant abdominal and pelvic adhesions.  Omental adhesions to the anterior abdominal wall and to the fundus and right and left adnexa.  Left ovary appears adhered to the uterus, both fallopian tubes are mildly enlarged c/w mild hydrosalpinx.  Bladder is densely adhered to the uterus above the lower uterine segment.    DESCRIPTION OF OPERATION: Patient is taken to the operating room. She is placed in the supine position. She is a running IV in place. Informed consent was present on the chart. SCDs on her lower extremities and functioning properly. Patient was positioned while she was awake.  Her legs were placed in the low lithotomy position in [place] stirrups. Her arms were tucked by the side.  General endotracheal anesthesia was administered by the anesthesia staff without difficulty. Dr. Gail Joseph, anesthesia, oversaw case.  Time out performed.    Clora prep was then used to prep the abdomen (x2) and Hibiclens was used to prep the inner thighs, perineum and vagina. Once 3 minutes had past the patient was draped in a normal standard fashion. The legs were lifted to the high lithotomy position. The cervix was visualized by placing a heavy  weighted speculum in the posterior aspect of the vagina and using a curved Deaver retractor to the retract anteriorly. The anterior lip of the cervix was grasped with single-tooth tenaculum.  The cervix sounded to 8 cm. Pratt dilators were used to dilate the cervix up to a #21. A RUMI uterine manipulator was obtained. A #8 disposable tip was placed on the RUMI manipulator as well as a 3.0, silver KOH ring. This was passed through the cervix but only partly.  The entire tip would not pass.  Cervix was dilated to a #24 but the tip still wouldn't pass.  A smaller #6 tip was obtained.  Although this would pass, when the balloon was inflated, the tip came back out of the cervix indicating this was too short and the long one was needed.  Given concerns about uterine perforation, decision made to place laparoscopic ports and then pass the instrument while visualizing from the inside.    The speculum and tenaculum were removed. A Foley catheter was placed to straight drain.  Clear urine was noted. Legs were lowered to the low lithotomy position and attention was turned the abdomen.  Attention was turned to the LUQ.  OG tube placed by anesthesia.  Port location site was marked prior to skin prep.  Incision made with #11 blade in the mid clavicular line, 2cm beneath the costal margin.  5mm trochar passed with direct visualization using an optiview port.  Intraabdominal placement was confirmed.  Pneumoperitoneum was achieved.  Gas placed on high flow.  Pt was placed in Trendelenburg positioning.  Significant omental adhesions were noted to  the anterior abdominal wall and appeared to be adhere to the uterus as well.  I could not actually see the uterus at this point.  With manipulation using atraumatic grasper, only the left fallopian tube could be seen.    Locations for RLQ, LLQ, and suprapubic ports were noted by transillumination of the abdominal wall.  0.25% marcaine  was used to anesthetize the skin.  8mm skin  incision was made in the RLQ and an AirSeal port was placed underdirect visualization of the laparoscope.  Then a 5mm skin incision was made and a 5mm nonbladed trochar and port was placed in the LLQ.  Finally, and 8mm skin incision was made about 4cm above the pubic symphasis and an 8mm non-bladed port was placed with direct visualization of the laparoscope.  All trochars were removed.    Decision made to lyse adhesions to help with visualization of normal anatomy.  Using ligasure device and the Harmonic scalpel, both dense and filmy omental adhesions were lysed by starting at the umbilicus and continuing down towards the bladder.  After more than 120 minutes, the right ovary was visualized as was the left ovary.  The left ovary was adhered to the side left side of the uterus.  Both fallopian tubes were mildly dilated and adhesions were present.  There were still omental adhesions present covering portions of the uterus and still significant scarring where the bladder was pulled up to the mid portion of the uterus.  The posterior cul de sac was well visualized and the colon appeared normal.  At this point, better visualization of the bladder was needed so decision made to try and place the RUMI uterine manipulator.    Attention turned to the vagina.  Speculum placed.  Anterior lip of cervix grasped with a single toothed tenaculum.  Uterus sounded to 8cm again.  #24 dilator passed.  At this point the #8 Rumi tip could be passed.  The bulb inflated and the KOH ring fit well around the cervix.  Instruments removed.    Attention turned back to the laparoscopic portion of the procedure.  With the uterine manipulator present, it was evident that the bladder scarring was more dense as there was not significant improvement in visualization of manipulation of the uterus.  Given the amount of time already spent on the procedure as well as risks for injury, decision made to stop the procedure.  At this point there had  not been any significant bleeding and there was no organ injury.    Pt was taken out of Trendelenburg position.   The pneumoperitoneum was relieved.  Two ports were removed and several deep breaths were given to the patient's trying to any gas the abdomen and the final port was removed.  The skin was then closed with subcuticular stitches of 3-0 Vicryl. The skin was cleansed Dermabond was applied. Attention was then turned the vagina and the instruments removed.  Small amount of bleeding at the cervix was made hemostatic with silver nitrate.  Foley catheter was removed.  Sponge, lap, needle, instrument counts were correct x2. Patient tolerated the procedure very well. She was awakened from anesthesia, extubated and taken to recovery in stable condition.   COUNTS:  YES  PLAN OF CARE: Transfer to PACU

## 2024-06-07 NOTE — Transfer of Care (Deleted)
 Immediate Anesthesia Transfer of Care Note  Patient: Audrey [name]  Procedure(s) Performed: HYSTERECTOMY, TOTAL, LAPAROSCOPIC, WITH BILATERAL SALPINGO-OOPHORECTOMY (Bilateral) CYSTOSCOPY  Patient Location: PACU  Anesthesia Type:General  Level of Consciousness: drowsy and patient cooperative  Airway & Oxygen Therapy: Patient Spontanous Breathing  Post-op Assessment: Report given to RN and Patient moving all extremities X 4  Post vital signs: Reviewed and stable  Last Vitals:  Vitals Value Taken Time  BP 112/72   Temp 97.7   Pulse 70   Resp 15   SpO2 100     Last Pain:  Vitals:   06/07/24 0850  TempSrc: Oral  PainSc: 3          Complications: No notable events documented.

## 2024-06-07 NOTE — H&P (Signed)
 Audrey [name] is an 38 y.o. female G3P2 SAAF with hx of menorrhagia here for definitive treatment of menorrhagia with TLH/bilateral salpingectomy, possible oophorectomy, possible ovarian cyst removal, cystoscopy.  Pt has significant menorrhagia requiring multiple IV iron  infusions to treat her iron  deficiency anemia.  She's had three c sections and her c section scar is about 5mm thick.  She was interested in endometrial ablation but due to concerns about bladder injury, I did not feel comfortable proceed with this.  Her evaluation has included ultrasound showing uterus measuring 8 x 5 x 4cm.  Endometrium 6.72mm.  there was a complex cyst on the left ovary measuring 2.2cm.  Endometrial biopsy was negative on 05/25/2204.  HbA1C is normal.    We have reviewed risks and benefits.  Pt understands positioning for this surgery and if she does not tolerated the positioning, would stop the procedure.  Also, she understand if there is significant adhesions that I felt significantly increased risks for her, then would not proceed as well.  Due to her three c sections there is increased risk for bladder injury.  We have discussed this very clearly and she is aware.  Other risks reviewed and document in office note from 5/28.    Pertinent Gynecological History: Menses: menorrhagia Contraception: BLT 2015 DES exposure: denies Blood transfusions: none Sexually transmitted diseases: no past history Previous GYN Procedures: c section x 3  Last mammogram: n/a Last pap: normal Date: 02/19/2024 OB History: G3, P2   Menstrual History: Patient's last menstrual period was 05/31/2024 (approximate).    Past Medical History:  Diagnosis Date   Anemia    r/t uterine bleeding   Arthritis    Asthma    Atypical chest pain 10/03/2020   Common migraine with intractable migraine 01/23/2017   Dandruff 03/24/2023   Fetal demise 05/19/2013   GERD (gastroesophageal reflux disease)    Gestational diabetes    Hx, A1C 4.9  04/2024   Headache    migraines   Hemorrhage after delivery of fetus 08/12/2006   Morbidly obese (HCC) 01/23/2017   Motion sickness 08/27/2023   Preeclampsia    Pregnancy induced hypertension    S/P cesarean section 05/20/2013   S/P cesarean section 11/18/2014   Sleep apnea 2025    Past Surgical History:  Procedure Laterality Date   CESAREAN SECTION  07/2006   CESAREAN SECTION N/A 05/19/2013   Procedure: CESAREAN SECTION;  Surgeon: Chrystal Crape, MD;  Location: [facility];  Service: Obstetrics;  Laterality: N/A;   CESAREAN SECTION WITH BILATERAL TUBAL LIGATION Bilateral 11/17/2014   Procedure: CESAREAN SECTION WITH BILATERAL TUBAL LIGATION;  Surgeon: Margaretmary Shaver, MD;  Location: [facility];  Service: Obstetrics;  Laterality: Bilateral;   TOENAIL EXCISION     WISDOM TOOTH EXTRACTION      Family History  Adopted: Yes  Problem Relation Age of Onset   Diabetes Mother    Asthma Other    Hyperlipidemia Other    Obesity Other    Diabetes Maternal Grandmother    Hypertension Maternal Grandmother    Diabetes Paternal Grandmother    Hypertension Paternal Grandmother     Social History:  reports that she has never smoked. She has never used smokeless tobacco. She reports that she does not drink alcohol and does not use drugs.  Allergies:  Allergies  Allergen Reactions   Sumatriptan Nausea Only    Dizziness     Medications Prior to Admission  Medication Sig Dispense Refill Last Dose/Taking   acetaminophen  (TYLENOL ) 325 MG  tablet Take 650 mg by mouth every 6 (six) hours as needed.   Past Week   albuterol  (PROVENTIL ) (2.5 MG/3ML) 0.083% nebulizer solution Take 3 mLs (2.5 mg total) by nebulization every 6 (six) hours as needed for wheezing or shortness of breath. 75 mL 3 Taking As Needed   albuterol  (VENTOLIN  HFA) 108 (90 Base) MCG/ACT inhaler Inhale 1-2 puffs into the lungs every 6 (six) hours as needed for wheezing or shortness of breath. 6.7 g 1 Unknown    aspirin-acetaminophen -caffeine (EXCEDRIN MIGRAINE) 250-250-65 MG tablet Take 1 tablet by mouth every 6 (six) hours as needed for headache.   Past Month   azelastine  (ASTELIN ) 0.1 % nasal spray Place 2 sprays into each nostril 2 (two) times daily as directed. (Patient taking differently: Place 2 sprays into both nostrils 2 (two) times daily as needed for allergies.) 30 mL 12 Unknown   budesonide  (PULMICORT ) 0.25 MG/2ML nebulizer solution Take 2 mLs (0.25 mg total) by nebulization daily. For bronchitis/asthma exacerbation. (Patient taking differently: Take 0.25 mg by nebulization daily as needed (shortness of breath). For bronchitis/asthma exacerbation.) 30 mL 12 Unknown   cyclobenzaprine  (FLEXERIL ) 10 MG tablet Take 1 tablet (10 mg total) by mouth 3 (three) times daily as needed for muscle spasms. 30 tablet 0 Unknown   dicyclomine  (BENTYL ) 10 MG capsule Take 1 capsule (10 mg total) by mouth 4 (four) times daily - before meals and at bedtime. (Patient taking differently: Take 10 mg by mouth 4 (four) times daily as needed for spasms.) 90 capsule 2 Past Week   Drospirenone  (SLYND ) 4 MG TABS Take 1 tablet (4 mg total) by mouth daily. 28 tablet 3 Unknown   famotidine  (PEPCID ) 20 MG tablet Take 1 tablet (20 mg total) by mouth at bedtime. 30 tablet 11 Past Week   Ferrous Sulfate  (ONE VITE FERROUS SULFATE ) 300 MG/6.8ML SOLN Take 5 mLs (220mg  total) by mouth daily. 473 mL 11 Past Week   fluticasone  furoate-vilanterol (BREO ELLIPTA ) 200-25 MCG/ACT AEPB Inhale 1 puff into the lungs daily.   06/07/2024 at  7:30 AM   ibuprofen  (ADVIL ) 200 MG tablet Take 400-800 mg by mouth every 6 (six) hours as needed for moderate pain (pain score 4-6).   Past Month   levocetirizine (XYZAL ) 5 MG tablet Take 1 tablet (5 mg total) by mouth every evening. (Patient taking differently: Take 5 mg by mouth daily as needed for allergies.) 90 tablet 3 Taking Differently   mometasone  (ELOCON ) 0.1 % cream Apply a thin layer to the affected area  twice a day. (Patient taking differently: Apply 1 Application topically 2 (two) times daily as needed (itching).) 45 g 1 Unknown   pantoprazole  (PROTONIX ) 40 MG tablet Take 1 tablet (40 mg total) by mouth 2 (two) times daily. Drop down to once a day when symptoms controlled. (Patient taking differently: Take 40 mg by mouth daily.) 60 tablet 3 06/07/2024 at  7:30 AM   traMADol  (ULTRAM ) 50 MG tablet Take 1 tablet (50 mg total) by mouth every 8 (eight) hours as needed. 20 tablet 0 Unknown   valACYclovir  (VALTREX ) 1000 MG tablet Take 1,000 mg by mouth daily as needed (outbreaks).   Unknown   Vitamin D , Ergocalciferol , (DRISDOL ) 1.25 MG (50000 UNIT) CAPS capsule Take 1 capsule (50,000 Units total) by mouth every 7 (seven) days. (Patient taking differently: Take 50,000 Units by mouth 2 (two) times a week.) 12 capsule 3 Past Week   budesonide -formoterol  (SYMBICORT ) 160-4.5 MCG/ACT inhaler Inhale 2 puffs into the lungs 2 (two)  times daily. 10.2 g 12    Ferrous Sulfate  5 MG/20ML SOLN Take 5 mLs by mouth daily. (Patient not taking: Reported on 05/30/2024) 473 mL 3 Not Taking   Fremanezumab -vfrm (AJOVY ) 225 MG/1.5ML SOAJ Inject 225 mg into the skin every 30 (thirty) days. (Patient not taking: Reported on 05/30/2024) 1.5 mL 3 Not Taking   ketoconazole  (NIZORAL ) 2 % shampoo Apply 1 Application topically 2 (two) times a week. (Patient not taking: Reported on 05/30/2024) 120 mL 5 Not Taking    Review of Systems  Constitutional: Negative.   Respiratory: Negative.    Cardiovascular: Negative.   Genitourinary:        Menorrhagia  Psychiatric/Behavioral: Negative.      Blood pressure 139/86, pulse 79, temperature 98.3 F (36.8 C), temperature source Oral, resp. rate 17, height 5\' 8"  (1.727 m), weight 127 kg, last menstrual period 05/31/2024, SpO2 98%. Physical Exam Constitutional:      Appearance: Normal appearance.  Cardiovascular:     Rate and Rhythm: Normal rate and regular rhythm.  Pulmonary:     Effort:  Pulmonary effort is normal.     Breath sounds: Normal breath sounds.  Neurological:     General: No focal deficit present.     Mental Status: She is alert.  Psychiatric:        Mood and Affect: Mood normal.     Results for orders placed or performed during the hospital encounter of 06/07/24 (from the past 24 hours)  Pregnancy, urine POC     Status: None   Collection Time: 06/07/24  8:36 AM  Result Value Ref Range   Preg Test, Ur NEGATIVE NEGATIVE    No results found.  Assessment/Plan: 38 yo G3P2 SAA female with menorrhagia, h/o iron  deficiency anemia, h/o three prior c sections here for definitive treatment.  Procedure, risks and benefits reviewed.  Questions answered.  Pt ready to proceed.    Lillian Rein 06/07/2024, 10:26 AM

## 2024-06-08 ENCOUNTER — Encounter (HOSPITAL_COMMUNITY): Payer: Self-pay | Admitting: Obstetrics & Gynecology

## 2024-06-08 NOTE — Anesthesia Postprocedure Evaluation (Signed)
 Anesthesia Post Note  Patient: Audrey [name]  Procedure(s) Performed: LYSIS, ADHESIONS, LAPAROSCOPIC (Abdomen)     Patient location during evaluation: PACU Anesthesia Type: General Level of consciousness: awake and alert Pain management: pain level controlled Vital Signs Assessment: post-procedure vital signs reviewed and stable Respiratory status: spontaneous breathing, nonlabored ventilation, respiratory function stable and patient connected to nasal cannula oxygen Cardiovascular status: blood pressure returned to baseline and stable Postop Assessment: no apparent nausea or vomiting Anesthetic complications: no  No notable events documented.  Last Vitals:  Vitals:   06/07/24 1606 06/07/24 1645  BP:    Pulse: 97 77  Resp: (!) 29 18  Temp: 36.6 C   SpO2: 92% 92%    Last Pain:  Vitals:   06/07/24 0850  TempSrc: Oral  PainSc: 3    Pain Goal:                   Fulton Antolin L Margarita Bobrowski

## 2024-06-14 ENCOUNTER — Encounter (HOSPITAL_BASED_OUTPATIENT_CLINIC_OR_DEPARTMENT_OTHER): Payer: Self-pay | Admitting: Obstetrics & Gynecology

## 2024-06-14 ENCOUNTER — Ambulatory Visit (HOSPITAL_BASED_OUTPATIENT_CLINIC_OR_DEPARTMENT_OTHER): Payer: Self-pay | Admitting: Obstetrics & Gynecology

## 2024-06-14 ENCOUNTER — Other Ambulatory Visit (HOSPITAL_COMMUNITY): Payer: Self-pay

## 2024-06-14 VITALS — BP 137/84 | HR 72 | Ht 68.0 in | Wt 278.4 lb

## 2024-06-14 DIAGNOSIS — N736 Female pelvic peritoneal adhesions (postinfective): Secondary | ICD-10-CM

## 2024-06-14 DIAGNOSIS — N921 Excessive and frequent menstruation with irregular cycle: Secondary | ICD-10-CM

## 2024-06-14 DIAGNOSIS — K625 Hemorrhage of anus and rectum: Secondary | ICD-10-CM

## 2024-06-14 MED ORDER — TRANEXAMIC ACID 650 MG PO TABS
1300.0000 mg | ORAL_TABLET | Freq: Three times a day (TID) | ORAL | 2 refills | Status: AC
  Filled 2024-06-14: qty 30, 5d supply, fill #0
  Filled 2024-10-21: qty 30, 5d supply, fill #1

## 2024-06-16 DIAGNOSIS — N736 Female pelvic peritoneal adhesions (postinfective): Secondary | ICD-10-CM | POA: Insufficient documentation

## 2024-06-16 DIAGNOSIS — K625 Hemorrhage of anus and rectum: Secondary | ICD-10-CM

## 2024-06-16 HISTORY — DX: Hemorrhage of anus and rectum: K62.5

## 2024-06-16 NOTE — Progress Notes (Signed)
 GYNECOLOGY  VISIT  CC:   post op recheck  HPI: 38 y.o. G56P3002 Single Black or African American female here for recheck after undergoing laparoscopy with significant LOA on 06/07/2024.  Hysterectomy was planned but she had significant adhesions from prior c sections.  Ultimately procedure was ended.  I did speak with her in the recovery room about the procedure.    Reports pain is minimal.  Took pain medications for a few days.  Voiding and bowel function is normal.    We discussed findings.  Pictures shown to pt.  We did discuss possible IUD use as well as [facility].  She thinks, for now, she is just going to not do anything but will consider options.     Unrelated, she was has hx of rectal bleeding.  Has not been called by GI but referral was placed.  Will place again today and number was given.   MEDS:   Current Outpatient Medications on File Prior to Visit  Medication Sig Dispense Refill   acetaminophen  (TYLENOL ) 325 MG tablet Take 650 mg by mouth every 6 (six) hours as needed.     albuterol  (PROVENTIL ) (2.5 MG/3ML) 0.083% nebulizer solution Take 3 mLs (2.5 mg total) by nebulization every 6 (six) hours as needed for wheezing or shortness of breath. 75 mL 3   albuterol  (VENTOLIN  HFA) 108 (90 Base) MCG/ACT inhaler Inhale 1-2 puffs into the lungs every 6 (six) hours as needed for wheezing or shortness of breath. 6.7 g 1   aspirin-acetaminophen -caffeine (EXCEDRIN MIGRAINE) 250-250-65 MG tablet Take 1 tablet by mouth every 6 (six) hours as needed for headache.     azelastine  (ASTELIN ) 0.1 % nasal spray Place 2 sprays into each nostril 2 (two) times daily as directed. (Patient taking differently: Place 2 sprays into both nostrils 2 (two) times daily as needed for allergies.) 30 mL 12   budesonide  (PULMICORT ) 0.25 MG/2ML nebulizer solution Take 2 mLs (0.25 mg total) by nebulization daily. For bronchitis/asthma exacerbation. (Patient taking differently: Take  0.25 mg by nebulization daily as needed (shortness of breath). For bronchitis/asthma exacerbation.) 30 mL 12   budesonide -formoterol  (SYMBICORT ) 160-4.5 MCG/ACT inhaler Inhale 2 puffs into the lungs 2 (two) times daily. 10.2 g 12   cyclobenzaprine  (FLEXERIL ) 10 MG tablet Take 1 tablet (10 mg total) by mouth 3 (three) times daily as needed for muscle spasms. 30 tablet 0   dicyclomine  (BENTYL ) 10 MG capsule Take 1 capsule (10 mg total) by mouth 4 (four) times daily - before meals and at bedtime. (Patient taking differently: Take 10 mg by mouth 4 (four) times daily as needed for spasms.) 90 capsule 2   Drospirenone  (SLYND ) 4 MG TABS Take 1 tablet (4 mg total) by mouth daily. 28 tablet 3   famotidine  (PEPCID ) 20 MG tablet Take 1 tablet (20 mg total) by mouth at bedtime. 30 tablet 11   Ferrous Sulfate  (ONE VITE FERROUS SULFATE ) 300 MG/6.8ML SOLN Take 5 mLs (220mg  total) by mouth daily. 473 mL 11   Ferrous Sulfate  5 MG/20ML SOLN Take 5 mLs by mouth daily. (Patient not taking: Reported on 05/30/2024) 473 mL 3   fluticasone  furoate-vilanterol (BREO ELLIPTA ) 200-25 MCG/ACT AEPB Inhale 1 puff into the lungs daily.     Fremanezumab -vfrm (AJOVY ) 225 MG/1.5ML SOAJ Inject 225 mg into the skin every 30 (thirty) days. (Patient not taking: Reported on 05/30/2024) 1.5 mL 3   HYDROcodone -acetaminophen  (NORCO/VICODIN) 5-325 MG tablet Take 1-2 tablets by mouth every  6 (six) hours as needed for moderate pain (pain score 4-6). 20 tablet 0   ibuprofen  (ADVIL ) 800 MG tablet Take 1 tablet (800 mg total) by mouth every 8 (eight) hours as needed. 30 tablet 0   ketoconazole  (NIZORAL ) 2 % shampoo Apply 1 Application topically 2 (two) times a week. (Patient not taking: Reported on 05/30/2024) 120 mL 5   levocetirizine (XYZAL ) 5 MG tablet Take 1 tablet (5 mg total) by mouth every evening. (Patient taking differently: Take 5 mg by mouth daily as needed for allergies.) 90 tablet 3   mometasone  (ELOCON ) 0.1 % cream Apply a thin layer to the  affected area twice a day. (Patient taking differently: Apply 1 Application topically 2 (two) times daily as needed (itching).) 45 g 1   pantoprazole  (PROTONIX ) 40 MG tablet Take 1 tablet (40 mg total) by mouth 2 (two) times daily. Drop down to once a day when symptoms controlled. (Patient taking differently: Take 40 mg by mouth daily.) 60 tablet 3   traMADol  (ULTRAM ) 50 MG tablet Take 1 tablet (50 mg total) by mouth every 8 (eight) hours as needed. 20 tablet 0   valACYclovir  (VALTREX ) 1000 MG tablet Take 1,000 mg by mouth daily as needed (outbreaks).     Vitamin D , Ergocalciferol , (DRISDOL ) 1.25 MG (50000 UNIT) CAPS capsule Take 1 capsule (50,000 Units total) by mouth every 7 (seven) days. (Patient taking differently: Take 50,000 Units by mouth 2 (two) times a week.) 12 capsule 3   No current facility-administered medications on file prior to visit.    SH:  Smoking No    PHYSICAL EXAMINATION:    BP 137/84   Pulse 72   Ht 5' 8 (1.727 m) Comment: Reported  Wt 278 lb 6.4 oz (126.3 kg)   LMP 05/31/2024 (Approximate)   BMI 42.33 kg/m     General appearance: alert, cooperative and appears stated age Abdomen: soft, non-tender; bowel sounds normal; no masses,  no organomegaly Incisions:  C/D/I  Assessment/Plan: 1. Pelvic adhesions (Primary)  2. Rectal bleeding - Ambulatory referral to Gastroenterology  3. Menorrhagia with irregular cycle - will stay on oral iron 

## 2024-07-12 ENCOUNTER — Other Ambulatory Visit (HOSPITAL_COMMUNITY): Payer: Self-pay

## 2024-07-12 ENCOUNTER — Ambulatory Visit (HOSPITAL_BASED_OUTPATIENT_CLINIC_OR_DEPARTMENT_OTHER): Payer: Self-pay | Admitting: Obstetrics & Gynecology

## 2024-07-12 VITALS — BP 94/73 | HR 76 | Wt 277.4 lb

## 2024-07-12 DIAGNOSIS — Z9889 Other specified postprocedural states: Secondary | ICD-10-CM

## 2024-07-12 DIAGNOSIS — N736 Female pelvic peritoneal adhesions (postinfective): Secondary | ICD-10-CM

## 2024-07-12 DIAGNOSIS — Z8742 Personal history of other diseases of the female genital tract: Secondary | ICD-10-CM

## 2024-07-12 MED ORDER — NORETHINDRONE ACETATE 5 MG PO TABS
5.0000 mg | ORAL_TABLET | Freq: Every day | ORAL | 0 refills | Status: DC
  Filled 2024-07-12 – 2024-08-11 (×2): qty 90, 90d supply, fill #0

## 2024-07-12 NOTE — Patient Instructions (Addendum)
 Hart Paddy, MD, at [facility]  Lorrene DOROTHA Marking, MD, at [facility], MD, at [facility]

## 2024-07-19 ENCOUNTER — Encounter (HOSPITAL_BASED_OUTPATIENT_CLINIC_OR_DEPARTMENT_OTHER): Payer: Self-pay | Admitting: Obstetrics & Gynecology

## 2024-07-19 NOTE — Progress Notes (Signed)
 CC:  h/o menorrhagia.  Post op recheck.  HPI: 38 y.o. [id number] Single Black or African American female here for discussion of options for bleeding.  We have discussed referral to [facility] surgeon given amount of adhesions present at the time of surgery.  We discussed options and names given for her to do some research.  She will let me know if desires referral.  We discussed treatment with daily progesterone.  She has tried Slynd  but stopped.  Higher dosage of norethindrone  discussed.  Will try 5mg  daily.  Discussed with pt if she starts to have heavy spotting, can stop, and if needed then use TXA. She does have an prescription for this but will need norethindrone  rx.  We did discuss IUD use but she really does not want this.  Off pain medication from laparoscopy.  Bowel and bladder function normal.  Question answered.     Past Medical History:  Diagnosis Date   Anemia    r/t uterine bleeding   Arthritis    Asthma    Atypical chest pain 10/03/2020   Common migraine with intractable migraine 01/23/2017   Dandruff 03/24/2023   Fetal demise 05/19/2013   GERD (gastroesophageal reflux disease)    Gestational diabetes    Hx, A1C 4.9 04/2024   Headache    migraines   Hemorrhage after delivery of fetus 08/12/2006   Morbidly obese (HCC) 01/23/2017   Motion sickness 08/27/2023   Preeclampsia    Pregnancy induced hypertension    S/P cesarean section 05/20/2013   S/P cesarean section 11/18/2014   Sleep apnea 2025    MEDS:   Current Outpatient Medications on File Prior to Visit  Medication Sig Dispense Refill   acetaminophen  (TYLENOL ) 325 MG tablet Take 650 mg by mouth every 6 (six) hours as needed.     albuterol  (PROVENTIL ) (2.5 MG/3ML) 0.083% nebulizer solution Take 3 mLs (2.5 mg total) by nebulization every 6 (six) hours as needed for wheezing or shortness of breath. 75 mL 3   albuterol  (VENTOLIN  HFA) 108 (90 Base) MCG/ACT inhaler Inhale 1-2 puffs into the lungs every 6 (six) hours as  needed for wheezing or shortness of breath. 6.7 g 1   aspirin-acetaminophen -caffeine (EXCEDRIN MIGRAINE) 250-250-65 MG tablet Take 1 tablet by mouth every 6 (six) hours as needed for headache.     azelastine  (ASTELIN ) 0.1 % nasal spray Place 2 sprays into each nostril 2 (two) times daily as directed. (Patient taking differently: Place 2 sprays into both nostrils 2 (two) times daily as needed for allergies.) 30 mL 12   budesonide  (PULMICORT ) 0.25 MG/2ML nebulizer solution Take 2 mLs (0.25 mg total) by nebulization daily. For bronchitis/asthma exacerbation. (Patient taking differently: Take 0.25 mg by nebulization daily as needed (shortness of breath). For bronchitis/asthma exacerbation.) 30 mL 12   budesonide -formoterol  (SYMBICORT ) 160-4.5 MCG/ACT inhaler Inhale 2 puffs into the lungs 2 (two) times daily. 10.2 g 12   cyclobenzaprine  (FLEXERIL ) 10 MG tablet Take 1 tablet (10 mg total) by mouth 3 (three) times daily as needed for muscle spasms. 30 tablet 0   dicyclomine  (BENTYL ) 10 MG capsule Take 1 capsule (10 mg total) by mouth 4 (four) times daily - before meals and at bedtime. (Patient taking differently: Take 10 mg by mouth 4 (four) times daily as needed for spasms.) 90 capsule 2   famotidine  (PEPCID ) 20 MG tablet Take 1 tablet (20 mg total) by mouth at bedtime. 30 tablet 11   Ferrous Sulfate  (ONE VITE FERROUS SULFATE ) 300 MG/6.8ML  SOLN Take 5 mLs (220mg  total) by mouth daily. 473 mL 11   Ferrous Sulfate  5 MG/20ML SOLN Take 5 mLs by mouth daily. (Patient not taking: Reported on 05/30/2024) 473 mL 3   fluticasone  furoate-vilanterol (BREO ELLIPTA ) 200-25 MCG/ACT AEPB Inhale 1 puff into the lungs daily.     Fremanezumab -vfrm (AJOVY ) 225 MG/1.5ML SOAJ Inject 225 mg into the skin every 30 (thirty) days. (Patient not taking: Reported on 05/30/2024) 1.5 mL 3   levocetirizine (XYZAL ) 5 MG tablet Take 1 tablet (5 mg total) by mouth every evening. (Patient taking differently: Take 5 mg by mouth daily as needed for  allergies.) 90 tablet 3   mometasone  (ELOCON ) 0.1 % cream Apply a thin layer to the affected area twice a day. (Patient taking differently: Apply 1 Application topically 2 (two) times daily as needed (itching).) 45 g 1   pantoprazole  (PROTONIX ) 40 MG tablet Take 1 tablet (40 mg total) by mouth 2 (two) times daily. Drop down to once a day when symptoms controlled. (Patient taking differently: Take 40 mg by mouth daily.) 60 tablet 3   traMADol  (ULTRAM ) 50 MG tablet Take 1 tablet (50 mg total) by mouth every 8 (eight) hours as needed. 20 tablet 0   tranexamic acid  (LYSTEDA ) 650 MG TABS tablet Take 2 tablets (1,300 mg total) by mouth 3 (three) times daily. 30 tablet 2   valACYclovir  (VALTREX ) 1000 MG tablet Take 1,000 mg by mouth daily as needed (outbreaks).     Vitamin D , Ergocalciferol , (DRISDOL ) 1.25 MG (50000 UNIT) CAPS capsule Take 1 capsule (50,000 Units total) by mouth every 7 (seven) days. (Patient taking differently: Take 50,000 Units by mouth 2 (two) times a week.) 12 capsule 3   No current facility-administered medications on file prior to visit.    ALLERGIES: Sumatriptan  SH:  Single, non smoker  Review of Systems  Constitutional: Negative.     PHYSICAL EXAMINATION:    BP 94/73 (BP Location: Right Arm, Patient Position: Sitting, Cuff Size: Large)   Pulse 76   Wt 277 lb 6.4 oz (125.8 kg)   SpO2 96%   BMI 42.18 kg/m     General appearance: alert, cooperative and appears stated age Abdomen: soft, non-tender; incisions c/d/i   Assessment/Plan: 1. History of laparoscopy (Primary) - pt is considering if wants to see [facility] surgeon  2. Pelvic adhesions  3. History of menorrhagia - rx for norethindrone  5mg  daily to pharmacy.

## 2024-07-21 ENCOUNTER — Other Ambulatory Visit (HOSPITAL_COMMUNITY): Payer: Self-pay

## 2024-08-03 ENCOUNTER — Encounter (HOSPITAL_BASED_OUTPATIENT_CLINIC_OR_DEPARTMENT_OTHER): Payer: Self-pay | Admitting: Obstetrics & Gynecology

## 2024-08-04 ENCOUNTER — Other Ambulatory Visit (HOSPITAL_BASED_OUTPATIENT_CLINIC_OR_DEPARTMENT_OTHER): Payer: Self-pay | Admitting: Obstetrics & Gynecology

## 2024-08-04 DIAGNOSIS — N736 Female pelvic peritoneal adhesions (postinfective): Secondary | ICD-10-CM

## 2024-08-04 DIAGNOSIS — Z6841 Body Mass Index (BMI) 40.0 and over, adult: Secondary | ICD-10-CM

## 2024-08-04 DIAGNOSIS — N921 Excessive and frequent menstruation with irregular cycle: Secondary | ICD-10-CM

## 2024-08-04 DIAGNOSIS — D500 Iron deficiency anemia secondary to blood loss (chronic): Secondary | ICD-10-CM

## 2024-08-05 ENCOUNTER — Telehealth (HOSPITAL_BASED_OUTPATIENT_CLINIC_OR_DEPARTMENT_OTHER): Payer: Self-pay

## 2024-08-05 NOTE — Telephone Encounter (Signed)
-----   Message from Ronal GORMAN Pinal sent at 08/04/2024  6:28 AM EDT ----- Regarding: referral Braylynn Ghan, Can you help me with this referral.  This pt needs to see a surgeon at [facility].  I've placed the referral.  It needs to be printed with the operative note and my last two visits.  Then it needs to be faxed with demographics.  Can you help me with this or when I come this afternoon, remind me please?  Thank you.  Elvie Pinal

## 2024-08-05 NOTE — Telephone Encounter (Signed)
 Documents printed and faxed to [facility] as requested.

## 2024-08-11 ENCOUNTER — Other Ambulatory Visit (HOSPITAL_COMMUNITY): Payer: Self-pay

## 2024-08-12 ENCOUNTER — Encounter: Payer: Self-pay | Admitting: Nurse Practitioner

## 2024-08-12 ENCOUNTER — Other Ambulatory Visit (HOSPITAL_COMMUNITY): Payer: Self-pay

## 2024-08-12 ENCOUNTER — Other Ambulatory Visit: Payer: Self-pay | Admitting: Nurse Practitioner

## 2024-08-12 DIAGNOSIS — B009 Herpesviral infection, unspecified: Secondary | ICD-10-CM

## 2024-08-12 MED ORDER — VALACYCLOVIR HCL 1 G PO TABS
2000.0000 mg | ORAL_TABLET | Freq: Two times a day (BID) | ORAL | 5 refills | Status: AC
  Filled 2024-08-12: qty 20, 5d supply, fill #0

## 2024-08-16 ENCOUNTER — Telehealth: Payer: Self-pay

## 2024-08-16 NOTE — Telephone Encounter (Signed)
 Patient returned missed call

## 2024-08-16 NOTE — Telephone Encounter (Signed)
 Called pt. Had to LM. Offered pt. Virtual appt. Tomorrow, Thursday and or Friday.   Copied from CRM #[id number]. Topic: Clinical - Medical Advice >> Aug 16, 2024  4:16 PM Leonette P wrote: Reason for CRM: pt is having a migraine and does not want to see anyone but Camie.  She has an appt in Seot 26th for a physical.  She will do a virtual.   She is wanting to know if there is anything that can be prescribed  CB#  [phone]

## 2024-08-17 ENCOUNTER — Telehealth: Admitting: Nurse Practitioner

## 2024-08-17 ENCOUNTER — Telehealth: Payer: Self-pay

## 2024-08-17 ENCOUNTER — Encounter: Payer: Self-pay | Admitting: Nurse Practitioner

## 2024-08-17 ENCOUNTER — Other Ambulatory Visit (HOSPITAL_COMMUNITY): Payer: Self-pay

## 2024-08-17 ENCOUNTER — Encounter (HOSPITAL_COMMUNITY): Payer: Self-pay

## 2024-08-17 VITALS — BP 132/88 | Wt 274.0 lb

## 2024-08-17 DIAGNOSIS — G43411 Hemiplegic migraine, intractable, with status migrainosus: Secondary | ICD-10-CM

## 2024-08-17 DIAGNOSIS — G43719 Chronic migraine without aura, intractable, without status migrainosus: Secondary | ICD-10-CM

## 2024-08-17 MED ORDER — AJOVY 225 MG/1.5ML ~~LOC~~ SOAJ
225.0000 mg | SUBCUTANEOUS | 11 refills | Status: DC
  Filled 2024-08-17 – 2024-08-22 (×2): qty 1.5, 30d supply, fill #0
  Filled 2024-09-17: qty 1.5, 30d supply, fill #1
  Filled 2024-10-21: qty 1.5, 30d supply, fill #2
  Filled 2024-11-16: qty 4.5, 90d supply, fill #3

## 2024-08-17 MED ORDER — NURTEC 75 MG PO TBDP
75.0000 mg | ORAL_TABLET | Freq: Every day | ORAL | 6 refills | Status: AC
  Filled 2024-08-17 – 2024-08-22 (×2): qty 15, 15d supply, fill #0
  Filled 2024-08-22: qty 8, 30d supply, fill #0
  Filled 2024-08-22: qty 15, 15d supply, fill #0

## 2024-08-17 NOTE — Patient Instructions (Signed)
  VISIT SUMMARY: Today, we discussed your worsening migraine headaches, which have been increasing in frequency and duration over the past few weeks. You experience migraines about four to five times a month, with each episode lasting up to 48 hours and accompanied by symptoms such as nausea, vomiting, sensitivity to light and sound, and dizziness.  YOUR PLAN: -INTRACTABLE MIGRAINE WITH STATUS MIGRAINOSUS: This means you have chronic migraines that are difficult to treat and have been worsening recently. We will restart Ajovy  for preventative therapy, which you should take once every 30 days. For immediate relief at the onset of a migraine, you will use Nurtec, and we have provided samples for you to start using right away. You can continue using over-the-counter medications like NSAIDs, acetaminophen , or Excedrin as needed. Additionally, you may consider using cyclobenzaprine  for muscle relaxation if necessary.  INSTRUCTIONS: Please follow up with us  if your symptoms do not improve or if you experience any side effects from the medications. Make sure to take Ajovy  once every 30 days and use Nurtec at the first sign of a migraine. Continue with over-the-counter medications as needed and consider cyclobenzaprine  for muscle relaxation if required.    Contains text generated by Abridge.                                 Contains text generated by Abridge.

## 2024-08-17 NOTE — Progress Notes (Signed)
 Virtual Visit Encounter mychart visit.   I connected with  Audrey [name] on 08/17/24 at  9:00 AM EDT by secure video and audio telemedicine application. I verified that I am speaking with the correct person using two identifiers.   I introduced myself as a Publishing rights manager with the practice. The limitations of evaluation and management by telemedicine discussed with the patient and the availability of in person appointments. The patient expressed verbal understanding and consent to proceed.  Participating parties in this visit include: Myself and patient  The patient is: Patient Location: Home I am: Provider Location: Office/Clinic Subjective:    CC and HPI:   History of Present Illness   Audrey [name] is a 38 year old female with a history of migraines who presents with worsening migraine headaches.  She has a long-standing history of migraine headaches that have worsened over the past few weeks, increasing in frequency and duration, with each episode lasting up to 48 hours. She experiences migraines approximately four to five times a month.  The migraines are characterized by pain that can move around her head, sometimes starting in one area and moving to another. Associated symptoms include nausea, vomiting, photophobia, phonophobia, dizziness, and a need to lie down during severe episodes. No specific triggers have been identified, and stress does not always correlate with her migraine episodes.  She has a known allergy to sumatriptan and has previously tried rizatriptan , Ajovy , and Qulipta. Ajovy  was effective initially but was discontinued as it stopped working. She cannot remember if she has tried other triptans. Currently, she uses Excedrin, which provides relief until a certain point, after which it becomes ineffective.   She has tried over-the-counter medications such as Tylenol , ibuprofen , and Excedrin.   Past medical history, Surgical history, Family history not pertinant  except as noted below, Social history, Allergies, and medications have been entered into the medical record, reviewed, and corrections made.   Review of Systems:  All review of systems negative except what is listed in the HPI  Objective:    Alert and oriented x 4 Speaking in clear sentences with no shortness of breath. No distress.  Impression and Recommendations:    Problem List Items Addressed This Visit     Migraine - Primary   Relevant Medications   Rimegepant Sulfate  (NURTEC) 75 MG TBDP   Fremanezumab -vfrm (AJOVY ) 225 MG/1.5ML SOAJ  Assessment and Plan    Intractable migraine with status migrainosus Chronic migraines have worsened over the past few weeks, with episodes lasting up to 48 hours, causing significant debilitation. Migraines are intractable, occurring approximately 4-5 times per month, with symptoms of nausea, vomiting, dizziness, and photophobia and phonophobia. Previous treatments include Ajovy , sumatriptan, rizatriptan , Qulipta, NSAIDs, and Excedrin. Ajovy  was discontinued previously due to perceived lack of efficacy, but she would like to retry this medication again. She is allergic to sumatriptan. Nurtec was effective in the past when provided samples. Due to increased blood pressure, do not recommend triptan therapy at this time.  - Restart Ajovy  for preventative therapy, administer once every 30 days. - Prescribe Nurtec for abortive therapy, to be taken at the first sign of migraine. - Provide Nurtec samples for immediate use. - Continue over-the-counter options such as NSAIDs, acetaminophen , or Excedrin. - Consider cyclobenzaprine  for muscle relaxation if needed.       orders and follow up as documented in EMR I discussed the assessment and treatment plan with the patient. The patient was provided an opportunity to ask questions and  all were answered. The patient agreed with the plan and demonstrated an understanding of the instructions.   The patient was  advised to call back or seek an in-person evaluation if the symptoms worsen or if the condition fails to improve as anticipated.  Follow-Up: prn  I provided 18 minutes of non-face-to-face interaction with this non face-to-face encounter including intake, same-day documentation, and chart review.   Camie CHARLENA Doing, NP , DNP, AGNP-c [facility]

## 2024-08-17 NOTE — Telephone Encounter (Signed)
Faxed completed PA form to [employer] at [phone] for Monovisc, bilateral knee. PA pending 

## 2024-08-18 ENCOUNTER — Telehealth: Payer: Self-pay | Admitting: Physician Assistant

## 2024-08-18 ENCOUNTER — Ambulatory Visit: Admitting: Physician Assistant

## 2024-08-18 ENCOUNTER — Other Ambulatory Visit (HOSPITAL_COMMUNITY): Payer: Self-pay

## 2024-08-18 ENCOUNTER — Other Ambulatory Visit: Payer: Self-pay

## 2024-08-18 DIAGNOSIS — M1712 Unilateral primary osteoarthritis, left knee: Secondary | ICD-10-CM

## 2024-08-18 DIAGNOSIS — M1711 Unilateral primary osteoarthritis, right knee: Secondary | ICD-10-CM

## 2024-08-18 NOTE — Telephone Encounter (Signed)
 Pt asked for a call back to make sure she is approved for gel injection for her upcoming appt 9/4. Please call pt at [phone].

## 2024-08-18 NOTE — Telephone Encounter (Signed)
 Talked with patient and advised her that she has been approved for gel injection.  Patient voiced that she understands

## 2024-08-19 ENCOUNTER — Telehealth: Payer: Self-pay

## 2024-08-19 NOTE — Telephone Encounter (Signed)
 Pharmacy Patient Advocate Encounter  Received notification from [employer] that Prior Authorization for AJOVY  225MG /ML has been APPROVED from 8.22.25 to 2.18.26. Ran test claim,  This test claim was processed through [facility]- copay amounts may vary at other pharmacies due to pharmacy/plan contracts, or as the patient moves through the different stages of their insurance plan.

## 2024-08-19 NOTE — Telephone Encounter (Signed)
 Pharmacy Patient Advocate Encounter   Received notification from Physician's Office that prior authorization for Nurtec 75MG  dispersible tablets  is required/requested.   Insurance verification completed.   The patient is insured through [facility] .   Per test claim: PA required; PA submitted to above mentioned insurance via Latent Key/confirmation #/EOC [id number] Status is pending

## 2024-08-19 NOTE — Telephone Encounter (Signed)
 Pharmacy Patient Advocate Encounter   Received notification from Physician's Office that prior authorization for AJOVY  is required/requested.   Insurance verification completed.   The patient is insured through [facility] .   Per test claim: PA required; PA submitted to above mentioned insurance via Latent Key/confirmation #/EOC [id number]  Status is pending

## 2024-08-22 ENCOUNTER — Other Ambulatory Visit (HOSPITAL_COMMUNITY): Payer: Self-pay

## 2024-08-22 ENCOUNTER — Encounter: Payer: Self-pay | Admitting: Hematology and Oncology

## 2024-08-23 ENCOUNTER — Other Ambulatory Visit: Payer: Self-pay

## 2024-08-24 ENCOUNTER — Other Ambulatory Visit (HOSPITAL_COMMUNITY): Payer: Self-pay

## 2024-08-24 NOTE — Telephone Encounter (Signed)
 Pharmacy Patient Advocate Encounter  Received notification from [employer] that Prior Authorization for Nurtec has been APPROVED from 8.21.25 to 2.17.26. Ran test claim, Copay is $RTS RX WAS LAST FILLED ON 8.25.25 . This test claim was processed through [facility]- copay amounts may vary at other pharmacies due to pharmacy/plan contracts, or as the patient moves through the different stages of their insurance plan.   PA #/Case ID/Reference #: [id number] VIA LATENT

## 2024-08-25 ENCOUNTER — Ambulatory Visit: Admitting: Physician Assistant

## 2024-08-26 ENCOUNTER — Other Ambulatory Visit (HOSPITAL_COMMUNITY): Payer: Self-pay

## 2024-08-31 ENCOUNTER — Telehealth: Admitting: Nurse Practitioner

## 2024-09-01 ENCOUNTER — Ambulatory Visit (INDEPENDENT_AMBULATORY_CARE_PROVIDER_SITE_OTHER): Admitting: Physician Assistant

## 2024-09-01 ENCOUNTER — Other Ambulatory Visit (INDEPENDENT_AMBULATORY_CARE_PROVIDER_SITE_OTHER): Payer: Self-pay

## 2024-09-01 ENCOUNTER — Encounter: Payer: Self-pay | Admitting: Physician Assistant

## 2024-09-01 DIAGNOSIS — M25561 Pain in right knee: Secondary | ICD-10-CM

## 2024-09-01 DIAGNOSIS — G8929 Other chronic pain: Secondary | ICD-10-CM

## 2024-09-01 DIAGNOSIS — M25562 Pain in left knee: Secondary | ICD-10-CM | POA: Diagnosis not present

## 2024-09-01 MED ORDER — HYALURONAN 88 MG/4ML IX SOSY
88.0000 mg | PREFILLED_SYRINGE | INTRA_ARTICULAR | Status: AC | PRN
Start: 2024-09-01 — End: 2024-09-01
  Administered 2024-09-01: 88 mg via INTRA_ARTICULAR

## 2024-09-01 MED ORDER — HYALURONAN 88 MG/4ML IX SOSY
88.0000 mg | PREFILLED_SYRINGE | INTRA_ARTICULAR | Status: AC | PRN
  Administered 2024-09-01: 88 mg via INTRA_ARTICULAR

## 2024-09-01 NOTE — Progress Notes (Signed)
 Office Visit Note   Patient: Audrey [name]           Date of Birth: 24-Aug-1986           MRN: [id number] Visit Date: 09/01/2024              Requested by: Oris Camie BRAVO, NP [address] [place],  [place] [address] PCP: Oris Camie BRAVO, NP  Chief Complaint  Patient presents with   Right Knee - Pain   Left Knee - Pain      HPI: Patient is a pleasant 38 year old woman formally seen by Dr. Anderson.  She comes in periodically for Monovisc knee injections.  She works as a Engineer, site.  Assessment & Plan: Visit Diagnoses:  1. Chronic pain of both knees     Plan: Continue current conservative care I do not see any progression on her knee x-rays and certainly as a young patient would like to avoid any knee replacements for as long as possible May follow-up as needed  Follow-Up Instructions: No follow-ups on file.   Ortho Exam  Patient is alert, oriented, no adenopathy, well-dressed, normal affect, normal respiratory effort. Bilateral knees no effusion she is neurovascularly intact compartments are soft and nontender no erythema    Imaging: No results found. No images are attached to the encounter.  Labs: Lab Results  Component Value Date   HGBA1C 4.9 05/25/2024   HGBA1C 5.3 02/19/2024   HGBA1C 4.9 09/18/2023   ESRSEDRATE 25 (H) 11/17/2022   CRP 1.2 (H) 11/17/2022   REPTSTATUS 05/15/2014 FINAL 05/14/2014   CULT NO GROWTH Performed at [facility] 05/14/2014     Lab Results  Component Value Date   ALBUMIN 3.8 (L) 09/18/2023   ALBUMIN 3.8 07/08/2023   ALBUMIN 3.9 01/13/2023    No results found for: MG Lab Results  Component Value Date   VD25OH 16.0 (L) 02/19/2024   VD25OH 20.7 (L) 12/15/2023   VD25OH 28.3 (L) 09/18/2023    No results found for: PREALBUMIN    Latest Ref Rng & Units 05/31/2024    2:20 PM 05/25/2024    9:34 AM 02/19/2024   11:32 AM  CBC EXTENDED  WBC 4.0 - 10.5 K/uL 11.1  12.2  11.9   RBC 3.87 - 5.11 MIL/uL 4.13   4.39  4.76   Hemoglobin 12.0 - 15.0 g/dL 88.5  87.9  87.0   HCT 36.0 - 46.0 % 36.9  39.2  41.6   Platelets 150 - 400 K/uL 344  375  426   NEUT# 1.4 - 7.0 x10E3/uL  7.9  7.5   Lymph# 0.7 - 3.1 x10E3/uL  2.9  3.2      There is no height or weight on file to calculate BMI.  Orders:  Orders Placed This Encounter  Procedures   XR KNEE 3 VIEW RIGHT   XR KNEE 3 VIEW LEFT   No orders of the defined types were placed in this encounter.    Procedures: Large Joint Inj: bilateral knee on 09/01/2024 4:05 PM Indications: pain and diagnostic evaluation Details: 22 G 1.5 in needle, anteromedial approach  Arthrogram: No  Medications (Right): 88 mg Hyaluronan 88 MG/4ML Medications (Left): 88 mg Hyaluronan 88 MG/4ML Outcome: tolerated well, no immediate complications Procedure, treatment alternatives, risks and benefits explained, specific risks discussed. Consent was given by the patient.     Clinical Data: No additional findings.  ROS:  All other systems negative, except as noted in the HPI. Review of Systems  Objective: Vital Signs: There were no vitals taken for this visit.  Specialty Comments:  No specialty comments available.  PMFS History: Patient Active Problem List   Diagnosis Date Noted   Pelvic adhesions 06/16/2024   Rectal bleeding 06/16/2024   Primary insomnia 12/11/2023   Moderate persistent asthma without complication 09/25/2023   Encounter for annual physical exam 09/18/2023   Acne vulgaris 03/24/2023   Rash and nonspecific skin eruption 01/17/2023   Menorrhagia with irregular cycle 01/17/2023   Insulin  resistance 01/17/2023   Iron  deficiency anemia due to chronic blood loss 11/17/2022   Osteoarthritis of knees, bilateral 07/13/2020   Class 3 severe obesity with body mass index (BMI) of 40.0 to 44.9 in adult 01/27/2019   Bilateral knee pain 01/27/2019   Breast lump on left side at 3 o'clock position 01/27/2019   Reflux gastritis 01/26/2019   Migraine  07/12/2014   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   Past Medical History:  Diagnosis Date   Anemia    r/t uterine bleeding   Arthritis    Asthma    Atypical chest pain 10/03/2020   Common migraine with intractable migraine 01/23/2017   Dandruff 03/24/2023   Fetal demise 05/19/2013   GERD (gastroesophageal reflux disease)    Gestational diabetes    Hx, A1C 4.9 04/2024   Headache    migraines   Hemorrhage after delivery of fetus 08/12/2006   Morbidly obese (HCC) 01/23/2017   Motion sickness 08/27/2023   Preeclampsia    Pregnancy induced hypertension    S/P cesarean section 05/20/2013   S/P cesarean section 11/18/2014   Sleep apnea 2025    Family History  Adopted: Yes  Problem Relation Age of Onset   Diabetes Mother    Asthma Other    Hyperlipidemia Other    Obesity Other    Diabetes Maternal Grandmother    Hypertension Maternal Grandmother    Diabetes Paternal Grandmother    Hypertension Paternal Grandmother     Past Surgical History:  Procedure Laterality Date   CESAREAN SECTION  07/2006   CESAREAN SECTION N/A 05/19/2013   Procedure: CESAREAN SECTION;  Surgeon: Ezzie Marshall, MD;  Location: [facility];  Service: Obstetrics;  Laterality: N/A;   CESAREAN SECTION WITH BILATERAL TUBAL LIGATION Bilateral 11/17/2014   Procedure: CESAREAN SECTION WITH BILATERAL TUBAL LIGATION;  Surgeon: Ezzie Buba, MD;  Location: [facility];  Service: Obstetrics;  Laterality: Bilateral;   LAPAROSCOPIC LYSIS OF ADHESIONS  06/07/2024   Procedure: LYSIS, ADHESIONS, LAPAROSCOPIC;  Surgeon: Cleotilde Ronal RAMAN, MD;  Location: [facility];  Service: Gynecology;;   TOENAIL EXCISION     WISDOM TOOTH EXTRACTION     Social History   Occupational History    Employer: [employer]  Tobacco Use   Smoking status: Never   Smokeless tobacco: Never  Vaping Use   Vaping status: Never Used  Substance and Sexual Activity   Alcohol use: No   Drug use: Never   Sexual activity: Not Currently    Birth  control/protection: None

## 2024-09-17 ENCOUNTER — Other Ambulatory Visit (HOSPITAL_COMMUNITY): Payer: Self-pay

## 2024-09-19 ENCOUNTER — Other Ambulatory Visit: Payer: Self-pay

## 2024-09-21 ENCOUNTER — Encounter (HOSPITAL_BASED_OUTPATIENT_CLINIC_OR_DEPARTMENT_OTHER): Payer: Self-pay | Admitting: Obstetrics & Gynecology

## 2024-09-23 ENCOUNTER — Encounter: Payer: Self-pay | Admitting: Nurse Practitioner

## 2024-09-23 ENCOUNTER — Other Ambulatory Visit (HOSPITAL_COMMUNITY): Payer: Self-pay

## 2024-09-23 ENCOUNTER — Ambulatory Visit (INDEPENDENT_AMBULATORY_CARE_PROVIDER_SITE_OTHER): Payer: 59 | Admitting: Nurse Practitioner

## 2024-09-23 VITALS — BP 124/80 | HR 74 | Ht 68.25 in | Wt 280.4 lb

## 2024-09-23 DIAGNOSIS — E559 Vitamin D deficiency, unspecified: Secondary | ICD-10-CM

## 2024-09-23 DIAGNOSIS — F418 Other specified anxiety disorders: Secondary | ICD-10-CM | POA: Insufficient documentation

## 2024-09-23 DIAGNOSIS — R5383 Other fatigue: Secondary | ICD-10-CM

## 2024-09-23 DIAGNOSIS — J3489 Other specified disorders of nose and nasal sinuses: Secondary | ICD-10-CM | POA: Insufficient documentation

## 2024-09-23 DIAGNOSIS — E66813 Obesity, class 3: Secondary | ICD-10-CM

## 2024-09-23 DIAGNOSIS — E88819 Insulin resistance, unspecified: Secondary | ICD-10-CM

## 2024-09-23 DIAGNOSIS — R109 Unspecified abdominal pain: Secondary | ICD-10-CM | POA: Diagnosis not present

## 2024-09-23 DIAGNOSIS — J4540 Moderate persistent asthma, uncomplicated: Secondary | ICD-10-CM

## 2024-09-23 DIAGNOSIS — M170 Bilateral primary osteoarthritis of knee: Secondary | ICD-10-CM

## 2024-09-23 DIAGNOSIS — N736 Female pelvic peritoneal adhesions (postinfective): Secondary | ICD-10-CM

## 2024-09-23 DIAGNOSIS — Z0000 Encounter for general adult medical examination without abnormal findings: Secondary | ICD-10-CM | POA: Diagnosis not present

## 2024-09-23 DIAGNOSIS — Z6841 Body Mass Index (BMI) 40.0 and over, adult: Secondary | ICD-10-CM | POA: Diagnosis not present

## 2024-09-23 DIAGNOSIS — G4733 Obstructive sleep apnea (adult) (pediatric): Secondary | ICD-10-CM

## 2024-09-23 DIAGNOSIS — K2960 Other gastritis without bleeding: Secondary | ICD-10-CM | POA: Diagnosis not present

## 2024-09-23 DIAGNOSIS — D649 Anemia, unspecified: Secondary | ICD-10-CM

## 2024-09-23 DIAGNOSIS — G8929 Other chronic pain: Secondary | ICD-10-CM

## 2024-09-23 MED ORDER — AZELASTINE HCL 0.1 % NA SOLN
2.0000 | Freq: Two times a day (BID) | NASAL | 12 refills | Status: AC
  Filled 2024-09-23: qty 30, 25d supply, fill #0

## 2024-09-23 MED ORDER — MONTELUKAST SODIUM 10 MG PO TABS
10.0000 mg | ORAL_TABLET | Freq: Every day | ORAL | 3 refills | Status: AC
  Filled 2024-09-23: qty 90, 90d supply, fill #0

## 2024-09-23 MED ORDER — DICYCLOMINE HCL 10 MG PO CAPS
ORAL_CAPSULE | ORAL | 3 refills | Status: AC
  Filled 2024-09-23: qty 90, 23d supply, fill #0

## 2024-09-23 MED ORDER — TRAMADOL HCL 50 MG PO TABS
50.0000 mg | ORAL_TABLET | Freq: Three times a day (TID) | ORAL | 0 refills | Status: DC | PRN
  Filled 2024-09-23: qty 20, 7d supply, fill #0

## 2024-09-23 MED ORDER — PANTOPRAZOLE SODIUM 40 MG PO TBEC
40.0000 mg | DELAYED_RELEASE_TABLET | Freq: Every day | ORAL | 3 refills | Status: AC
  Filled 2024-09-23: qty 90, 90d supply, fill #0

## 2024-09-23 NOTE — Assessment & Plan Note (Signed)
 Persistent GERD symptoms despite current treatment with Protonix  and famotidine , including postprandial discomfort and a bad taste in the mouth. - Recommend contact [facility] to schedule appt. Previous referral placed. If new referral needed, please contact me - Continue with current medication management and diet options for reflux

## 2024-09-23 NOTE — Progress Notes (Signed)
 Audrey Doing, DNP, AGNP-c [facility] [address] [place], [place] [address] Main Office [phone] VISIT TYPE: CPE on 09/23/2024 Today's Vitals   09/23/24 0812  BP: 124/80  Pulse: 74  Weight: 280 lb 6.4 oz (127.2 kg)  Height: 5' 8.25 (1.734 m)   Body mass index is 42.32 kg/m. BP 124/80   Pulse 74   Ht 5' 8.25 (1.734 m)   Wt 280 lb 6.4 oz (127.2 kg)   BMI 42.32 kg/m   Subjective:    Patient ID: Audrey [name] Agent, female    DOB: May 17, 1986, 38 y.o.   MRN: [id number]  HPI: History of Present Illness Audrey [name] is a 38 year old female who presents for her annual exam.   She experiences fatigue and is uncertain if it is due to low energy or stress. She has started taking magnesium  gummies once daily for the past few days. She suspects low iron  levels might be contributing to her symptoms, as she is experiencing leg cramps, which she associates with low iron  levels from past experiences.  She reports blurry vision, especially in the mornings, and has not visited an eye doctor in two years. She sometimes needs to move objects further away to see them clearly.   She frequently experiences mucus in her throat and has been taking Xyzal  for allergies, though she is unsure of its effectiveness. She recalls that Singulair  (montelukast ) helped in the past. She notes increased mucus production after eating and a bad taste in her mouth at times, despite taking Protonix  40 mg and famotidine  at night. She has not seen a GI doctor recently. She does remember a previous referral to [facility] and will call to schedule a visit.   She has a history of knee issues and has received gel injections in her knee three times, which she finds helpful. She feels she is too young for a knee replacement.  She uses an albuterol  inhaler for shortness of breath, which provides relief. She also uses azelastine  nasal spray. She uses Ajovy  and Nurtec for migraines.  She has a history of  sleep apnea confirmed by a sleep study. She is not currently using a CPAP machine per recommendations with pulmonology. Weight management is the primary recommendation, but she finds this very difficult due to insulin  resistance.   She is a single parent with an eighteen-year-old and a child who will be ten in November. She feels overwhelmed at times but has a support system of chosen family and friends. She does not have close family support. She has difficulties at times with the stressors of single parenting and with her job. She is open to speaking with a counselor.   Pertinent items are noted in HPI.  Most Recent Depression Screen:     09/23/2024    8:10 AM 06/14/2024    3:23 PM 05/25/2024    8:32 AM 03/02/2024   11:13 AM 09/18/2023    8:07 AM  Depression screen PHQ 2/9  Decreased Interest 0 0 0 0 0  Down, Depressed, Hopeless 1 0 0 0 0  PHQ - 2 Score 1 0 0 0 0   Most Recent Anxiety Screen:      No data to display         Most Recent Fall Screen:    09/23/2024    8:10 AM 05/25/2024    8:32 AM 05/03/2024    8:43 AM 02/04/2024    9:42 AM 09/18/2023    8:06 AM  Fall  Risk   Falls in the past year? 0 0 0 0 0  Number falls in past yr: 0 0   0  Injury with Fall? 0 0   0  Risk for fall due to : No Fall Risks No Fall Risks   No Fall Risks  Follow up Falls evaluation completed Falls evaluation completed   Falls evaluation completed    Past medical history, surgical history, medications, allergies, family history and social history reviewed with patient today and changes made to appropriate areas of the chart.  Past Medical History:  Past Medical History:  Diagnosis Date   Anemia    r/t uterine bleeding   Arthritis    Asthma    Atypical chest pain 10/03/2020   Bilateral knee pain 01/27/2019   Breast lump on left side at 3 o'clock position 01/27/2019   Common migraine with intractable migraine 01/23/2017   Dandruff 03/24/2023   Fetal demise 05/19/2013   GERD (gastroesophageal  reflux disease)    Gestational diabetes    Hx, A1C 4.9 04/2024   Headache    migraines   Hemorrhage after delivery of fetus 08/12/2006   Morbidly obese (HCC) 01/23/2017   Motion sickness 08/27/2023   Preeclampsia    Pregnancy induced hypertension    Rectal bleeding 06/16/2024   S/P cesarean section 05/20/2013   S/P cesarean section 11/18/2014   Sleep apnea 2025   Medications:  Current Outpatient Medications on File Prior to Visit  Medication Sig   acetaminophen  (TYLENOL ) 325 MG tablet Take 650 mg by mouth every 6 (six) hours as needed.   albuterol  (PROVENTIL ) (2.5 MG/3ML) 0.083% nebulizer solution Take 3 mLs (2.5 mg total) by nebulization every 6 (six) hours as needed for wheezing or shortness of breath.   albuterol  (VENTOLIN  HFA) 108 (90 Base) MCG/ACT inhaler Inhale 1-2 puffs into the lungs every 6 (six) hours as needed for wheezing or shortness of breath.   aspirin-acetaminophen -caffeine (EXCEDRIN MIGRAINE) 250-250-65 MG tablet Take 1 tablet by mouth every 6 (six) hours as needed for headache.   budesonide  (PULMICORT ) 0.25 MG/2ML nebulizer solution Take 2 mLs (0.25 mg total) by nebulization daily. For bronchitis/asthma exacerbation.   budesonide -formoterol  (SYMBICORT ) 160-4.5 MCG/ACT inhaler Inhale 2 puffs into the lungs 2 (two) times daily.   cyclobenzaprine  (FLEXERIL ) 10 MG tablet Take 1 tablet (10 mg total) by mouth 3 (three) times daily as needed for muscle spasms.   famotidine  (PEPCID ) 20 MG tablet Take 1 tablet (20 mg total) by mouth at bedtime.   Ferrous Sulfate  (ONE VITE FERROUS SULFATE ) 300 MG/6.8ML SOLN Take 5 mLs (220mg  total) by mouth daily.   Ferrous Sulfate  5 MG/20ML SOLN Take 5 mLs by mouth daily.   fluticasone  furoate-vilanterol (BREO ELLIPTA ) 200-25 MCG/ACT AEPB Inhale 1 puff into the lungs daily.   Fremanezumab -vfrm (AJOVY ) 225 MG/1.5ML SOAJ Inject 225 mg into the skin every 30 (thirty) days.   levocetirizine (XYZAL ) 5 MG tablet Take 1 tablet (5 mg total) by mouth  every evening.   mometasone  (ELOCON ) 0.1 % cream Apply a thin layer to the affected area twice a day.   norethindrone  (AYGESTIN ) 5 MG tablet Take 1 tablet (5 mg total) by mouth daily.   Rimegepant Sulfate  (NURTEC) 75 MG TBDP Take one tablet by mouth at the first sign of migraine. No more than 1 dose every 24 hours. (Patient taking differently: Take 75 mg by mouth daily. prn)   tranexamic acid  (LYSTEDA ) 650 MG TABS tablet Take 2 tablets (1,300 mg total) by mouth 3 (  three) times daily.   valACYclovir  (VALTREX ) 1000 MG tablet Take 2 tablets (2,000 mg total) by mouth 2 (two) times daily, for one day at the first sign of outbreak.   Vitamin D , Ergocalciferol , (DRISDOL ) 1.25 MG (50000 UNIT) CAPS capsule Take 1 capsule (50,000 Units total) by mouth every 7 (seven) days.   No current facility-administered medications on file prior to visit.   Surgical History:  Past Surgical History:  Procedure Laterality Date   CESAREAN SECTION  07/2006   CESAREAN SECTION N/A 05/19/2013   Procedure: CESAREAN SECTION;  Surgeon: Ezzie Marshall, MD;  Location: [facility];  Service: Obstetrics;  Laterality: N/A;   CESAREAN SECTION WITH BILATERAL TUBAL LIGATION Bilateral 11/17/2014   Procedure: CESAREAN SECTION WITH BILATERAL TUBAL LIGATION;  Surgeon: Ezzie Buba, MD;  Location: [facility];  Service: Obstetrics;  Laterality: Bilateral;   LAPAROSCOPIC LYSIS OF ADHESIONS  06/07/2024   Procedure: LYSIS, ADHESIONS, LAPAROSCOPIC;  Surgeon: Cleotilde Ronal RAMAN, MD;  Location: [facility];  Service: Gynecology;;   TOENAIL EXCISION     WISDOM TOOTH EXTRACTION     Allergies:  Allergies  Allergen Reactions   Sumatriptan Nausea Only    Dizziness    Family History:  Family History  Adopted: Yes  Problem Relation Age of Onset   Diabetes Mother    Asthma Other    Hyperlipidemia Other    Obesity Other    Diabetes Maternal Grandmother    Hypertension Maternal Grandmother    Diabetes Paternal Grandmother    Hypertension Paternal  Grandmother        Objective:    BP 124/80   Pulse 74   Ht 5' 8.25 (1.734 m)   Wt 280 lb 6.4 oz (127.2 kg)   BMI 42.32 kg/m   Wt Readings from Last 3 Encounters:  09/23/24 280 lb 6.4 oz (127.2 kg)  08/17/24 274 lb (124.3 kg)  07/12/24 277 lb 6.4 oz (125.8 kg)    Physical Exam Vitals and nursing note reviewed.  Constitutional:      General: She is not in acute distress.    Appearance: Normal appearance. She is obese.  HENT:     Head: Normocephalic and atraumatic.     Right Ear: Hearing, tympanic membrane, ear canal and external ear normal.     Left Ear: Hearing, tympanic membrane, ear canal and external ear normal.     Nose: Nose normal.     Right Sinus: No maxillary sinus tenderness or frontal sinus tenderness.     Left Sinus: No maxillary sinus tenderness or frontal sinus tenderness.     Mouth/Throat:     Lips: Pink.     Mouth: Mucous membranes are moist.     Pharynx: Oropharynx is clear.  Eyes:     General: Lids are normal. Vision grossly intact.     Extraocular Movements: Extraocular movements intact.     Conjunctiva/sclera: Conjunctivae normal.     Pupils: Pupils are equal, round, and reactive to light.     Funduscopic exam:    Right eye: Red reflex present.        Left eye: Red reflex present.    Visual Fields: Right eye visual fields normal and left eye visual fields normal.  Neck:     Thyroid : Thyromegaly present.     Vascular: No carotid bruit.  Cardiovascular:     Rate and Rhythm: Normal rate and regular rhythm.     Chest Wall: PMI is not displaced.     Pulses: Normal pulses.  Dorsalis pedis pulses are 2+ on the right side and 2+ on the left side.       Posterior tibial pulses are 2+ on the right side and 2+ on the left side.     Heart sounds: Normal heart sounds. No murmur heard. Pulmonary:     Effort: Pulmonary effort is normal. No respiratory distress.     Breath sounds: Normal breath sounds.  Abdominal:     General: Abdomen is flat. Bowel  sounds are normal. There is no distension.     Palpations: Abdomen is soft. There is no hepatomegaly, splenomegaly or mass.     Tenderness: There is no abdominal tenderness. There is no right CVA tenderness, left CVA tenderness, guarding or rebound.  Musculoskeletal:        General: Normal range of motion.     Cervical back: Full passive range of motion without pain, normal range of motion and neck supple. No tenderness.     Right lower leg: No edema.     Left lower leg: No edema.  Feet:     Left foot:     Toenail Condition: Left toenails are normal.  Lymphadenopathy:     Cervical: No cervical adenopathy.     Upper Body:     Right upper body: No supraclavicular adenopathy.     Left upper body: No supraclavicular adenopathy.  Skin:    General: Skin is warm and dry.     Capillary Refill: Capillary refill takes less than 2 seconds.     Nails: There is no clubbing.  Neurological:     General: No focal deficit present.     Mental Status: She is alert and oriented to person, place, and time.     GCS: GCS eye subscore is 4. GCS verbal subscore is 5. GCS motor subscore is 6.     Sensory: Sensation is intact. No sensory deficit.     Motor: Motor function is intact. No weakness.     Coordination: Coordination is intact. Coordination normal.     Gait: Gait is intact. Gait normal.     Deep Tendon Reflexes: Reflexes are normal and symmetric.  Psychiatric:        Attention and Perception: Attention normal.        Mood and Affect: Mood normal.        Speech: Speech normal.        Behavior: Behavior normal. Behavior is cooperative.        Thought Content: Thought content normal.        Cognition and Memory: Cognition and memory normal.        Judgment: Judgment normal.      Results for orders placed or performed during the hospital encounter of 06/07/24  Pregnancy, urine POC   Collection Time: 06/07/24  8:36 AM  Result Value Ref Range   Preg Test, Ur NEGATIVE NEGATIVE        Assessment & Plan:   Problem List Items Addressed This Visit     Osteoarthritis of knees, bilateral   Knee osteoarthritis managed with gel injections, administered three times. No current need for knee replacement due to age.       Relevant Medications   traMADol  (ULTRAM ) 50 MG tablet   Insulin  resistance   Insulin  resistance complicating weight loss efforts. Previous use of Wegovy  was discontinued due to insurance coverage issues. - Investigate insurance coverage for weight loss medications like Wegovy  or Mounjaro.      Relevant Orders   CBC  with Differential/Platelet   CMP14+EGFR   Lipid panel   TSH   Hemoglobin A1c   Vitamin D , 25-hydroxy   Iron , TIBC and Ferritin Panel   Vitamin B12   Reflux gastritis   Persistent GERD symptoms despite current treatment with Protonix  and famotidine , including postprandial discomfort and a bad taste in the mouth. - Recommend contact [facility] to schedule appt. Previous referral placed. If new referral needed, please contact me - Continue with current medication management and diet options for reflux      Relevant Medications   pantoprazole  (PROTONIX ) 40 MG tablet   Other Relevant Orders   CBC with Differential/Platelet   CMP14+EGFR   Lipid panel   TSH   Hemoglobin A1c   Vitamin D , 25-hydroxy   Iron , TIBC and Ferritin Panel   Vitamin B12   Encounter for annual physical exam - Primary   CPE completed today. Review of HM activities and recommendations discussed and provided on AVS. Anticipatory guidance, diet, and exercise recommendations provided. Medications, allergies, and hx reviewed and updated as necessary. Orders placed as listed below.  Plan: - Labs ordered. Will make changes as necessary based on results.  - I will review these results and send recommendations via MyChart or a telephone call.  - F/U with CPE in 1 year or sooner for acute/chronic health needs as directed.  4      Pelvic adhesions   Pending hysterectomy  due to pelvic scar tissue and menorrhagia. Referral to [facility] for surgery has been delayed, pending review of notes by the provider. - Follow up with [facility] regarding the referral status for hysterectomy.      Situational anxiety   Experiencing stress and fatigue related to single parenting and work responsibilities. Open to counseling to manage stress and emotional well-being. - Send referral for counseling.      Relevant Orders   Ambulatory referral to Psychology   Chronic bilateral low back pain without sciatica   Relevant Medications   traMADol  (ULTRAM ) 50 MG tablet   Abdominal cramping   Relevant Medications   dicyclomine  (BENTYL ) 10 MG capsule   Fatigue   Fatigue possibly related to low iron  levels, as indicated by the recurrence of leg cramps. Also consider OSA, anxiety, B12 or Vitamin D  deficiency given history. Labs pending. Recently restarted magnesium .  - Advise taking magnesium  at bedtime to aid sleep.       OSA (obstructive sleep apnea)   Mild obstructive sleep apnea diagnosed previously. Current management does not include CPAP as per pulmonologist's advice. Weight loss recommended to potentially improve symptoms. - Investigate insurance coverage for weight loss medications like Wegovy  or Mounjaro.       Posterior rhinorrhea   Chronic throat mucus possibly related to allergic rhinitis. Current treatment with Xyzal  not providing relief. - Discontinue Xyzal . - Start montelukast .      Relevant Medications   montelukast  (SINGULAIR ) 10 MG tablet   azelastine  (ASTELIN ) 0.1 % nasal spray   Moderate persistent asthma without complication   Relevant Medications   montelukast  (SINGULAIR ) 10 MG tablet   Class 3 severe obesity with body mass index (BMI) of 40.0 to 44.9 in adult   Relevant Orders   CBC with Differential/Platelet   CMP14+EGFR   Lipid panel   TSH   Hemoglobin A1c   Vitamin D , 25-hydroxy   Iron , TIBC and Ferritin Panel   Vitamin B12   Other Visit  Diagnoses       Anemia, unspecified type  Relevant Orders   CBC with Differential/Platelet   CMP14+EGFR   Lipid panel   TSH   Hemoglobin A1c   Vitamin D , 25-hydroxy   Iron , TIBC and Ferritin Panel   Vitamin B12     Vitamin D  deficiency       Relevant Orders   CBC with Differential/Platelet   CMP14+EGFR   Lipid panel   TSH   Hemoglobin A1c   Vitamin D , 25-hydroxy   Iron , TIBC and Ferritin Panel   Vitamin B12       Assessment and Plan Assessment & Plan       Follow up plan: Return in about 1 year (around 09/23/2025) for CPE.  NEXT PREVENTATIVE PHYSICAL DUE IN 1 YEAR.  PATIENT COUNSELING PROVIDED FOR ALL ADULT PATIENTS: A well balanced diet low in saturated fats, cholesterol, and moderation in carbohydrates.  This can be as simple as monitoring portion sizes and cutting back on sugary beverages such as soda and juice to start with.    Daily water consumption of at least 64 ounces.  Physical activity at least 180 minutes per week.  If just starting out, start 10 minutes a day and work your way up.   This can be as simple as taking the stairs instead of the elevator and walking 2-3 laps around the office  purposefully every day.   STD protection, partner selection, and regular testing if high risk.  Limited consumption of alcoholic beverages if alcohol is consumed. For men, I recommend no more than 14 alcoholic beverages per week, spread out throughout the week (max 2 per day). Avoid binge drinking or consuming large quantities of alcohol in one setting.  Please let me know if you feel you may need help with reduction or quitting alcohol consumption.   Avoidance of nicotine, if used. Please let me know if you feel you may need help with reduction or quitting nicotine use.   Daily mental health attention. This can be in the form of 5 minute daily meditation, prayer, journaling, yoga, reflection, etc.  Purposeful attention to your emotions and mental state  can significantly improve your overall wellbeing  and  Health.  Please know that I am here to help you with all of your health care goals and am happy to work with you to find a solution that works best for you.  The greatest advice I have received with any changes in life are to take it one step at a time, that even means if all you can focus on is the next 60 seconds, then do that and celebrate your victories.  With any changes in life, you will have set backs, and that is OK. The important thing to remember is, if you have a set back, it is not a failure, it is an opportunity to try again! Screening Testing Mammogram Every 1 -2 years based on history and risk factors Starting at age 27 Pap Smear Ages 21-39 every 3 years Ages 4-65 every 5 years with HPV testing More frequent testing may be required based on results and history Colon Cancer Screening Every 1-10 years based on test performed, risk factors, and history Starting at age 87 Bone Density Screening Every 2-10 years based on history Starting at age 86 for women Recommendations for men differ based on medication usage, history, and risk factors AAA Screening One time ultrasound Men 78-5 years old who have every smoked Lung Cancer Screening Low Dose Lung CT every 12 months Age 106-80 years with a  30 pack-year smoking history who still smoke or who have quit within the last 15 years   Screening Labs Routine  Labs: Complete Blood Count (CBC), Complete Metabolic Panel (CMP), Cholesterol (Lipid Panel) Every 6-12 months based on history and medications May be recommended more frequently based on current conditions or previous results Hemoglobin A1c Lab Every 3-12 months based on history and previous results Starting at age 4 or earlier with diagnosis of diabetes, high cholesterol, BMI >26, and/or risk factors Frequent monitoring for patients with diabetes to ensure blood sugar control Thyroid  Panel (TSH) Every 6 months based  on history, symptoms, and risk factors May be repeated more often if on medication HIV One time testing for all patients 29 and older May be repeated more frequently for patients with increased risk factors or exposure Hepatitis C One time testing for all patients 14 and older May be repeated more frequently for patients with increased risk factors or exposure Gonorrhea, Chlamydia Every 12 months for all sexually active persons 13-24 years Additional monitoring may be recommended for those who are considered high risk or who have symptoms Every 12 months for any woman on birth control, regardless of sexual activity PSA Men 59-68 years old with risk factors Additional screening may be recommended from age 65-69 based on risk factors, symptoms, and history  Vaccine Recommendations Tetanus Booster All adults every 10 years Flu Vaccine All patients 6 months and older every year COVID Vaccine All patients 12 years and older Initial dosing with booster May recommend additional booster based on age and health history HPV Vaccine 2 doses all patients age 52-26 Dosing may be considered for patients over 26 Shingles Vaccine (Shingrix) 2 doses all adults 55 years and older Pneumonia (Pneumovax 23) All adults 65 years and older May recommend earlier dosing based on health history One year apart from Prevnar 13 Pneumonia (Prevnar 56) All adults 65 years and older Dosed 1 year after Pneumovax 23 Pneumonia (Prevnar 20) One time alternative to the two dosing of 13 and 23 For all adults with initial dose of 23, 20 is recommended 1 year later For all adults with initial dose of 13, 23 is still recommended as second option 1 year later

## 2024-09-23 NOTE — Assessment & Plan Note (Signed)
 CPE completed today. Review of HM activities and recommendations discussed and provided on AVS. Anticipatory guidance, diet, and exercise recommendations provided. Medications, allergies, and hx reviewed and updated as necessary. Orders placed as listed below.  Plan: - Labs ordered. Will make changes as necessary based on results.  - I will review these results and send recommendations via MyChart or a telephone call.  - F/U with CPE in 1 year or sooner for acute/chronic health needs as directed.  4

## 2024-09-23 NOTE — Assessment & Plan Note (Signed)
 Chronic throat mucus possibly related to allergic rhinitis. Current treatment with Xyzal  not providing relief. - Discontinue Xyzal . - Start montelukast .

## 2024-09-23 NOTE — Assessment & Plan Note (Signed)
 Experiencing stress and fatigue related to single parenting and work responsibilities. Open to counseling to manage stress and emotional well-being. - Send referral for counseling.

## 2024-09-23 NOTE — Assessment & Plan Note (Signed)
 Mild obstructive sleep apnea diagnosed previously. Current management does not include CPAP as per pulmonologist's advice. Weight loss recommended to potentially improve symptoms. - Investigate insurance coverage for weight loss medications like Wegovy  or Mounjaro.

## 2024-09-23 NOTE — Assessment & Plan Note (Signed)
 Knee osteoarthritis managed with gel injections, administered three times. No current need for knee replacement due to age.

## 2024-09-23 NOTE — Assessment & Plan Note (Signed)
 Pending hysterectomy due to pelvic scar tissue and menorrhagia. Referral to [facility] for surgery has been delayed, pending review of notes by the provider. - Follow up with [facility] regarding the referral status for hysterectomy.

## 2024-09-23 NOTE — Patient Instructions (Signed)
 VISIT SUMMARY:  Today, we discussed your symptoms of fatigue, leg cramps, blurry vision, mucus in your throat, reflux, knee issues, shortness of breath, and migraines. We also reviewed your history of sleep apnea, insulin  resistance, obesity, and chronic musculoskeletal pain. We addressed your concerns and updated your treatment plan accordingly.  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 We will check your iron  levels with a lab test and recommend taking magnesium  at bedtime to help with sleep.  -GASTROESOPHAGEAL REFLUX DISEASE (GERD): GERD is a condition where stomach acid frequently flows back into the tube connecting your mouth and stomach. Despite your current treatment, you are still experiencing symptoms. We will refer you to a gastrointestinal specialist for further evaluation, which may include an endoscopy.  -ADJUSTMENT DISORDER WITH MIXED ANXIETY AND DEPRESSED MOOD: This condition involves stress and emotional challenges that can affect your daily life. We will refer you to counseling to help manage your stress and emotional well-being.  -OBSTRUCTIVE SLEEP APNEA: This is a sleep disorder where breathing repeatedly stops and starts. Although you have mild sleep apnea and are not using a CPAP machine, we recommend looking into weight loss medications like Wegovy  or Mounjaro, pending insurance coverage, to potentially improve your symptoms.  -INSULIN  RESISTANCE AND OBESITY: Insulin  resistance makes it difficult for your body to use insulin  effectively, complicating weight loss. We will investigate insurance coverage for weight loss medications like Wegovy  or Mounjaro to assist with your weight loss efforts.  -KNEE OSTEOARTHRITIS: This is a condition where the cartilage in your knee joint wears down over time. You have been managing it with gel injections and do not currently need a knee replacement.  -CHRONIC  MUSCULOSKELETAL PAIN WITH MUSCLE SPASMS: This refers to ongoing pain in your muscles and bones, sometimes accompanied by muscle spasms. You can continue to use Flexeril  and tramadol  as needed for flare-ups.  -MIGRAINE: Migraines are severe headaches often accompanied by nausea and sensitivity to light. Your current treatment includes Ajovy  and Nurtec, and we have sent recent prescriptions for these medications.  -ASTHMA: Asthma is a condition where your airways narrow and swell, making it difficult to breathe. You are managing it with albuterol  and azelastine  nasal spray.  -ALLERGIC RHINITIS WITH CHRONIC THROAT MUCUS: This condition involves an allergic reaction that causes mucus buildup in your throat. Since Xyzal  is not providing relief, we will discontinue it and start you on montelukast .  -SCAR TISSUE, PELVIC/GYNECOLOGIC, POST-SURGICAL AND MENORRHAGIA: You have pelvic scar tissue and heavy menstrual bleeding, and you are awaiting a hysterectomy. We will follow up with [facility] regarding the status of your referral for surgery.  INSTRUCTIONS:  Please follow up with the lab for your iron  level test. We will also send referrals for a gastrointestinal specialist and counseling. Additionally, we will investigate insurance coverage for weight loss medications. Follow up with [facility] regarding your hysterectomy referral.

## 2024-09-23 NOTE — Assessment & Plan Note (Signed)
 Fatigue possibly related to low iron  levels, as indicated by the recurrence of leg cramps. Also consider OSA, anxiety, B12 or Vitamin D  deficiency given history. Labs pending. Recently restarted magnesium .  - Advise taking magnesium  at bedtime to aid sleep.

## 2024-09-23 NOTE — Assessment & Plan Note (Signed)
 Insulin  resistance complicating weight loss efforts. Previous use of Wegovy  was discontinued due to insurance coverage issues. - Investigate insurance coverage for weight loss medications like Wegovy  or Mounjaro.

## 2024-09-24 LAB — IRON,TIBC AND FERRITIN PANEL
Ferritin: 18 ng/mL (ref 15–150)
Iron Saturation: 9 % — CL (ref 15–55)
Iron: 32 ug/dL (ref 27–159)
Total Iron Binding Capacity: 350 ug/dL (ref 250–450)
UIBC: 318 ug/dL (ref 131–425)

## 2024-09-24 LAB — LIPID PANEL
Chol/HDL Ratio: 3.2 ratio (ref 0.0–4.4)
Cholesterol, Total: 157 mg/dL (ref 100–199)
HDL: 49 mg/dL (ref >39)
LDL Chol Calc (NIH): 92 mg/dL (ref 0–99)
Triglycerides: 85 mg/dL (ref 0–149)
VLDL Cholesterol Cal: 16 mg/dL (ref 5–40)

## 2024-09-24 LAB — CMP14+EGFR
ALT: 25 IU/L (ref 0–32)
AST: 24 IU/L (ref 0–40)
Albumin: 4.2 g/dL (ref 3.9–4.9)
Alkaline Phosphatase: 90 IU/L (ref 41–116)
BUN/Creatinine Ratio: 15 (ref 9–23)
BUN: 12 mg/dL (ref 6–20)
Bilirubin Total: 0.5 mg/dL (ref 0.0–1.2)
CO2: 20 mmol/L (ref 20–29)
Calcium: 9.1 mg/dL (ref 8.7–10.2)
Chloride: 105 mmol/L (ref 96–106)
Creatinine, Ser: 0.82 mg/dL (ref 0.57–1.00)
Globulin, Total: 3 g/dL (ref 1.5–4.5)
Glucose: 86 mg/dL (ref 70–99)
Potassium: 4.8 mmol/L (ref 3.5–5.2)
Sodium: 139 mmol/L (ref 134–144)
Total Protein: 7.2 g/dL (ref 6.0–8.5)
eGFR: 93 mL/min/1.73 (ref >59)

## 2024-09-24 LAB — CBC WITH DIFFERENTIAL/PLATELET
Basophils Absolute: 0 x10E3/uL (ref 0.0–0.2)
Basos: 0 %
EOS (ABSOLUTE): 0.2 x10E3/uL (ref 0.0–0.4)
Eos: 2 %
Hematocrit: 38.7 % (ref 34.0–46.6)
Hemoglobin: 11.8 g/dL (ref 11.1–15.9)
Immature Grans (Abs): 0.1 x10E3/uL (ref 0.0–0.1)
Immature Granulocytes: 1 %
Lymphocytes Absolute: 2.4 x10E3/uL (ref 0.7–3.1)
Lymphs: 23 %
MCH: 27.1 pg (ref 26.6–33.0)
MCHC: 30.5 g/dL — ABNORMAL LOW (ref 31.5–35.7)
MCV: 89 fL (ref 79–97)
Monocytes Absolute: 0.7 x10E3/uL (ref 0.1–0.9)
Monocytes: 7 %
Neutrophils Absolute: 6.9 x10E3/uL (ref 1.4–7.0)
Neutrophils: 67 %
Platelets: 397 x10E3/uL (ref 150–450)
RBC: 4.35 x10E6/uL (ref 3.77–5.28)
RDW: 14 % (ref 11.7–15.4)
WBC: 10.3 x10E3/uL (ref 3.4–10.8)

## 2024-09-24 LAB — VITAMIN D 25 HYDROXY (VIT D DEFICIENCY, FRACTURES): Vit D, 25-Hydroxy: 21.8 ng/mL — ABNORMAL LOW (ref 30.0–100.0)

## 2024-09-24 LAB — TSH: TSH: 1.48 u[IU]/mL (ref 0.450–4.500)

## 2024-09-24 LAB — VITAMIN B12: Vitamin B-12: 456 pg/mL (ref 232–1245)

## 2024-09-24 LAB — HEMOGLOBIN A1C
Est. average glucose Bld gHb Est-mCnc: 97 mg/dL
Hgb A1c MFr Bld: 5 % (ref 4.8–5.6)

## 2024-09-26 ENCOUNTER — Ambulatory Visit: Payer: Self-pay | Admitting: Nurse Practitioner

## 2024-09-26 DIAGNOSIS — E559 Vitamin D deficiency, unspecified: Secondary | ICD-10-CM

## 2024-09-28 ENCOUNTER — Encounter: Payer: Self-pay | Admitting: Nurse Practitioner

## 2024-09-28 ENCOUNTER — Other Ambulatory Visit (HOSPITAL_COMMUNITY): Payer: Self-pay

## 2024-09-28 MED ORDER — VITAMIN D (ERGOCALCIFEROL) 1.25 MG (50000 UNIT) PO CAPS
ORAL_CAPSULE | ORAL | 3 refills | Status: AC
  Filled 2024-09-28: qty 24, fill #0

## 2024-09-29 ENCOUNTER — Other Ambulatory Visit (HOSPITAL_COMMUNITY): Payer: Self-pay | Admitting: Pharmacy Technician

## 2024-09-30 LAB — INSULIN, RANDOM: INSULIN: 28.1 u[IU]/mL — AB (ref 2.6–24.9)

## 2024-09-30 LAB — SPECIMEN STATUS REPORT

## 2024-10-05 ENCOUNTER — Other Ambulatory Visit (HOSPITAL_COMMUNITY): Payer: Self-pay

## 2024-10-05 ENCOUNTER — Other Ambulatory Visit (INDEPENDENT_AMBULATORY_CARE_PROVIDER_SITE_OTHER): Payer: Self-pay | Admitting: Nurse Practitioner

## 2024-10-05 DIAGNOSIS — F418 Other specified anxiety disorders: Secondary | ICD-10-CM

## 2024-10-05 MED ORDER — ESCITALOPRAM OXALATE 10 MG PO TABS
10.0000 mg | ORAL_TABLET | Freq: Every day | ORAL | 1 refills | Status: AC
  Filled 2024-10-05: qty 30, 30d supply, fill #0

## 2024-10-05 MED ORDER — LORAZEPAM 0.5 MG PO TABS
ORAL_TABLET | ORAL | 1 refills | Status: AC
  Filled 2024-10-05: qty 20, 20d supply, fill #0

## 2024-10-05 NOTE — Progress Notes (Signed)
 Virtual Visit Encounter telephone visit.   I connected with  Audrey [name] on 10/05/24 at  by secure audio telemedicine application. I verified that I am speaking with the correct person using two identifiers.   I introduced myself as a Publishing rights manager with the practice. The limitations of evaluation and management by telemedicine discussed with the patient and the availability of in person appointments. The patient expressed verbal understanding and consent to proceed.  Participating parties in this visit include: Myself and patient  The patient is: Patient Location: Other:  driving I am: Provider Location: Office/Clinic Subjective:    CC and HPI: Audrey [name] is a 38 y.o. year old female presenting for anxiety and depressive symptoms.   Patient contacted provider with concerns for worsening anxiety and panic symptoms. She reports feeling increasingly overwhelmed with being a single parent and the struggles of having to be the responsible party for everything in her children and her life. She is in the process of moving her youngest child to a new school and this increases the stress. She also has stressors with work. She has an appointment scheduled with a psychologist, but this is not until November. She tells me she does not feel that she can make it that long without intervention.   She tells me she was speaking with her best friend yesterday and she suggested she contact her provider to discuss medication options to help.   She reports a decrease in energy, decreased motivation, isolation, and sleeping during free time. She also reports panic attacks with chest pain, shortness of breath, and emotional upheaval without any known or obvious triggers.   She is open to medication, but does not want to be on anything long term.   Past medical history, Surgical history, Family history not pertinant except as noted below, Social history, Allergies, and medications have been entered into  the medical record, reviewed, and corrections made.   Review of Systems:  All review of systems negative except what is listed in the HPI  Objective:    Alert and oriented x 4 Speaking in clear sentences with no shortness of breath. No distress.  Impression and Recommendations:    Problem List Items Addressed This Visit     Situational anxiety - Primary   Relevant Medications   LORazepam (ATIVAN) 0.5 MG tablet   escitalopram (LEXAPRO) 10 MG tablet  We discussed that her current symptoms are consistent with anxiety that has triggered a depressive state and panic attacks. We discussed treatment options including short term and long term therapies and expectations of the medications. We also discussed the option of time off to allow for the medications to work. At this time she is in agreement to start a short term as needed medication to help control the overwhelming symptoms present while also starting a once daily medication to provide stability of her emotional state on a consistent basis. We discussed that medications do not have to be long term. We will trial lorazepam very low dose for as needed use while starting escitalopram once a day for ongoing treatment. She can take 1/2 -1 tablet of the lorazepam up to once a day for the management of anxiety and panic symptoms while the escitalopram is beginning to work in her system. We discussed that this medication may make her sleepy and to use caution. This will be a short term option for treatment. Time away from work to help reduce stressors would be helpful while she begins the medication,  but we discussed the financial concerns with this and at this time she feels this may create increased stress, which is understandable. She will let me know if she needs FMLA paperwork to help with time off should she choose to take this.  We will plan to touch base in about 2 weeks to evaluate how she is [name]. She can contact me sooner if symptoms worsen  or change.   orders and follow up as documented in EMR I discussed the assessment and treatment plan with the patient. The patient was provided an opportunity to ask questions and all were answered. The patient agreed with the plan and demonstrated an understanding of the instructions.   The patient was advised to call back or seek an in-person evaluation if the symptoms worsen or if the condition fails to improve as anticipated.  Follow-Up: in 2 weeks  I provided 13 minutes of non-face-to-face interaction with this non face-to-face encounter including intake, same-day documentation, and chart review.   Audrey CHARLENA Doing, NP , DNP, AGNP-c [facility]

## 2024-10-05 NOTE — Telephone Encounter (Signed)
 I scheduled her with SB for tomorrow increasing anxiety issues

## 2024-10-06 ENCOUNTER — Telehealth (INDEPENDENT_AMBULATORY_CARE_PROVIDER_SITE_OTHER): Admitting: Nurse Practitioner

## 2024-10-06 ENCOUNTER — Encounter: Payer: Self-pay | Admitting: Nurse Practitioner

## 2024-10-06 DIAGNOSIS — F418 Other specified anxiety disorders: Secondary | ICD-10-CM

## 2024-10-06 NOTE — Progress Notes (Signed)
 Visit completed Audrey [name] to accommodate patient needs. Note associated with orders from 10/05/2024.

## 2024-10-07 ENCOUNTER — Telehealth: Payer: Self-pay | Admitting: Pharmacy Technician

## 2024-10-07 ENCOUNTER — Telehealth (HOSPITAL_COMMUNITY): Payer: Self-pay | Admitting: Nurse Practitioner

## 2024-10-07 ENCOUNTER — Telehealth: Payer: Self-pay

## 2024-10-07 DIAGNOSIS — E611 Iron deficiency: Secondary | ICD-10-CM | POA: Insufficient documentation

## 2024-10-07 NOTE — Telephone Encounter (Signed)
 Auth Submission: NO AUTH NEEDED Site of care: Site of care: [facility] Payer: [place] [employer] S[employer]  Medication & CPT/J Code(s) submitted: Venofer  (Iron  Sucrose) J1756 Diagnosis Code:  Route of submission (phone, fax, portal): portal Phone # Fax # Auth type: Buy/Bill PB Units/visits requested: 300mg  x 3 doses Reference number:  Approval from: 10/07/24 to 12/28/24

## 2024-10-07 NOTE — Telephone Encounter (Signed)
 Auth Submission: NO AUTH NEEDED Site of care: Site of care: [facility] Payer: [employer] Medication & CPT/J Code(s) submitted: Venofer  (Iron  Sucrose) J1756 Diagnosis Code: D50.9 Route of submission (phone, fax, portal):  Phone # Fax # Auth type: Buy/Bill PB Units/visits requested: 300mg  x3 doses Reference number:  Approval from: 10/07/24 to 12/28/24

## 2024-10-07 NOTE — Telephone Encounter (Signed)
 Patient referred to infusion pharmacy team for ambulatory infusion of IV iron .  Insurance - [employer] Parts of care - Site of care: [facility] Dx code - E61.1 IV Iron  Therapy - Venofer  300 mg x 3 Infusion appointments - Scheduling team will schedule patient as soon as possible.    Thank you,  Norton Blush, PharmD, BCSCP Pharmacist [facility]

## 2024-10-19 DIAGNOSIS — H52223 Regular astigmatism, bilateral: Secondary | ICD-10-CM | POA: Diagnosis not present

## 2024-10-24 ENCOUNTER — Ambulatory Visit

## 2024-10-24 VITALS — BP 121/82 | HR 74 | Temp 98.4°F | Resp 16 | Ht 68.0 in | Wt 282.2 lb

## 2024-10-24 DIAGNOSIS — D500 Iron deficiency anemia secondary to blood loss (chronic): Secondary | ICD-10-CM

## 2024-10-24 DIAGNOSIS — E611 Iron deficiency: Secondary | ICD-10-CM

## 2024-10-24 MED ORDER — SODIUM CHLORIDE 0.9 % IV SOLN
300.0000 mg | Freq: Once | INTRAVENOUS | Status: AC
  Administered 2024-10-24: 300 mg via INTRAVENOUS
  Filled 2024-10-24: qty 15

## 2024-10-24 NOTE — Progress Notes (Signed)
 Diagnosis: Iron  Deficiency Anemia  Provider:  Praveen Mannam MD  Procedure: IV Infusion  IV Type: Peripheral, IV Location: R Forearm  Venofer  (Iron  Sucrose), Dose: 300 mg  Infusion Start Time: 1420  Infusion Stop Time: 1603  Post Infusion IV Care: Observation period completed  Discharge: Condition: Good, Destination: Home . AVS Declined  Performed by:  Eleanor DELENA Bloch, RN

## 2024-10-31 ENCOUNTER — Encounter: Payer: Self-pay | Admitting: Radiology

## 2024-10-31 ENCOUNTER — Other Ambulatory Visit (HOSPITAL_COMMUNITY): Payer: Self-pay

## 2024-10-31 ENCOUNTER — Ambulatory Visit (INDEPENDENT_AMBULATORY_CARE_PROVIDER_SITE_OTHER)

## 2024-10-31 VITALS — BP 124/82 | HR 66 | Temp 98.2°F | Resp 18 | Ht 68.0 in | Wt 283.8 lb

## 2024-10-31 DIAGNOSIS — D500 Iron deficiency anemia secondary to blood loss (chronic): Secondary | ICD-10-CM

## 2024-10-31 DIAGNOSIS — E611 Iron deficiency: Secondary | ICD-10-CM

## 2024-10-31 DIAGNOSIS — N920 Excessive and frequent menstruation with regular cycle: Secondary | ICD-10-CM | POA: Diagnosis not present

## 2024-10-31 DIAGNOSIS — N736 Female pelvic peritoneal adhesions (postinfective): Secondary | ICD-10-CM | POA: Diagnosis not present

## 2024-10-31 MED ORDER — NORETHINDRONE ACETATE 5 MG PO TABS
5.0000 mg | ORAL_TABLET | Freq: Two times a day (BID) | ORAL | 11 refills | Status: AC
  Filled 2024-10-31: qty 90, 30d supply, fill #0

## 2024-10-31 MED ORDER — SODIUM CHLORIDE 0.9 % IV SOLN
300.0000 mg | Freq: Once | INTRAVENOUS | Status: AC
  Administered 2024-10-31: 300 mg via INTRAVENOUS
  Filled 2024-10-31: qty 15

## 2024-10-31 NOTE — Progress Notes (Signed)
 Diagnosis: Iron  Deficiency Anemia  Provider:  Praveen Mannam MD  Procedure: IV Infusion  IV Type: Peripheral, IV Location: R Forearm  Venofer  (Iron  Sucrose), Dose: 300 mg  Infusion Start Time: 1416  Infusion Stop Time: 1553  Post Infusion IV Care: Patient declined observation and Peripheral IV Discontinued  Discharge: Condition: Good, Destination: Home . AVS Declined  Performed by:  Leita FORBES Miles, LPN

## 2024-11-01 ENCOUNTER — Ambulatory Visit: Admitting: Clinical

## 2024-11-01 DIAGNOSIS — F4323 Adjustment disorder with mixed anxiety and depressed mood: Secondary | ICD-10-CM | POA: Diagnosis not present

## 2024-11-01 NOTE — Progress Notes (Signed)
 [facility] Counselor Adult Comprehensive Clinical Assessment - TELEMEDICINE   Name: Audrey [name] Date: 11/01/2024 MRN: [id number] DOB: 08-14-1986 PCP: Oris Camie BRAVO, NP  Session Time start: 5:05pm End time: 6pm Total time: 55 min  Client/Family location: Client's Home - [employer], [place] Therapist location: [facility] [place], [place] All persons participating in visit: Client & this therapist  I connected with client and/or family via Engineer, Civil (consulting)  (Video is Surveyor, mining) and verified that I am speaking with the correct person using two identifiers. Discussed confidentiality: Yes   I discussed the limitations of telemedicine and the availability of in person appointments.  Discussed there is a possibility of technology failure and discussed alternative modes of communication if that failure occurs.  I discussed that engaging in this telemedicine visit, they consent to the provision of behavioral healthcare and the services will be billed under their insurance.  Patient and/or legal guardian expressed understanding and consented to Telemedicine visit: Yes   Types of Service: Comprehensive Clinical Assessment (CCA)  Guardian/Payee:  Self    Paperwork requested: No   Reason for referral in patient/family's own words:  - Help with managing various stressors affecting her daily life  Client likes to be called Audrey [name].  Primary language at home is English.  SUBJECTIVE:   Standardized Assessments completed: GAD-7 and PHQ 9  11/01/2024  PHQ-9 Depression Screening Tool   Decreased Interest 1   Down, Depressed, Hopeless 1   Altered sleeping 1   Tired, decreased energy 1   Change in appetite 1   Feeling bad or failure about yourself 0   Trouble concentrating 0   Moving slowly or fidgety/restless   Suicidal thoughts 0   PHQ-9 Score 5   GAD 7 : Generalized Anxiety Score   Nervous, Anxious, on Edge 1   Control/stop  worrying 1   Worry too much - different things 2   Trouble relaxing 1   Restless 0   Easily annoyed or irritable 2   Afraid - awful might happen 0   Total GAD 7 Score 7      Risk Assessment: Danger to Self:  No Self-injurious Behavior: No Danger to Others: No Duty to Warn:no Physical Aggression / Violence:No  Access to Firearms a concern: Did not ask  Client and/or legal guardian was educated about steps to take if suicide or homicide risk level increases between visits: n/a While future psychiatric events cannot be accurately predicted, the patient does not currently require acute inpatient psychiatric care and does not currently meet [place]  involuntary commitment criteria.  Current Concerns or Stressors:   Single parent -having to figure things out by herself (2 children 69y yo & 61 yo) Grandmother's health condition-helps take care of her grandmother  Client and/or Family's Strengths/Protective Factors: Spirituality, Hopefulness, and Able to Communicate Effectively Helping people Showing up for people  Things she enjoys doing or is important to her: Reading Scriptures, Adult coloring book, Shopping  Current Health Habits: Sleep:   Sleeps the whole night, some nights wake up, Bedtime 10pm, Wake up around 10:30am. Some mornings- she doesn't feel rested  Physical Activities: None reported  Eating/Appetite: Eats breakfast if she's hungry, sometimes a protein shake, eats lunch & dinner - Not hungry when feeling anxious or depressed  Current Medications and therapies:  Psychotropic medications: Current medications: Lexapro 10 mg Client taking medications as prescribed:  Yes, in the morning Side effects reported: No  Therapies:  None  Psychiatric  Review of systems: Insomnia: Over the past summer Changes in appetite: Increase junk food Decreased need for sleep: No Hallucinations: No   Paranoia: No    Psychiatric History: Past psychiatry diagnosis:  No Patient currently being seen by therapist/psychiatrist:  No Prior Suicide Attempts: No Past psychiatry Hospitalization(s): No Past history of violence: No  2014 - Having panic attacks for a couple months after the loss of her baby. 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 no  Have you recently used any drugs, eg marijuana, other substances or prescriptions drugs not prescribed to them?  no  Have you recently consumed any tobacco or nicotine? no Does client seem concerned about dependence or abuse of any substance? no  Traumatic Experiences/Abuse history: Have you ever been exposed, witnessed or experienced any form of abuse, what type?  Neglect at a young age. Remembers that biological mother left her at school multiple times and that's when CPS became involved.  Have you ever been exposed, witnessed or experienced something traumatic, describe?  05/24/13- her baby died 2 days before her scheduled caesarean  Family of Origin (Childhood History) Client was Adopted- taken away from biological parents at 41 yo  Oldest of 3 girls She reported she feels like mother didn't show up - continues to be addicted  to substances Bio father in & out of jail Feels like I'm not enough   Family history: Family mental  illness:  No information Family history of bipolar disorder: No information Family school achievement history:  No information Other relevant family history:  Mother with substance use.  Family History:  Family History  Adopted: Yes  Problem Relation Age of Onset   Diabetes Mother    Asthma Other    Hyperlipidemia Other    Obesity Other    Diabetes Maternal Grandmother    Hypertension Maternal Grandmother    Diabetes Paternal Grandmother    Hypertension Paternal Grandmother      Client Medical history  Medical History/Surgical History: reviewed' Past Medical History:  Diagnosis Date   Anemia    r/t uterine bleeding   Arthritis    Asthma    Atypical chest pain 10/03/2020   Bilateral knee pain 01/27/2019   Breast lump on left side at 3 o'clock position 01/27/2019   Common migraine with intractable migraine 01/23/2017   Dandruff 03/24/2023   Fetal demise 24-May-2013   GERD (gastroesophageal reflux disease)    Gestational diabetes    Hx, A1C 4.9 04/2024   Headache    migraines   Hemorrhage after delivery of fetus 08/12/2006   Morbidly obese (HCC) 01/23/2017   Motion sickness 08/27/2023   Preeclampsia    Pregnancy induced hypertension    Rash and nonspecific skin eruption 01/17/2023   Rectal bleeding 06/16/2024   S/P cesarean section 05/20/2013   S/P cesarean section 11/18/2014   Sleep apnea 2025  Past Surgical History:  Procedure Laterality Date   CESAREAN SECTION  07/2006   CESAREAN SECTION N/A 05/19/2013   Procedure: CESAREAN SECTION;  Surgeon: Ezzie Marshall, MD;  Location: [facility];  Service: Obstetrics;  Laterality: N/A;   CESAREAN SECTION WITH BILATERAL TUBAL LIGATION Bilateral 11/17/2014   Procedure: CESAREAN SECTION WITH BILATERAL TUBAL LIGATION;  Surgeon: Ezzie Buba, MD;  Location: [facility];  Service: Obstetrics;  Laterality: Bilateral;   LAPAROSCOPIC LYSIS OF ADHESIONS  06/07/2024   Procedure: LYSIS, ADHESIONS, LAPAROSCOPIC;  Surgeon: Cleotilde Ronal RAMAN, MD;  Location: [facility];  Service: Gynecology;;   TOENAIL EXCISION     WISDOM TOOTH EXTRACTION      Medications: Current Outpatient Medications  Medication Sig Dispense Refill   acetaminophen  (TYLENOL ) 325 MG tablet Take 650 mg by mouth every 6 (six) hours as needed.     albuterol  (PROVENTIL ) (2.5 MG/3ML) 0.083% nebulizer solution Take 3 mLs (2.5 mg total) by nebulization every 6 (six) hours as needed for wheezing or shortness of breath. 75 mL 3   albuterol  (VENTOLIN  HFA) 108 (90 Base) MCG/ACT inhaler Inhale 1-2 puffs into the lungs every 6 (six) hours as needed for wheezing or shortness of breath. 6.7 g 1   amoxicillin -clavulanate (AUGMENTIN ) 875-125 MG tablet Take 1 tablet by mouth every 12 (twelve) hours for infection. Take with food to avoid stomach upset. 10 tablet 0   aspirin-acetaminophen -caffeine (EXCEDRIN MIGRAINE) 250-250-65 MG tablet Take 1 tablet by mouth every 6 (six) hours as needed for headache.     azelastine  (ASTELIN ) 0.1 % nasal spray Place 2 sprays into each nostril 2 (two) times daily as directed. 30 mL 12   budesonide  (PULMICORT ) 0.25 MG/2ML nebulizer solution Take 2 mLs (0.25 mg total) by nebulization daily. For bronchitis/asthma exacerbation. 30 mL 12   budesonide -formoterol  (SYMBICORT ) 160-4.5 MCG/ACT inhaler Inhale 2 puffs into the lungs 2 (two) times daily. 10.2 g 12   cyclobenzaprine  (FLEXERIL ) 10 MG tablet Take 1 tablet (10 mg total) by mouth 3 (three) times daily as needed for muscle spasms. 30 tablet 0   dicyclomine  (BENTYL ) 10 MG capsule Take 1 tablet by mouth as needed up to 4 times a day with meals and at bedtime for abdominal cramping. 90 capsule 3   escitalopram (LEXAPRO) 10 MG tablet Take 1 tablet (10 mg total) by mouth at bedtime. 30 tablet 1   famotidine  (PEPCID ) 20 MG tablet Take 1 tablet (20 mg total) by mouth at bedtime. 30 tablet 11   Ferrous Sulfate  (ONE VITE FERROUS SULFATE ) 300 MG/6.8ML SOLN Take 5 mLs (220mg  total) by mouth daily. 473 mL 11   Ferrous  Sulfate 5 MG/20ML SOLN Take 5 mLs by mouth daily. 473 mL 3   fluconazole  (DIFLUCAN ) 150 MG tablet Take 1 tablet by mouth at the first sign of symptoms. Repeat in 3 days if symptoms are still present. 2 tablet 3   fluticasone  furoate-vilanterol (BREO ELLIPTA ) 200-25 MCG/ACT AEPB Inhale 1 puff into the lungs daily.     Fremanezumab -vfrm (AJOVY ) 225 MG/1.5ML SOAJ Inject 225 mg into the skin every 30 (thirty) days. 1.5 mL 11   ibuprofen  (ADVIL ) 800 MG tablet Take 1 tablet (800 mg total) by mouth every 8 (eight) hours as needed. 45 tablet 2   levocetirizine (XYZAL ) 5 MG tablet Take 1 tablet (5 mg total) by mouth every evening. 90 tablet 3   LORazepam (ATIVAN) 0.5 MG tablet Take 1/2 to 1 tablet by mouth up to once daily as needed for panic/anxiety. 20 tablet 1  mometasone  (ELOCON ) 0.1 % cream Apply a thin layer to the affected area twice a day. 45 g 1   montelukast  (SINGULAIR ) 10 MG tablet Take 1 tablet (10 mg total) by mouth at bedtime. 90 tablet 3   norethindrone  (AYGESTIN ) 5 MG tablet Take 1 tablet (5 mg total) by mouth 2 (two) times daily. If ineffective, you may increase to 1 tablet 3 (three) times a day. 90 tablet 11   pantoprazole  (PROTONIX ) 40 MG tablet Take 1 tablet (40 mg total) by mouth daily. 90 tablet 3   Rimegepant Sulfate  (NURTEC) 75 MG TBDP Take one tablet by mouth at the first sign of migraine. No more than 1 dose every 24 hours. 15 tablet 6   traMADol  (ULTRAM ) 50 MG tablet Take 1 tablet (50 mg total) by mouth every 8 (eight) hours as needed. 20 tablet 0   tranexamic acid  (LYSTEDA ) 650 MG TABS tablet Take 2 tablets (1,300 mg total) by mouth 3 (three) times daily. 30 tablet 2   valACYclovir  (VALTREX ) 1000 MG tablet Take 2 tablets (2,000 mg total) by mouth 2 (two) times daily, for one day at the first sign of outbreak. 20 tablet 5   Vitamin D , Ergocalciferol , (DRISDOL ) 1.25 MG (50000 UNIT) CAPS capsule Take 1 capsule by mouth every Monday and Wednesday. 24 capsule 3   No current  facility-administered medications for this visit.    Allergies  Allergen Reactions   Sumatriptan Nausea Only    Dizziness      Interventions: Interventions utilized:  This Clinician reviewed information on new patient paperwork, explained services, identified presenting concerns, explored goals and built rapport. Obtained additional information for comprehensive assessment and developed general plan of care.   Client Response:  Ms. Hojnacki was open to sharing information for the initial assessment and trying therapy.    She reported one her goals in therapy are: Letting go of grudges Working on Relationships Recruitment Consultant and adopted families)  Mental status exam:   General Appearance Siegfried:  Neat Eye Contact:  Good Motor Behavior:  Normal Speech:  Normal Level of Consciousness:  Alert Mood:  Anxious Affect:  Appropriate Anxiety Level:  Minimal Thought Process:  Coherent Thought Content:  WNL Perception:  Normal Judgment:  Good Insight:  Present   Clinical Assessment: Ms. Westra is a 38 yo female who presents with various stressors and has experienced multiple losses.  Ms. Harral indicated she has not processed many of her past experiences that may have been traumatic for her.  Ms. Legere is open to psycho therapy and enhancing her coping skills to manage her stress level.  Although Ms. Junod did not report elevated symptoms of anxiety & depression, she may be internalizing them and not ready to verbalize them with this clinician at this time.   Diagnosis: Adjustment disorder with mixed anxiety and depressed mood  Coordination of Care: Written progress or summary reports from medical chart.   Recommendations for Services/Supports/Treatments: Individual outpatient therapy to learn strategies and enhance coping skills   Follow up Plan: A follow-up was scheduled to create a more specific treatment plan and begin treatment. Therapist answered all questions during the  evaluation and contact information was provided.   Michille Mcelrath P. Trudy, MSW, LCSW [employer] Therapist Main Office: [phone]

## 2024-11-02 ENCOUNTER — Other Ambulatory Visit (HOSPITAL_COMMUNITY): Payer: Self-pay

## 2024-11-02 ENCOUNTER — Encounter: Payer: Self-pay | Admitting: Nurse Practitioner

## 2024-11-02 ENCOUNTER — Telehealth (INDEPENDENT_AMBULATORY_CARE_PROVIDER_SITE_OTHER): Admitting: Nurse Practitioner

## 2024-11-02 VITALS — Wt 283.0 lb

## 2024-11-02 DIAGNOSIS — J020 Streptococcal pharyngitis: Secondary | ICD-10-CM | POA: Diagnosis not present

## 2024-11-02 MED ORDER — FLUCONAZOLE 150 MG PO TABS
ORAL_TABLET | ORAL | 3 refills | Status: AC
  Filled 2024-11-02: qty 2, 4d supply, fill #0

## 2024-11-02 MED ORDER — IBUPROFEN 800 MG PO TABS
800.0000 mg | ORAL_TABLET | Freq: Three times a day (TID) | ORAL | 2 refills | Status: AC | PRN
  Filled 2024-11-02: qty 45, 15d supply, fill #0

## 2024-11-02 MED ORDER — AMOXICILLIN-POT CLAVULANATE 875-125 MG PO TABS
1.0000 | ORAL_TABLET | Freq: Two times a day (BID) | ORAL | 0 refills | Status: AC
  Filled 2024-11-02: qty 10, 5d supply, fill #0

## 2024-11-02 NOTE — Progress Notes (Signed)
 Virtual Visit Encounter mychart visit.   I connected with  Carlee D Mackiewicz on 11/02/24 at 11:15 AM EST by secure video and audio telemedicine application. I verified that I am speaking with the correct person using two identifiers.   I introduced myself as a Publishing Rights Manager with the practice. The limitations of evaluation and management by telemedicine discussed with the patient and the availability of in person appointments. The patient expressed verbal understanding and consent to proceed.  Participating parties in this visit include: Myself and patient  The patient is: Patient Location: Other:  work I am: Provider Location: Office/Clinic Subjective:    CC and HPI:  Audrey [name] presents today with concerns for very sore throat with redness, white patches, and post nasal drip. Her symptoms started about 2 days ago. She denies sinus pain or pressure or productive cough. She does not think she has had a fever.   She has been using warm tea and mucinex  to help with her symptoms. She found an older prescription of amoxicillin  that she took this morning and she feels like this has made a difference in the severity of pain in her throat.   Past medical history, Surgical history, Family history not pertinant except as noted below, Social history, Allergies, and medications have been entered into the medical record, reviewed, and corrections made.   Review of Systems:  All review of systems negative except what is listed in the HPI  Objective:    Alert and oriented x 4 Hoarse Speaking in clear sentences with no shortness of breath. No distress.  Impression and Recommendations:   Assessment and Plan Assessment & Plan     Problem List Items Addressed This Visit   None Visit Diagnoses       Strep pharyngitis    -  Primary   Symptoms consistent with strep throat with severe sore throat and exudate present. Will start treatment with augmentin  as she tends to have better success with this  for URI and similar symptoms. Ibuprofen  for throat pain. Fluconazole  sent for yeast, which patient frequently gets with oral antibiotics.    Relevant Medications   amoxicillin -clavulanate (AUGMENTIN ) 875-125 MG tablet   fluconazole  (DIFLUCAN ) 150 MG tablet      orders and follow up as documented in EMR I discussed the assessment and treatment plan with the patient. The patient was provided an opportunity to ask questions and all were answered. The patient agreed with the plan and demonstrated an understanding of the instructions.   The patient was advised to call back or seek an in-person evaluation if the symptoms worsen or if the condition fails to improve as anticipated.  Follow-Up: prn  I provided 10 minutes of non-face-to-face interaction with this non face-to-face encounter including intake, same-day documentation, and chart review.   Camie CHARLENA Doing, NP , DNP, AGNP-c [facility]

## 2024-11-05 ENCOUNTER — Encounter: Payer: Self-pay | Admitting: Clinical

## 2024-11-07 ENCOUNTER — Ambulatory Visit

## 2024-11-07 VITALS — BP 127/89 | HR 70 | Temp 99.2°F | Resp 14 | Ht 68.0 in | Wt 282.0 lb

## 2024-11-07 DIAGNOSIS — E611 Iron deficiency: Secondary | ICD-10-CM

## 2024-11-07 DIAGNOSIS — D500 Iron deficiency anemia secondary to blood loss (chronic): Secondary | ICD-10-CM

## 2024-11-07 MED ORDER — SODIUM CHLORIDE 0.9 % IV SOLN
300.0000 mg | Freq: Once | INTRAVENOUS | Status: AC
  Administered 2024-11-07: 300 mg via INTRAVENOUS
  Filled 2024-11-07: qty 15

## 2024-11-07 NOTE — Progress Notes (Signed)
 Diagnosis: Iron  Deficiency Anemia  Provider:  Praveen Mannam MD  Procedure: IV Infusion  IV Type: Peripheral, IV Location: R Forearm  Venofer  (Iron  Sucrose), Dose: 300 mg  Infusion Start Time: 1410  Infusion Stop Time: 1546  Post Infusion IV Care: Patient declined observation and Peripheral IV Discontinued  Discharge: Condition: Good, Destination: Home . AVS Declined  Performed by:  Traevion Poehler, RN

## 2024-11-16 ENCOUNTER — Other Ambulatory Visit: Payer: Self-pay

## 2024-11-16 ENCOUNTER — Other Ambulatory Visit (HOSPITAL_COMMUNITY): Payer: Self-pay

## 2024-11-16 DIAGNOSIS — G43411 Hemiplegic migraine, intractable, with status migrainosus: Secondary | ICD-10-CM

## 2024-11-16 MED ORDER — AJOVY 225 MG/1.5ML ~~LOC~~ SOAJ
225.0000 mg | SUBCUTANEOUS | 2 refills | Status: AC
  Filled 2024-11-17 (×2): qty 4.5, 90d supply, fill #0

## 2024-11-17 ENCOUNTER — Other Ambulatory Visit (HOSPITAL_COMMUNITY): Payer: Self-pay

## 2024-11-23 ENCOUNTER — Ambulatory Visit: Admitting: Clinical

## 2024-11-23 DIAGNOSIS — F4323 Adjustment disorder with mixed anxiety and depressed mood: Secondary | ICD-10-CM

## 2024-11-23 NOTE — Progress Notes (Signed)
 Behavioral Health Treatment Plan   Name:Audrey [name] [facility]: Jolynn Pack Athalia [phone]   MRN: [id number]   Treatment Plan Development Date: 11/23/24   Strengths:  Spirituality, Hopefulness, and Able to Communicate Effectively Helping people Showing up for people Things she enjoys doing or is important to her: Reading Scriptures, Adult coloring book, Shopping  Supports: Friends   Theatre Manager of Needs: Wants help with managing various stressors affecting her   Treatment Level: Outpatient Individual Psycho therapy  Client Treatment Preferences:TeleMedicine or In-Person   Diagnosis: Adjustment Disorder  Increased stressors with parenting, work, and caring for her grandmother  Symptoms:  Increased anxiety symptoms and difficulties with sleep   Goals:  Improve coping skills by learning effective ways to adapt and manage anxiety.  Objectives: Target Date For All Objectives: 05/29/2025  Interrupt negative thinking and increase positive thoughts and feelings and Build resilience and learn to adapt to stress and hardships.  Progress Documentation:   Progressing  Interventions:  Cognitive Behavioral Therapy, Mindfulness Meditation, and Psycho-education/Bibliotherapy   Expected duration of treatment: 6-12 months  Party responsible for implementation of interventions: This Clinician, Rolin Pouch, LCSW.   This plan has been reviewed and created by the following participants: Client & this clinician   A new plan will be created at least every 12 months.  The patient fully participated in the development of treatment plan with the clinician and verbally consents to such treatment.   Patient Treatment Plan Signature Obtained: No, pending signature via MyChart.   Kento Gossman SHAUNNA Pouch, LCSW

## 2024-11-23 NOTE — Progress Notes (Signed)
 [facility] Counselor Progress Note - TELEMEDICINE VISIT  Patient ID: GENAVIVE KUBICKI, MRN: [id number]    Date: 11/23/24  Time Spent: 11:30am  - 12:30pm  : 60 Minutes  Types of Service: Individual psychotherapy and Video visit  Client and/or Legal Guardian location: Client's home  Therapist location:  [facility] [place], [place]  All persons participating in visit: Client & this therapist  I connected with client and/or legal guardian via Video Enabled Telemedicine Application  (Video is Caregility application) and verified that I am speaking with the correct person using two identifiers. Discussed confidentiality: Yes   I discussed the limitations of telemedicine and the availability of in person appointments.  Discussed there is a possibility of technology failure and discussed alternative modes of communication if that failure occurs.  I discussed that engaging in this telemedicine visit, they consent to the provision of behavioral healthcare and the services will be billed under their insurance.  Client and/or legal guardian expressed understanding and consented to Telemedicine visit: Yes    Presenting Concerns:  - ongoing stressors with parenting, limited finances and health concerns  Mental Status Exam: Appearance:  Casual     Behavior: Appropriate  Motor: Normal  Speech/Language:  Normal Rate  Affect: Appropriate  Mood: anxious  Thought process: normal  Thought content:   WNL  Sensory/Perceptual disturbances:   WNL  Orientation: oriented to person, place, time/date, situation, and day of week  Attention: Good  Concentration: Good  Memory: WNL  Fund of knowledge:  Good  Insight:   Good  Judgment:  Good  Impulse Control: Good   Risk Assessment: Danger to Self:  No Self-injurious Behavior: No Danger to Others: No Duty to Warn:no   Subjective:  Financial stress, Single parenting   Interventions: Developed Treatment Plan  Psycho-education/Bibliotherapy and Relaxation strategies, Mindfulness  Client Response: Taleeya reported difficulties with her health which is affecting her sleep and anxiety level.  She is trying to take her medicines consistently and she feels better the last few days.  She was open to information on relaxation strategies including mindfulness and progressive muscle relaxation skills. She reported she has tried the deep breathing which has helped her. She was open to practicing mindfulness for 5 minutes each day.   Diagnosis:  Adjustment disorder with mixed anxiety and depressed mood   Goals, Assessment & Plan:   Ms. Lutzke continues to experienced multiple stressors affecting her sleep and anxiety level.  She has coping strategies that she implements and was open to learning other ones she can practice.  Treatment Level 2-3 times a month  Modality - TeleMedicine - Video  Goal: Improve coping skills by learning effective ways to adapt and manage anxiety.  Objectives: Target Date For All Objectives: 05/29/2025  Interrupt negative thinking and increase positive thoughts and feelings and Build resilience and learn to adapt to stress and hardships.   Vesta Wheeland P. Trudy, MSW, LCSW [employer] Therapist Main Office: [phone]

## 2024-11-29 ENCOUNTER — Ambulatory Visit: Admitting: Clinical

## 2024-11-29 DIAGNOSIS — F4323 Adjustment disorder with mixed anxiety and depressed mood: Secondary | ICD-10-CM | POA: Diagnosis not present

## 2024-11-29 NOTE — Progress Notes (Signed)
  Behavioral Health Counselor Progress Note - TELEMEDICINE VISIT  Patient ID: BABE ANTHIS, MRN: [id number]    Date: 11/29/24  Time Spent: 3pm   - 3:53  : 53 Minutes  Types of Service: Individual psychotherapy and Video visit  Client and/or Legal Guardian location: Client's home-[place], [place] Therapist location: [facility] [place], [place] All persons participating in visit: Client & this therapist  I connected with client and/or legal guardian via [employer] (consulting)  (Video is Caregility application) and verified that I am speaking with the correct person using two identifiers. Discussed confidentiality: Yes   I discussed the limitations of telemedicine and the availability of in person appointments.  Discussed there is a possibility of technology failure and discussed alternative modes of communication if that failure occurs.  I discussed that engaging in this telemedicine visit, they consent to the provision of behavioral healthcare and the services will be billed under their insurance.  Client and/or legal guardian expressed understanding and consented to Telemedicine visit: Yes    Presenting Concerns:  - ongoing stressors with health, finances and family relationships  Mental Status Exam: Appearance:  Casual     Behavior: Appropriate  Motor: Normal  Speech/Language:  Normal Rate  Affect: Appropriate  Mood: anxious  Thought process: normal  Thought content:   WNL  Sensory/Perceptual disturbances:   WNL  Orientation: oriented to person, place, time/date, situation, and day of week  Attention: Good  Concentration: Good  Memory: WNL  Fund of knowledge:  Good  Insight:   Good  Judgment:  Good  Impulse Control: Good   Risk Assessment: Danger to Self:  No Self-injurious Behavior: No Danger to Others: No Duty to Warn:no   Subjective:  Sabra reported ongoing stressors with her health, finances and family relationships.    Interventions: Humanistic/Existential and Positive Psychology  Client Response: Manjit reported that she's been able to take time for herself to rest the last week.  Carnetta started to share her experiences from childhood and how it's helped her be the person she is now.  Although she has limited finances, she's very generous in giving to others with her money and time because she understands how difficult it can be.  She also shared how her faith in God continues to be important to her and how she is able to cope through life.   Diagnosis:  Adjustment disorder with mixed anxiety and depressed mood   Goals, Assessment & Plan:   Ms. Chelsee reported ongoing stressors due to her current health condition, recent death in the family, limited finances and support. She was able to identify strengths and coping strategies that she's been able to implement to manage her anxiety symptoms.  Treatment Level 2-3 times a month  Modality - TeleMedicine - Video  Goals: Improve coping skills by learning effective ways to adapt and manage anxiety.   Objectives:  Interrupt negative thinking and increase positive thoughts and feelings - Samhita reported she believes that although she's been through difficult times, it's helped her be the person she is, more empathic.  Build resilience and learn to adapt to stress and hardships. - Kalysta reported being more intentional in taking time for herself to rest or do things that can help her.   Target Date: 05/29/2025  Progress: Ongoing    Winfred Iiams P. Trudy, MSW, LCSW [employer] Therapist Main Office: [phone]

## 2024-11-30 ENCOUNTER — Other Ambulatory Visit: Payer: Self-pay

## 2024-11-30 ENCOUNTER — Other Ambulatory Visit (HOSPITAL_COMMUNITY): Payer: Self-pay

## 2024-11-30 ENCOUNTER — Encounter: Payer: Self-pay | Admitting: Nurse Practitioner

## 2024-11-30 MED ORDER — DOXYCYCLINE HYCLATE 100 MG PO TABS
100.0000 mg | ORAL_TABLET | Freq: Two times a day (BID) | ORAL | 0 refills | Status: AC
  Filled 2024-11-30: qty 14, 7d supply, fill #0

## 2024-11-30 NOTE — Telephone Encounter (Signed)
 Per Camie, Rx sent to pharmacy

## 2024-12-12 ENCOUNTER — Encounter: Payer: Self-pay | Admitting: Nurse Practitioner

## 2024-12-12 DIAGNOSIS — G8929 Other chronic pain: Secondary | ICD-10-CM

## 2024-12-12 DIAGNOSIS — R109 Unspecified abdominal pain: Secondary | ICD-10-CM

## 2024-12-12 DIAGNOSIS — N921 Excessive and frequent menstruation with irregular cycle: Secondary | ICD-10-CM

## 2024-12-12 DIAGNOSIS — N736 Female pelvic peritoneal adhesions (postinfective): Secondary | ICD-10-CM

## 2024-12-13 ENCOUNTER — Other Ambulatory Visit

## 2024-12-13 ENCOUNTER — Other Ambulatory Visit (HOSPITAL_COMMUNITY): Payer: Self-pay

## 2024-12-13 ENCOUNTER — Ambulatory Visit: Admitting: Clinical

## 2024-12-13 ENCOUNTER — Other Ambulatory Visit: Payer: Self-pay | Admitting: Nurse Practitioner

## 2024-12-13 DIAGNOSIS — N921 Excessive and frequent menstruation with irregular cycle: Secondary | ICD-10-CM | POA: Diagnosis not present

## 2024-12-13 DIAGNOSIS — N736 Female pelvic peritoneal adhesions (postinfective): Secondary | ICD-10-CM | POA: Diagnosis not present

## 2024-12-13 DIAGNOSIS — R109 Unspecified abdominal pain: Secondary | ICD-10-CM | POA: Diagnosis not present

## 2024-12-13 MED ORDER — TRAMADOL HCL 50 MG PO TABS
50.0000 mg | ORAL_TABLET | Freq: Three times a day (TID) | ORAL | 0 refills | Status: DC | PRN
  Filled 2024-12-13: qty 20, 7d supply, fill #0

## 2024-12-14 ENCOUNTER — Other Ambulatory Visit: Payer: Self-pay | Admitting: Nurse Practitioner

## 2024-12-14 ENCOUNTER — Encounter: Payer: Self-pay | Admitting: Nurse Practitioner

## 2024-12-14 ENCOUNTER — Other Ambulatory Visit (HOSPITAL_COMMUNITY): Payer: Self-pay

## 2024-12-14 DIAGNOSIS — N946 Dysmenorrhea, unspecified: Secondary | ICD-10-CM

## 2024-12-14 LAB — CBC WITH DIFFERENTIAL/PLATELET
Basophils Absolute: 0.1 x10E3/uL (ref 0.0–0.2)
Basos: 1 %
EOS (ABSOLUTE): 0.3 x10E3/uL (ref 0.0–0.4)
Eos: 2 %
Hematocrit: 39.4 % (ref 34.0–46.6)
Hemoglobin: 12.2 g/dL (ref 11.1–15.9)
Immature Grans (Abs): 0.1 x10E3/uL (ref 0.0–0.1)
Immature Granulocytes: 1 %
Lymphocytes Absolute: 3.2 x10E3/uL — ABNORMAL HIGH (ref 0.7–3.1)
Lymphs: 30 %
MCH: 28.1 pg (ref 26.6–33.0)
MCHC: 31 g/dL — ABNORMAL LOW (ref 31.5–35.7)
MCV: 91 fL (ref 79–97)
Monocytes Absolute: 0.9 x10E3/uL (ref 0.1–0.9)
Monocytes: 8 %
Neutrophils Absolute: 6.4 x10E3/uL (ref 1.4–7.0)
Neutrophils: 58 %
Platelets: 431 x10E3/uL (ref 150–450)
RBC: 4.34 x10E6/uL (ref 3.77–5.28)
RDW: 15.4 % (ref 11.7–15.4)
WBC: 10.9 x10E3/uL — ABNORMAL HIGH (ref 3.4–10.8)

## 2024-12-14 LAB — IRON,TIBC AND FERRITIN PANEL
Ferritin: 146 ng/mL (ref 15–150)
Iron Saturation: 13 % — ABNORMAL LOW (ref 15–55)
Iron: 45 ug/dL (ref 27–159)
Total Iron Binding Capacity: 347 ug/dL (ref 250–450)
UIBC: 302 ug/dL (ref 131–425)

## 2024-12-14 LAB — PROTIME-INR
INR: 1 (ref 0.9–1.2)
Prothrombin Time: 10.5 s (ref 9.1–12.0)

## 2024-12-14 LAB — TSH+FREE T4
Free T4: 1.01 ng/dL (ref 0.82–1.77)
TSH: 1.22 u[IU]/mL (ref 0.450–4.500)

## 2024-12-14 LAB — VON WILLEBRAND PANEL
Factor VIII Activity: 185 % — ABNORMAL HIGH (ref 56–140)
Von Willebrand Ag: 147 % (ref 50–200)
Von Willebrand Factor: 146 % (ref 50–200)

## 2024-12-14 LAB — COAG STUDIES INTERP REPORT

## 2024-12-14 MED ORDER — HYDROCODONE-ACETAMINOPHEN 5-325 MG PO TABS: 1.0000 | ORAL_TABLET | Freq: Four times a day (QID) | ORAL | 0 refills | Status: DC | PRN

## 2024-12-14 MED ORDER — HYDROCODONE-ACETAMINOPHEN 5-325 MG PO TABS
1.0000 | ORAL_TABLET | Freq: Four times a day (QID) | ORAL | 0 refills | Status: AC | PRN
  Filled 2024-12-14: qty 15, 4d supply, fill #0

## 2024-12-15 ENCOUNTER — Ambulatory Visit: Payer: Self-pay | Admitting: Nurse Practitioner

## 2024-12-16 ENCOUNTER — Other Ambulatory Visit (HOSPITAL_COMMUNITY): Payer: Self-pay

## 2024-12-26 ENCOUNTER — Encounter (HOSPITAL_BASED_OUTPATIENT_CLINIC_OR_DEPARTMENT_OTHER): Payer: Self-pay | Admitting: Obstetrics & Gynecology

## 2024-12-26 ENCOUNTER — Telehealth: Payer: Self-pay | Admitting: Clinical

## 2024-12-27 ENCOUNTER — Ambulatory Visit: Admitting: Clinical

## 2024-12-27 ENCOUNTER — Encounter (HOSPITAL_BASED_OUTPATIENT_CLINIC_OR_DEPARTMENT_OTHER): Payer: Self-pay | Admitting: Obstetrics & Gynecology

## 2024-12-27 NOTE — Telephone Encounter (Signed)
 TC to patient since this clinician received message to call her.  This clinician spoke with patient regarding appointments and offered support.

## 2025-01-02 ENCOUNTER — Ambulatory Visit (HOSPITAL_BASED_OUTPATIENT_CLINIC_OR_DEPARTMENT_OTHER): Payer: Self-pay | Admitting: Obstetrics & Gynecology

## 2025-01-02 ENCOUNTER — Encounter (HOSPITAL_BASED_OUTPATIENT_CLINIC_OR_DEPARTMENT_OTHER): Payer: Self-pay | Admitting: Obstetrics & Gynecology

## 2025-01-02 VITALS — BP 116/85 | HR 75 | Ht 68.0 in | Wt 282.0 lb

## 2025-01-02 DIAGNOSIS — Z30430 Encounter for insertion of intrauterine contraceptive device: Secondary | ICD-10-CM

## 2025-01-02 MED ORDER — LEVONORGESTREL 20 MCG/DAY IU IUD
1.0000 | INTRAUTERINE_SYSTEM | Freq: Once | INTRAUTERINE | Status: AC
  Administered 2025-01-02: 1 via INTRAUTERINE

## 2025-01-02 NOTE — Progress Notes (Signed)
 39 y.o. [id number] Single African American female presents for insertion of Mirena  for menorrhagia with irregular bleeding.  Pt has hx of c section x 3 and significant pelvic adhesions.  She underwent laparoscopy in June that was planned as a hysterectomy due LOA was performed for about two hours before uterus fundus was seen.  She went to [facility] for a consult regarding surgery.  She has decided not to proceed with surgery at this time.  Since that time she's has significant irregular bleeding that has been heavy at times.  She has been on norethindrone  2m BID.  She bled basically all of December.  She has decided to proceed with IUD placement with a Mirena .   Pt has been counseled about risks and benefits as well as complications.  Consent is obtained today.  All questions answered prior to start of procedure.    Not bleeding today.  Has been about 2 1/2 days since bleeding stopped.  Current contraception: abstinance  Last STD testing:  not indicated as not SA for >2 years LMP:  Patient's last menstrual period was 11/28/2024.  Patient Active Problem List   Diagnosis Date Noted   Iron  deficiency 10/07/2024   Situational anxiety 09/23/2024   Chronic bilateral low back pain without sciatica 09/23/2024   Abdominal cramping 09/23/2024   Fatigue 09/23/2024   OSA (obstructive sleep apnea) 09/23/2024   Posterior rhinorrhea 09/23/2024   Pelvic adhesions 06/16/2024   Primary insomnia 12/11/2023   Moderate persistent asthma without complication 09/25/2023   Encounter for annual physical exam 09/18/2023   Acne vulgaris 03/24/2023   Menorrhagia with irregular cycle 01/17/2023   Insulin  resistance 01/17/2023   Iron  deficiency anemia due to chronic blood loss 11/17/2022   Osteoarthritis of knees, bilateral 07/13/2020   Class 3 severe obesity with body mass index (BMI) of 40.0 to 44.9 in adult [facility]) 01/27/2019   Reflux gastritis 01/26/2019   Migraine 07/12/2014   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    Past Medical History:  Diagnosis Date   Anemia    r/t uterine bleeding   Arthritis    Asthma    Atypical chest pain 10/03/2020   Bilateral knee pain 01/27/2019   Breast lump on left side at 3 o'clock position 01/27/2019   Common migraine with intractable migraine 01/23/2017   Dandruff 03/24/2023   Fetal demise 05/19/2013   GERD (gastroesophageal reflux disease)    Gestational diabetes    Hx, A1C 4.9 04/2024   Headache    migraines   Hemorrhage after delivery of fetus 08/12/2006   Morbidly obese (HCC) 01/23/2017   Motion sickness 08/27/2023   Preeclampsia    Pregnancy induced hypertension    Rash and nonspecific skin eruption 01/17/2023   Rectal bleeding 06/16/2024   Sleep apnea 2025   Medications Ordered Prior to Encounter[1] Sumatriptan  Review of Systems  Constitutional: Negative.   Genitourinary:        Irregular and heavy bleeding   Vitals:   01/02/25 0821  BP: 116/85  Pulse: 75  Weight: 282 lb (127.9 kg)  Height: 5' 8 (1.727 m)    Gen:  WNWF healthy female NAD Abdomen: soft, non-tender Groin:  no inguinal nodes palpated  Pelvic exam: Vulva:  normal female genitalia Vagina:  normal vagina Cervix:  Non-tender, Negative CMT, no lesions or redness. Uterus:  normal shape, position and consistency   Procedure:  Speculum reinserted.  Cervix visualized and cleansed with Betadine  x 3.  Paracervical block was not placed.  Single toothed tenaculum applied  to anterior lip of cervix without difficulty.  1% Lidocaine  without epinephrine  was used. Uterus sounded to 8cm.  IUD package was opened.  IUD and introducer passed to fundus and then withdrawn slightly before IUD was passed into endometrial cavity.  Introducer removed.  Strings cut to 2.5cm.  Tenaculum removed from cervix.  Minimal bleeding noted.  Pt tolerated the procedure well.  All instruments removed from vagina.  Bedside ultrasound was performed for IUD placement and this was noted within the endometrial  cavity.  Assessment/Plan: 1. Encounter for insertion of Mirena  IUD (Primary) - levonorgestrel  (MIRENA ) 20 MCG/DAY IUD 1 each - she will continue norethindrone  5mg  bid for 3-4 weeks, then stop.  Pt aware expulsion of IUD most likely with first bleeding episode or with heavy bleeding/clots so I would like her to have at least a few week with no bleeding before stopping the progesterone.  - Return for recheck 6 - Pt aware to call for any concerns -  Timing for removal and replacement discussed.  Card given to pt.     [1]  Current Outpatient Medications on File Prior to Visit  Medication Sig Dispense Refill   albuterol  (PROVENTIL ) (2.5 MG/3ML) 0.083% nebulizer solution Take 3 mLs (2.5 mg total) by nebulization every 6 (six) hours as needed for wheezing or shortness of breath. 75 mL 3   albuterol  (VENTOLIN  HFA) 108 (90 Base) MCG/ACT inhaler Inhale 1-2 puffs into the lungs every 6 (six) hours as needed for wheezing or shortness of breath. 6.7 g 1   amoxicillin -clavulanate (AUGMENTIN ) 875-125 MG tablet Take 1 tablet by mouth every 12 (twelve) hours for infection. Take with food to avoid stomach upset. 10 tablet 0   aspirin-acetaminophen -caffeine (EXCEDRIN MIGRAINE) 250-250-65 MG tablet Take 1 tablet by mouth every 6 (six) hours as needed for headache.     azelastine  (ASTELIN ) 0.1 % nasal spray Place 2 sprays into each nostril 2 (two) times daily as directed. 30 mL 12   budesonide  (PULMICORT ) 0.25 MG/2ML nebulizer solution Take 2 mLs (0.25 mg total) by nebulization daily. For bronchitis/asthma exacerbation. 30 mL 12   budesonide -formoterol  (SYMBICORT ) 160-4.5 MCG/ACT inhaler Inhale 2 puffs into the lungs 2 (two) times daily. 10.2 g 12   cyclobenzaprine  (FLEXERIL ) 10 MG tablet Take 1 tablet (10 mg total) by mouth 3 (three) times daily as needed for muscle spasms. 30 tablet 0   dicyclomine  (BENTYL ) 10 MG capsule Take 1 tablet by mouth as needed up to 4 times a day with meals and at bedtime for abdominal  cramping. 90 capsule 3   doxycycline  (VIBRA -TABS) 100 MG tablet Take 1 tablet (100 mg total) by mouth 2 (two) times daily. 14 tablet 0   escitalopram  (LEXAPRO ) 10 MG tablet Take 1 tablet (10 mg total) by mouth at bedtime. 30 tablet 1   famotidine  (PEPCID ) 20 MG tablet Take 1 tablet (20 mg total) by mouth at bedtime. 30 tablet 11   Ferrous Sulfate  (ONE VITE FERROUS SULFATE ) 300 MG/6.8ML SOLN Take 5 mLs (220mg  total) by mouth daily. 473 mL 11   Ferrous Sulfate  5 MG/20ML SOLN Take 5 mLs by mouth daily. 473 mL 3   fluconazole  (DIFLUCAN ) 150 MG tablet Take 1 tablet by mouth at the first sign of symptoms. Repeat in 3 days if symptoms are still present. 2 tablet 3   fluticasone  furoate-vilanterol (BREO ELLIPTA ) 200-25 MCG/ACT AEPB Inhale 1 puff into the lungs daily.     Fremanezumab -vfrm (AJOVY ) 225 MG/1.5ML SOAJ Inject 225 mg into the skin every 30 (  thirty) days. 4.5 mL 2   HYDROcodone -acetaminophen  (NORCO/VICODIN) 5-325 MG tablet Take 1 tablet by mouth every 6 (six) hours as needed for moderate pain (pain score 4-6). 15 tablet 0   ibuprofen  (ADVIL ) 800 MG tablet Take 1 tablet (800 mg total) by mouth every 8 (eight) hours as needed. 45 tablet 2   levocetirizine (XYZAL ) 5 MG tablet Take 1 tablet (5 mg total) by mouth every evening. 90 tablet 3   LORazepam  (ATIVAN ) 0.5 MG tablet Take 1/2 to 1 tablet by mouth up to once daily as needed for panic/anxiety. 20 tablet 1   mometasone  (ELOCON ) 0.1 % cream Apply a thin layer to the affected area twice a day. 45 g 1   montelukast  (SINGULAIR ) 10 MG tablet Take 1 tablet (10 mg total) by mouth at bedtime. 90 tablet 3   norethindrone  (AYGESTIN ) 5 MG tablet Take 1 tablet (5 mg total) by mouth 2 (two) times daily. If ineffective, you may increase to 1 tablet 3 (three) times a day. 90 tablet 11   pantoprazole  (PROTONIX ) 40 MG tablet Take 1 tablet (40 mg total) by mouth daily. 90 tablet 3   Rimegepant Sulfate  (NURTEC) 75 MG TBDP Take one tablet by mouth at the first sign of  migraine. No more than 1 dose every 24 hours. 15 tablet 6   tranexamic acid  (LYSTEDA ) 650 MG TABS tablet Take 2 tablets (1,300 mg total) by mouth 3 (three) times daily. 30 tablet 2   valACYclovir  (VALTREX ) 1000 MG tablet Take 2 tablets (2,000 mg total) by mouth 2 (two) times daily, for one day at the first sign of outbreak. 20 tablet 5   Vitamin D , Ergocalciferol , (DRISDOL ) 1.25 MG (50000 UNIT) CAPS capsule Take 1 capsule by mouth every Monday and Wednesday. 24 capsule 3   No current facility-administered medications on file prior to visit.

## 2025-01-10 ENCOUNTER — Ambulatory Visit: Admitting: Clinical

## 2025-01-10 DIAGNOSIS — F4323 Adjustment disorder with mixed anxiety and depressed mood: Secondary | ICD-10-CM | POA: Diagnosis not present
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
## 2025-01-13 ENCOUNTER — Other Ambulatory Visit (HOSPITAL_COMMUNITY): Payer: Self-pay

## 2025-01-17 ENCOUNTER — Ambulatory Visit (HOSPITAL_BASED_OUTPATIENT_CLINIC_OR_DEPARTMENT_OTHER): Payer: Self-pay | Admitting: Obstetrics & Gynecology

## 2025-01-23 ENCOUNTER — Ambulatory Visit: Admitting: Clinical

## 2025-01-23 DIAGNOSIS — F4323 Adjustment disorder with mixed anxiety and depressed mood: Secondary | ICD-10-CM
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
## 2025-01-24 ENCOUNTER — Ambulatory Visit: Admitting: Clinical

## 2025-01-25 ENCOUNTER — Encounter: Payer: Self-pay | Admitting: Family Medicine

## 2025-01-25 ENCOUNTER — Ambulatory Visit: Admitting: Family Medicine

## 2025-01-25 ENCOUNTER — Ambulatory Visit: Payer: Self-pay

## 2025-01-25 VITALS — BP 122/70 | HR 96 | Temp 98.2°F | Ht 68.0 in | Wt 282.0 lb

## 2025-01-25 DIAGNOSIS — J4540 Moderate persistent asthma, uncomplicated: Secondary | ICD-10-CM | POA: Diagnosis not present

## 2025-01-25 DIAGNOSIS — R52 Pain, unspecified: Secondary | ICD-10-CM | POA: Diagnosis not present

## 2025-01-25 DIAGNOSIS — R6883 Chills (without fever): Secondary | ICD-10-CM

## 2025-01-25 DIAGNOSIS — J029 Acute pharyngitis, unspecified: Secondary | ICD-10-CM | POA: Diagnosis not present

## 2025-01-25 LAB — POC COVID19/FLU A&B COMBO
Covid Antigen, POC: NEGATIVE
Influenza A Antigen, POC: NEGATIVE
Influenza B Antigen, POC: NEGATIVE

## 2025-01-25 LAB — POCT RAPID STREP A (OFFICE): Rapid Strep A Screen: NEGATIVE

## 2025-01-25 NOTE — Progress Notes (Signed)
" ° °  Subjective:    Patient ID: Audrey [name], female    DOB: 1986/01/17, 39 y.o.   MRN: [id number]  Discussed the use of AI scribe software for clinical note transcription with the patient, who gave verbal consent to proceed.  History of Present Illness   Audrey [name] is a 39 year old female with asthma who presents with body chills, aches, and a runny nose.  She has been experiencing body chills and body aches since yesterday, which were preceded by a runny nose three days ago. She also has sneezing and intermittent chest pains. Her throat feels scratchy, but she has no sore throat. She began coughing upon arrival at the clinic.  She has been managing her symptoms with nighttime Mucinex  for congestion and used a nebulizer for chest pain, which provided relief. She regularly uses Breo for her asthma.  She is concerned about the possibility of having the flu or COVID-19, particularly due to the body aches and chills. The body chills are intermittent and the body pain is widespread.           Review of Systems     Objective:    Physical Exam Alert and in no distress. Tympanic membranes and canals are normal. Pharyngeal area is normal. Neck is supple without adenopathy or thyromegaly. Cardiac exam shows a regular sinus rhythm without murmurs or gallops. Lungs are clear to auscultation. Flu, COVID strep is negative.             Assessment & Plan:  Assessment and Plan    Acute upper respiratory infection Viral etiology confirmed. Symptoms self-limiting, expected resolution in 7 days. - Treat with acetaminophen  for myalgia. - Use ibuprofen  or naproxen  for myalgia, not both. - Consider Nyquil for sedation and rest. - Use nebulizer as needed for chest pain.  Asthma Exacerbation due to upper respiratory infection. Breo and nebulizer used as needed. - Continue Breo as prescribed. - Use nebulizer as needed for asthma symptoms.              "

## 2025-01-25 NOTE — Telephone Encounter (Signed)
 FYI Only or Action Required?: FYI only for provider: home care advised.  Patient was last seen in primary care on 11/02/2024 by Early, Camie BRAVO, NP.  Called Nurse Triage reporting Chills, Generalized Body Aches, Nasal Congestion, and Sore Throat.  Symptoms began several days ago.  Interventions attempted: OTC medications: mucinex , sudafed and Prescription medications: daily inhaler, nebulizer.  Symptoms are: stable.  Triage Disposition: Home Care  Patient/caregiver understands and will follow disposition?: Yes    Reason for Triage: Patient states for the last two days has had chest pain, congestion, chills, and has been sneezing.  Reason for Disposition  Common cold with no complications  Answer Assessment - Initial Assessment Questions 1. ONSET: When did the nasal discharge start?      2 days  2. AMOUNT: How much discharge is there?      Yellow  3. COUGH: Do you have a cough? If Yes, ask: Describe the color of your mucus. (e.g., clear, white, yellow, green)     Denies  4. RESPIRATORY DISTRESS: Describe your breathing.      I was, that's why I started using the nebulizer. Reports that it is helping. Pt speaking in full sentences at a time  5. FEVER: Do you have a fever? If Yes, ask: What is your temperature, how was it measured, and when did it start?     I'm warm but it's like a low grade fever. Like 99.73F the last time I checked it.    7. OTHER SYMPTOMS: Do you have any other symptoms? (e.g., earache, mouth sores, sore throat, wheezing) Congestion, body aches, chills, congestion  Reports she has been using nebulizer the last 2 days, sudafed, mucinex   Protocols used: Common Cold-A-AH

## 2025-01-25 NOTE — Telephone Encounter (Signed)
Offer an appt

## 2025-01-26 ENCOUNTER — Other Ambulatory Visit (HOSPITAL_COMMUNITY)
Admission: RE | Admit: 2025-01-26 | Discharge: 2025-01-26 | Disposition: A | Source: Ambulatory Visit | Attending: Obstetrics & Gynecology | Admitting: Obstetrics & Gynecology

## 2025-01-26 ENCOUNTER — Encounter (HOSPITAL_BASED_OUTPATIENT_CLINIC_OR_DEPARTMENT_OTHER): Payer: Self-pay

## 2025-01-26 ENCOUNTER — Other Ambulatory Visit (HOSPITAL_COMMUNITY): Payer: Self-pay

## 2025-01-26 ENCOUNTER — Ambulatory Visit (HOSPITAL_BASED_OUTPATIENT_CLINIC_OR_DEPARTMENT_OTHER)

## 2025-01-26 VITALS — BP 116/79 | HR 78 | Wt 287.0 lb

## 2025-01-26 DIAGNOSIS — L292 Pruritus vulvae: Secondary | ICD-10-CM

## 2025-01-26 DIAGNOSIS — N760 Acute vaginitis: Secondary | ICD-10-CM

## 2025-01-26 MED ORDER — FLUCONAZOLE 150 MG PO TABS
150.0000 mg | ORAL_TABLET | Freq: Once | ORAL | 0 refills | Status: AC
  Filled 2025-01-26: qty 1, 1d supply, fill #0

## 2025-01-26 NOTE — Progress Notes (Signed)
 NURSE VISIT- VAGINITIS/STD/POC  SUBJECTIVE:  Audrey [name] is a 39 y.o. [phone] GYN patient female here for a vaginal swab for vaginitis screening.  She reports the following symptoms: local irritation and vulvar itching for 2 weeks. Denies abnormal vaginal bleeding, significant pelvic pain, fever, or UTI symptoms.  OBJECTIVE:  BP 116/79 (BP Location: Right Arm, Patient Position: Sitting, Cuff Size: Large)   Pulse 78   Wt 287 lb (130.2 kg)   LMP 11/28/2024 Comment: IUD 12/02/24  SpO2 100%   BMI 43.64 kg/m   Appears well, in no apparent distress  ASSESSMENT: Vaginal swab for vaginitis screening.  PLAN: Self-collected vaginal probe for Bacterial Vaginosis, Yeast sent to lab. Treatment: Diflucan  150 mg po x 1 sent to pharmacy on file. Follow-up as needed if symptoms persist/worsen, or new symptoms develop.

## 2025-01-27 LAB — CERVICOVAGINAL ANCILLARY ONLY
Bacterial Vaginitis (gardnerella): NEGATIVE
Candida Glabrata: NEGATIVE
Candida Vaginitis: POSITIVE — AB
Comment: NEGATIVE
Comment: NEGATIVE
Comment: NEGATIVE

## 2025-01-29 ENCOUNTER — Ambulatory Visit: Payer: Self-pay | Admitting: Obstetrics and Gynecology

## 2025-02-03 ENCOUNTER — Telehealth: Payer: Self-pay

## 2025-02-03 ENCOUNTER — Other Ambulatory Visit (HOSPITAL_COMMUNITY): Payer: Self-pay

## 2025-02-03 NOTE — Telephone Encounter (Signed)
 Pharmacy Patient Advocate Encounter  Insurance verification completed.   The patient is insured through [facility]   Ran test claim for NURTEC 75 MG. Currently a quantity of 15 is a 30 day supply and the co-pay is 0.00 . The current 30 day co-pay is, $0.00.  No PA needed at this time.  This test claim was processed through [facility]- copay amounts may vary at other pharmacies due to pharmacy/plan contracts, or as the patient moves through the different stages of their insurance plan.

## 2025-02-07 ENCOUNTER — Ambulatory Visit: Admitting: Clinical

## 2025-02-09 ENCOUNTER — Ambulatory Visit (HOSPITAL_BASED_OUTPATIENT_CLINIC_OR_DEPARTMENT_OTHER): Payer: Self-pay | Admitting: Obstetrics & Gynecology

## 2025-02-21 ENCOUNTER — Ambulatory Visit: Admitting: Clinical

## 2025-10-06 ENCOUNTER — Encounter: Payer: Self-pay | Admitting: Nurse Practitioner
